# Patient Record
Sex: Female | Born: 1960 | Race: White | Hispanic: No | Marital: Married | Smoking: Current every day smoker
Health system: Southern US, Community
[De-identification: ages and names within clinical notes are randomized; demographics above are authoritative.]

## PROBLEM LIST (undated history)

## (undated) DIAGNOSIS — K219 Gastro-esophageal reflux disease without esophagitis: Secondary | ICD-10-CM

## (undated) DIAGNOSIS — F419 Anxiety disorder, unspecified: Secondary | ICD-10-CM

## (undated) DIAGNOSIS — F329 Major depressive disorder, single episode, unspecified: Secondary | ICD-10-CM

## (undated) DIAGNOSIS — F1010 Alcohol abuse, uncomplicated: Secondary | ICD-10-CM

## (undated) DIAGNOSIS — F32A Depression, unspecified: Secondary | ICD-10-CM

## (undated) DIAGNOSIS — F988 Other specified behavioral and emotional disorders with onset usually occurring in childhood and adolescence: Secondary | ICD-10-CM

## (undated) DIAGNOSIS — Z972 Presence of dental prosthetic device (complete) (partial): Secondary | ICD-10-CM

## (undated) DIAGNOSIS — Z974 Presence of external hearing-aid: Secondary | ICD-10-CM

## (undated) DIAGNOSIS — T17908A Unspecified foreign body in respiratory tract, part unspecified causing other injury, initial encounter: Secondary | ICD-10-CM

## (undated) HISTORY — PX: TUBAL LIGATION: SHX77

## (undated) HISTORY — PX: FOOT SURGERY: SHX648

## (undated) HISTORY — PX: ABDOMINAL HYSTERECTOMY: SHX81

## (undated) HISTORY — PX: TRACHEAL SURGERY: SHX1096

---

## 2013-05-23 ENCOUNTER — Emergency Department: Payer: Self-pay | Admitting: Internal Medicine

## 2013-05-23 ENCOUNTER — Ambulatory Visit: Payer: Self-pay

## 2013-05-23 LAB — COMPREHENSIVE METABOLIC PANEL
ALBUMIN: 3.6 g/dL (ref 3.4–5.0)
ALT: 21 U/L (ref 12–78)
Alkaline Phosphatase: 90 U/L
Anion Gap: 0 — ABNORMAL LOW (ref 7–16)
BILIRUBIN TOTAL: 0.3 mg/dL (ref 0.2–1.0)
BUN: 4 mg/dL — ABNORMAL LOW (ref 7–18)
CALCIUM: 8.6 mg/dL (ref 8.5–10.1)
CO2: 36 mmol/L — AB (ref 21–32)
Chloride: 102 mmol/L (ref 98–107)
Creatinine: 0.74 mg/dL (ref 0.60–1.30)
EGFR (African American): >60
EGFR (Non-African Amer.): >60
GLUCOSE: 79 mg/dL (ref 65–99)
Osmolality: 271 (ref 275–301)
Potassium: 4.3 mmol/L (ref 3.5–5.1)
SGOT(AST): 28 U/L (ref 15–37)
SODIUM: 138 mmol/L (ref 136–145)
TOTAL PROTEIN: 7.2 g/dL (ref 6.4–8.2)

## 2013-05-23 LAB — CK TOTAL AND CKMB (NOT AT ARMC)
CK, Total: 138 U/L
CK-MB: 1.1 ng/mL (ref 0.5–3.6)

## 2013-05-23 LAB — CBC
HCT: 37.5 % (ref 35.0–47.0)
HGB: 12.5 g/dL (ref 12.0–16.0)
MCH: 30.2 pg (ref 26.0–34.0)
MCHC: 33.3 g/dL (ref 32.0–36.0)
MCV: 91 fL (ref 80–100)
Platelet: 186 10*3/uL (ref 150–440)
RBC: 4.14 10*6/uL (ref 3.80–5.20)
RDW: 14.3 % (ref 11.5–14.5)
WBC: 5 10*3/uL (ref 3.6–11.0)

## 2013-07-29 LAB — CBC
HCT: 39 % (ref 35.0–47.0)
HGB: 13.1 g/dL (ref 12.0–16.0)
MCH: 31.9 pg (ref 26.0–34.0)
MCHC: 33.5 g/dL (ref 32.0–36.0)
MCV: 95 fL (ref 80–100)
Platelet: 200 10*3/uL (ref 150–440)
RBC: 4.1 10*6/uL (ref 3.80–5.20)
RDW: 15 % — AB (ref 11.5–14.5)
WBC: 4.5 10*3/uL (ref 3.6–11.0)

## 2013-07-29 LAB — COMPREHENSIVE METABOLIC PANEL
ALBUMIN: 3.8 g/dL (ref 3.4–5.0)
Alkaline Phosphatase: 84 U/L
Anion Gap: 5 — ABNORMAL LOW (ref 7–16)
BILIRUBIN TOTAL: 0.2 mg/dL (ref 0.2–1.0)
BUN: 6 mg/dL — ABNORMAL LOW (ref 7–18)
CALCIUM: 8.3 mg/dL — AB (ref 8.5–10.1)
CO2: 30 mmol/L (ref 21–32)
Chloride: 103 mmol/L (ref 98–107)
Creatinine: 0.67 mg/dL (ref 0.60–1.30)
EGFR (African American): >60
Glucose: 87 mg/dL (ref 65–99)
OSMOLALITY: 273 (ref 275–301)
Potassium: 3.7 mmol/L (ref 3.5–5.1)
SGOT(AST): 35 U/L (ref 15–37)
SGPT (ALT): 18 U/L (ref 12–78)
SODIUM: 138 mmol/L (ref 136–145)
Total Protein: 7.3 g/dL (ref 6.4–8.2)

## 2013-07-29 LAB — DRUG SCREEN, URINE
Amphetamines, Ur Screen: NEGATIVE (ref ?–1000)
BARBITURATES, UR SCREEN: NEGATIVE (ref ?–200)
Benzodiazepine, Ur Scrn: POSITIVE (ref ?–200)
Cannabinoid 50 Ng, Ur ~~LOC~~: NEGATIVE (ref ?–50)
Cocaine Metabolite,Ur ~~LOC~~: NEGATIVE (ref ?–300)
MDMA (Ecstasy)Ur Screen: POSITIVE (ref ?–500)
Methadone, Ur Screen: NEGATIVE (ref ?–300)
Opiate, Ur Screen: NEGATIVE (ref ?–300)
Phencyclidine (PCP) Ur S: NEGATIVE (ref ?–25)
Tricyclic, Ur Screen: NEGATIVE (ref ?–1000)

## 2013-07-29 LAB — URINALYSIS, COMPLETE
Bacteria: NONE SEEN
Bilirubin,UR: NEGATIVE
Blood: NEGATIVE
Glucose,UR: NEGATIVE mg/dL (ref 0–75)
Ketone: NEGATIVE
LEUKOCYTE ESTERASE: NEGATIVE
Nitrite: NEGATIVE
PH: 6 (ref 4.5–8.0)
PROTEIN: NEGATIVE
RBC,UR: <1 /HPF (ref 0–5)
SPECIFIC GRAVITY: 1.001 (ref 1.003–1.030)
Squamous Epithelial: NONE SEEN
WBC UR: NONE SEEN /HPF (ref 0–5)

## 2013-07-29 LAB — SALICYLATE LEVEL: Salicylates, Serum: <1.7 mg/dL

## 2013-07-29 LAB — ETHANOL
ETHANOL LVL: 275 mg/dL
Ethanol %: 0.275 % — ABNORMAL HIGH (ref 0.000–0.080)

## 2013-07-29 LAB — ACETAMINOPHEN LEVEL

## 2013-07-29 LAB — TSH: Thyroid Stimulating Horm: 2.7 u[IU]/mL

## 2013-07-30 ENCOUNTER — Inpatient Hospital Stay: Payer: Self-pay | Admitting: Internal Medicine

## 2013-07-30 LAB — TROPONIN I: Troponin-I: <0.02 ng/mL

## 2013-07-31 LAB — CBC WITH DIFFERENTIAL/PLATELET
BASOS ABS: 0 10*3/uL (ref 0.0–0.1)
Basophil %: 0.2 %
Eosinophil #: 0 10*3/uL (ref 0.0–0.7)
Eosinophil %: 0.1 %
HCT: 37.9 % (ref 35.0–47.0)
HGB: 12.9 g/dL (ref 12.0–16.0)
LYMPHS PCT: 21.1 %
Lymphocyte #: 1.6 10*3/uL (ref 1.0–3.6)
MCH: 32 pg (ref 26.0–34.0)
MCHC: 34.2 g/dL (ref 32.0–36.0)
MCV: 94 fL (ref 80–100)
MONOS PCT: 7.8 %
Monocyte #: 0.6 x10 3/mm (ref 0.2–0.9)
Neutrophil #: 5.4 10*3/uL (ref 1.4–6.5)
Neutrophil %: 70.8 %
PLATELETS: 176 10*3/uL (ref 150–440)
RBC: 4.04 10*6/uL (ref 3.80–5.20)
RDW: 15 % — ABNORMAL HIGH (ref 11.5–14.5)
WBC: 7.6 10*3/uL (ref 3.6–11.0)

## 2013-07-31 LAB — COMPREHENSIVE METABOLIC PANEL
ALK PHOS: 87 U/L
Albumin: 2.9 g/dL — ABNORMAL LOW (ref 3.4–5.0)
Anion Gap: 8 (ref 7–16)
BUN: 7 mg/dL (ref 7–18)
Bilirubin,Total: 0.6 mg/dL (ref 0.2–1.0)
CALCIUM: 8.2 mg/dL — AB (ref 8.5–10.1)
CHLORIDE: 108 mmol/L — AB (ref 98–107)
CREATININE: 0.67 mg/dL (ref 0.60–1.30)
Co2: 24 mmol/L (ref 21–32)
EGFR (African American): >60
EGFR (Non-African Amer.): >60
Glucose: 113 mg/dL — ABNORMAL HIGH (ref 65–99)
Osmolality: 278 (ref 275–301)
POTASSIUM: 3.7 mmol/L (ref 3.5–5.1)
SGOT(AST): 24 U/L (ref 15–37)
SGPT (ALT): 16 U/L (ref 12–78)
Sodium: 140 mmol/L (ref 136–145)
Total Protein: 5.9 g/dL — ABNORMAL LOW (ref 6.4–8.2)

## 2013-07-31 LAB — MAGNESIUM: Magnesium: 1.7 mg/dL — ABNORMAL LOW

## 2013-08-01 LAB — MAGNESIUM: Magnesium: 1.7 mg/dL — ABNORMAL LOW

## 2013-08-01 LAB — PHOSPHORUS: Phosphorus: 2 mg/dL — ABNORMAL LOW (ref 2.5–4.9)

## 2013-08-02 LAB — URINALYSIS, COMPLETE
BLOOD: NEGATIVE
Bacteria: NONE SEEN
Bilirubin,UR: NEGATIVE
Glucose,UR: NEGATIVE mg/dL (ref 0–75)
LEUKOCYTE ESTERASE: NEGATIVE
Nitrite: NEGATIVE
Ph: 6 (ref 4.5–8.0)
Protein: NEGATIVE
Specific Gravity: 1.02 (ref 1.003–1.030)
Squamous Epithelial: NONE SEEN
WBC UR: 1 /HPF (ref 0–5)

## 2013-08-02 LAB — POTASSIUM: POTASSIUM: 2.9 mmol/L — AB (ref 3.5–5.1)

## 2013-08-02 LAB — MAGNESIUM: MAGNESIUM: 1.6 mg/dL — AB

## 2013-08-03 LAB — CBC WITH DIFFERENTIAL/PLATELET
Basophil #: 0 10*3/uL (ref 0.0–0.1)
Basophil %: 0.3 %
EOS ABS: 0 10*3/uL (ref 0.0–0.7)
EOS PCT: 0.6 %
HCT: 33.3 % — AB (ref 35.0–47.0)
HGB: 11.1 g/dL — ABNORMAL LOW (ref 12.0–16.0)
LYMPHS PCT: 12.3 %
Lymphocyte #: 0.8 10*3/uL — ABNORMAL LOW (ref 1.0–3.6)
MCH: 31.1 pg (ref 26.0–34.0)
MCHC: 33.2 g/dL (ref 32.0–36.0)
MCV: 94 fL (ref 80–100)
MONOS PCT: 13 %
Monocyte #: 0.9 x10 3/mm (ref 0.2–0.9)
NEUTROS ABS: 4.9 10*3/uL (ref 1.4–6.5)
NEUTROS PCT: 73.8 %
Platelet: 161 10*3/uL (ref 150–440)
RBC: 3.56 10*6/uL — ABNORMAL LOW (ref 3.80–5.20)
RDW: 14.3 % (ref 11.5–14.5)
WBC: 6.6 10*3/uL (ref 3.6–11.0)

## 2013-08-03 LAB — BASIC METABOLIC PANEL
Anion Gap: 5 — ABNORMAL LOW (ref 7–16)
BUN: 7 mg/dL (ref 7–18)
CALCIUM: 8 mg/dL — AB (ref 8.5–10.1)
CHLORIDE: 106 mmol/L (ref 98–107)
Co2: 25 mmol/L (ref 21–32)
Creatinine: 0.65 mg/dL (ref 0.60–1.30)
EGFR (African American): >60
Glucose: 130 mg/dL — ABNORMAL HIGH (ref 65–99)
Osmolality: 272 (ref 275–301)
Potassium: 3.6 mmol/L (ref 3.5–5.1)
SODIUM: 136 mmol/L (ref 136–145)

## 2013-08-03 LAB — MAGNESIUM: MAGNESIUM: 2 mg/dL

## 2013-08-04 LAB — CBC WITH DIFFERENTIAL/PLATELET
Basophil #: 0 10*3/uL (ref 0.0–0.1)
Basophil %: 0.2 %
EOS ABS: 0.1 10*3/uL (ref 0.0–0.7)
EOS PCT: 1.1 %
HCT: 31.1 % — AB (ref 35.0–47.0)
HGB: 10.4 g/dL — AB (ref 12.0–16.0)
Lymphocyte #: 1 10*3/uL (ref 1.0–3.6)
Lymphocyte %: 15.3 %
MCH: 31.5 pg (ref 26.0–34.0)
MCHC: 33.5 g/dL (ref 32.0–36.0)
MCV: 94 fL (ref 80–100)
MONO ABS: 0.9 x10 3/mm (ref 0.2–0.9)
Monocyte %: 13.2 %
NEUTROS PCT: 70.2 %
Neutrophil #: 4.6 10*3/uL (ref 1.4–6.5)
PLATELETS: 166 10*3/uL (ref 150–440)
RBC: 3.3 10*6/uL — AB (ref 3.80–5.20)
RDW: 14.6 % — AB (ref 11.5–14.5)
WBC: 6.5 10*3/uL (ref 3.6–11.0)

## 2013-08-04 LAB — BASIC METABOLIC PANEL
Anion Gap: 5 — ABNORMAL LOW (ref 7–16)
BUN: 8 mg/dL (ref 7–18)
Calcium, Total: 8.6 mg/dL (ref 8.5–10.1)
Chloride: 104 mmol/L (ref 98–107)
Co2: 26 mmol/L (ref 21–32)
Glucose: 105 mg/dL — ABNORMAL HIGH (ref 65–99)
Osmolality: 269 (ref 275–301)
Potassium: 3.8 mmol/L (ref 3.5–5.1)
SODIUM: 135 mmol/L — AB (ref 136–145)

## 2013-08-04 LAB — CREATININE, SERUM
Creatinine: 0.66 mg/dL (ref 0.60–1.30)
EGFR (Non-African Amer.): >60

## 2013-08-04 LAB — URINE CULTURE

## 2013-08-05 LAB — CBC WITH DIFFERENTIAL/PLATELET
Basophil #: 0 10*3/uL (ref 0.0–0.1)
Basophil %: 0.3 %
Eosinophil #: 0.1 10*3/uL (ref 0.0–0.7)
Eosinophil %: 1.6 %
HCT: 30.7 % — ABNORMAL LOW (ref 35.0–47.0)
HGB: 10.1 g/dL — AB (ref 12.0–16.0)
Lymphocyte #: 1.1 10*3/uL (ref 1.0–3.6)
Lymphocyte %: 18.4 %
MCH: 31.6 pg (ref 26.0–34.0)
MCHC: 33 g/dL (ref 32.0–36.0)
MCV: 96 fL (ref 80–100)
MONO ABS: 0.8 x10 3/mm (ref 0.2–0.9)
MONOS PCT: 14.1 %
Neutrophil #: 3.8 10*3/uL (ref 1.4–6.5)
Neutrophil %: 65.6 %
Platelet: 203 10*3/uL (ref 150–440)
RBC: 3.2 10*6/uL — ABNORMAL LOW (ref 3.80–5.20)
RDW: 14.6 % — ABNORMAL HIGH (ref 11.5–14.5)
WBC: 5.8 10*3/uL (ref 3.6–11.0)

## 2013-08-05 LAB — BASIC METABOLIC PANEL
ANION GAP: 6 — AB (ref 7–16)
BUN: 10 mg/dL (ref 7–18)
Calcium, Total: 9 mg/dL (ref 8.5–10.1)
Chloride: 100 mmol/L (ref 98–107)
Co2: 30 mmol/L (ref 21–32)
Creatinine: 0.57 mg/dL — ABNORMAL LOW (ref 0.60–1.30)
EGFR (Non-African Amer.): >60
GLUCOSE: 107 mg/dL — AB (ref 65–99)
OSMOLALITY: 271 (ref 275–301)
Potassium: 4.2 mmol/L (ref 3.5–5.1)
Sodium: 136 mmol/L (ref 136–145)

## 2013-08-05 LAB — VANCOMYCIN, TROUGH: VANCOMYCIN, TROUGH: 5 ug/mL — AB (ref 10–20)

## 2013-08-06 LAB — EXPECTORATED SPUTUM ASSESSMENT W REFEX TO RESP CULTURE

## 2013-08-07 LAB — CULTURE, BLOOD (SINGLE)

## 2013-08-08 LAB — COMPREHENSIVE METABOLIC PANEL
ALBUMIN: 2.2 g/dL — AB (ref 3.4–5.0)
ANION GAP: 7 (ref 7–16)
Alkaline Phosphatase: 135 U/L — ABNORMAL HIGH
BILIRUBIN TOTAL: 0.3 mg/dL (ref 0.2–1.0)
BUN: 14 mg/dL (ref 7–18)
CHLORIDE: 109 mmol/L — AB (ref 98–107)
CO2: 28 mmol/L (ref 21–32)
Calcium, Total: 8.8 mg/dL (ref 8.5–10.1)
Creatinine: 0.57 mg/dL — ABNORMAL LOW (ref 0.60–1.30)
EGFR (African American): >60
GLUCOSE: 80 mg/dL (ref 65–99)
Osmolality: 286 (ref 275–301)
POTASSIUM: 3.3 mmol/L — AB (ref 3.5–5.1)
SGOT(AST): 33 U/L (ref 15–37)
SGPT (ALT): 31 U/L (ref 12–78)
Sodium: 144 mmol/L (ref 136–145)
TOTAL PROTEIN: 5.7 g/dL — AB (ref 6.4–8.2)

## 2013-08-08 LAB — CBC WITH DIFFERENTIAL/PLATELET
Bands: 3 %
COMMENT - H1-COM3: NORMAL
EOS PCT: 3 %
HCT: 30.6 % — ABNORMAL LOW (ref 35.0–47.0)
HGB: 9.9 g/dL — AB (ref 12.0–16.0)
LYMPHS PCT: 31 %
MCH: 31 pg (ref 26.0–34.0)
MCHC: 32.3 g/dL (ref 32.0–36.0)
MCV: 96 fL (ref 80–100)
METAMYELOCYTE: 4 %
Monocytes: 7 %
Platelet: 273 10*3/uL (ref 150–440)
RBC: 3.19 10*6/uL — AB (ref 3.80–5.20)
RDW: 14.9 % — ABNORMAL HIGH (ref 11.5–14.5)
Segmented Neutrophils: 50 %
VARIANT LYMPHOCYTE - H1-RLYMPH: 2 %
WBC: 6.2 10*3/uL (ref 3.6–11.0)

## 2013-08-09 LAB — CBC WITH DIFFERENTIAL/PLATELET
Bands: 6 %
HCT: 30.7 % — AB (ref 35.0–47.0)
HGB: 10.3 g/dL — AB (ref 12.0–16.0)
LYMPHS PCT: 37 %
MCH: 32 pg (ref 26.0–34.0)
MCHC: 33.4 g/dL (ref 32.0–36.0)
MCV: 96 fL (ref 80–100)
MYELOCYTE: 2 %
Metamyelocyte: 7 %
Monocytes: 4 %
NRBC/100 WBC: 1 /
PLATELETS: 283 10*3/uL (ref 150–440)
RBC: 3.21 10*6/uL — AB (ref 3.80–5.20)
RDW: 14.8 % — AB (ref 11.5–14.5)
Segmented Neutrophils: 44 %
WBC: 6.4 10*3/uL (ref 3.6–11.0)

## 2013-08-09 LAB — BASIC METABOLIC PANEL
ANION GAP: 6 — AB (ref 7–16)
BUN: 13 mg/dL (ref 7–18)
CALCIUM: 8.4 mg/dL — AB (ref 8.5–10.1)
CO2: 25 mmol/L (ref 21–32)
Chloride: 112 mmol/L — ABNORMAL HIGH (ref 98–107)
Creatinine: 0.7 mg/dL (ref 0.60–1.30)
EGFR (African American): >60
EGFR (Non-African Amer.): >60
GLUCOSE: 85 mg/dL (ref 65–99)
Osmolality: 284 (ref 275–301)
POTASSIUM: 3.4 mmol/L — AB (ref 3.5–5.1)
Sodium: 143 mmol/L (ref 136–145)

## 2013-08-10 LAB — BASIC METABOLIC PANEL
Anion Gap: 9 (ref 7–16)
BUN: 18 mg/dL (ref 7–18)
CHLORIDE: 107 mmol/L (ref 98–107)
CREATININE: 0.64 mg/dL (ref 0.60–1.30)
Calcium, Total: 8.7 mg/dL (ref 8.5–10.1)
Co2: 25 mmol/L (ref 21–32)
GLUCOSE: 95 mg/dL (ref 65–99)
Osmolality: 283 (ref 275–301)
Potassium: 3.5 mmol/L (ref 3.5–5.1)
Sodium: 141 mmol/L (ref 136–145)

## 2013-08-10 LAB — PHOSPHORUS: PHOSPHORUS: 3.5 mg/dL (ref 2.5–4.9)

## 2013-08-10 LAB — TRIGLYCERIDES: Triglycerides: 224 mg/dL — ABNORMAL HIGH (ref 0–200)

## 2013-08-10 LAB — MAGNESIUM: MAGNESIUM: 2.1 mg/dL

## 2013-08-11 LAB — PHOSPHORUS: Phosphorus: 3.3 mg/dL (ref 2.5–4.9)

## 2013-08-11 LAB — BASIC METABOLIC PANEL
Anion Gap: 8 (ref 7–16)
BUN: 16 mg/dL (ref 7–18)
Calcium, Total: 8.9 mg/dL (ref 8.5–10.1)
Chloride: 105 mmol/L (ref 98–107)
Co2: 24 mmol/L (ref 21–32)
Creatinine: 0.81 mg/dL (ref 0.60–1.30)
EGFR (African American): >60
EGFR (Non-African Amer.): >60
Glucose: 96 mg/dL (ref 65–99)
Osmolality: 275 (ref 275–301)
Potassium: 3.4 mmol/L — ABNORMAL LOW (ref 3.5–5.1)
Sodium: 137 mmol/L (ref 136–145)

## 2013-08-11 LAB — CBC WITH DIFFERENTIAL/PLATELET
Basophil #: 0 10*3/uL (ref 0.0–0.1)
Basophil %: 0.2 %
Eosinophil #: 0.1 10*3/uL (ref 0.0–0.7)
Eosinophil %: 0.8 %
HCT: 35.7 % (ref 35.0–47.0)
HGB: 11.6 g/dL — ABNORMAL LOW (ref 12.0–16.0)
Lymphocyte #: 1.5 10*3/uL (ref 1.0–3.6)
Lymphocyte %: 15.5 %
MCH: 31 pg (ref 26.0–34.0)
MCHC: 32.6 g/dL (ref 32.0–36.0)
MCV: 95 fL (ref 80–100)
MONOS PCT: 4.9 %
Monocyte #: 0.5 x10 3/mm (ref 0.2–0.9)
NEUTROS PCT: 78.6 %
Neutrophil #: 7.7 10*3/uL — ABNORMAL HIGH (ref 1.4–6.5)
PLATELETS: 333 10*3/uL (ref 150–440)
RBC: 3.74 10*6/uL — AB (ref 3.80–5.20)
RDW: 14.9 % — ABNORMAL HIGH (ref 11.5–14.5)
WBC: 9.8 10*3/uL (ref 3.6–11.0)

## 2013-08-11 LAB — URINALYSIS, COMPLETE
BLOOD: NEGATIVE
Bacteria: NONE SEEN
Bilirubin,UR: NEGATIVE
Glucose,UR: NEGATIVE mg/dL (ref 0–75)
KETONE: NEGATIVE
Leukocyte Esterase: NEGATIVE
Nitrite: NEGATIVE
PROTEIN: NEGATIVE
Ph: 7 (ref 4.5–8.0)
RBC,UR: 1 /HPF (ref 0–5)
Specific Gravity: 1.015 (ref 1.003–1.030)
Squamous Epithelial: <1
WBC UR: 1 /HPF (ref 0–5)

## 2013-08-11 LAB — MAGNESIUM: Magnesium: 2.1 mg/dL

## 2013-08-12 LAB — CBC WITH DIFFERENTIAL/PLATELET
BASOS ABS: 0 10*3/uL (ref 0.0–0.1)
BASOS PCT: 0.4 %
EOS PCT: 1.3 %
Eosinophil #: 0.1 10*3/uL (ref 0.0–0.7)
HCT: 32.9 % — AB (ref 35.0–47.0)
HGB: 11 g/dL — AB (ref 12.0–16.0)
Lymphocyte #: 1.7 10*3/uL (ref 1.0–3.6)
Lymphocyte %: 19.3 %
MCH: 31.7 pg (ref 26.0–34.0)
MCHC: 33.4 g/dL (ref 32.0–36.0)
MCV: 95 fL (ref 80–100)
Monocyte #: 0.6 x10 3/mm (ref 0.2–0.9)
Monocyte %: 6.4 %
Neutrophil #: 6.5 10*3/uL (ref 1.4–6.5)
Neutrophil %: 72.6 %
Platelet: 322 10*3/uL (ref 150–440)
RBC: 3.46 10*6/uL — AB (ref 3.80–5.20)
RDW: 14.9 % — AB (ref 11.5–14.5)
WBC: 8.9 10*3/uL (ref 3.6–11.0)

## 2013-08-12 LAB — BASIC METABOLIC PANEL
Anion Gap: 5 — ABNORMAL LOW (ref 7–16)
BUN: 15 mg/dL (ref 7–18)
CALCIUM: 8.9 mg/dL (ref 8.5–10.1)
CHLORIDE: 107 mmol/L (ref 98–107)
CO2: 24 mmol/L (ref 21–32)
CREATININE: 0.68 mg/dL (ref 0.60–1.30)
GLUCOSE: 85 mg/dL (ref 65–99)
Osmolality: 272 (ref 275–301)
Potassium: 3.6 mmol/L (ref 3.5–5.1)
SODIUM: 136 mmol/L (ref 136–145)

## 2013-08-12 LAB — PHOSPHORUS: PHOSPHORUS: 3.3 mg/dL (ref 2.5–4.9)

## 2013-08-12 LAB — MAGNESIUM: MAGNESIUM: 2 mg/dL

## 2013-08-13 LAB — CBC WITH DIFFERENTIAL/PLATELET
BASOS ABS: 0 10*3/uL (ref 0.0–0.1)
BASOS PCT: 0.4 %
EOS ABS: 0.2 10*3/uL (ref 0.0–0.7)
EOS PCT: 2.5 %
HCT: 31.7 % — AB (ref 35.0–47.0)
HGB: 10.6 g/dL — ABNORMAL LOW (ref 12.0–16.0)
LYMPHS ABS: 1.5 10*3/uL (ref 1.0–3.6)
LYMPHS PCT: 21.1 %
MCH: 31.6 pg (ref 26.0–34.0)
MCHC: 33.6 g/dL (ref 32.0–36.0)
MCV: 94 fL (ref 80–100)
MONO ABS: 0.5 x10 3/mm (ref 0.2–0.9)
MONOS PCT: 6.8 %
NEUTROS PCT: 69.2 %
Neutrophil #: 4.9 10*3/uL (ref 1.4–6.5)
PLATELETS: 317 10*3/uL (ref 150–440)
RBC: 3.37 10*6/uL — AB (ref 3.80–5.20)
RDW: 15 % — ABNORMAL HIGH (ref 11.5–14.5)
WBC: 7.1 10*3/uL (ref 3.6–11.0)

## 2013-08-13 LAB — BASIC METABOLIC PANEL
Anion Gap: 6 — ABNORMAL LOW (ref 7–16)
BUN: 15 mg/dL (ref 7–18)
CALCIUM: 8.5 mg/dL (ref 8.5–10.1)
CHLORIDE: 106 mmol/L (ref 98–107)
Co2: 24 mmol/L (ref 21–32)
Creatinine: 0.69 mg/dL (ref 0.60–1.30)
EGFR (Non-African Amer.): >60
Glucose: 84 mg/dL (ref 65–99)
Osmolality: 272 (ref 275–301)
Potassium: 3.4 mmol/L — ABNORMAL LOW (ref 3.5–5.1)
SODIUM: 136 mmol/L (ref 136–145)

## 2013-08-13 LAB — MAGNESIUM: MAGNESIUM: 1.8 mg/dL

## 2013-08-13 LAB — PHOSPHORUS: PHOSPHORUS: 4.4 mg/dL (ref 2.5–4.9)

## 2013-08-14 LAB — CBC WITH DIFFERENTIAL/PLATELET
BASOS PCT: 0.3 %
Basophil #: 0 10*3/uL (ref 0.0–0.1)
Eosinophil #: 0.1 10*3/uL (ref 0.0–0.7)
Eosinophil %: 2 %
HCT: 30.4 % — ABNORMAL LOW (ref 35.0–47.0)
HGB: 10.5 g/dL — ABNORMAL LOW (ref 12.0–16.0)
LYMPHS PCT: 25.8 %
Lymphocyte #: 1.6 10*3/uL (ref 1.0–3.6)
MCH: 32.1 pg (ref 26.0–34.0)
MCHC: 34.5 g/dL (ref 32.0–36.0)
MCV: 93 fL (ref 80–100)
MONO ABS: 0.5 x10 3/mm (ref 0.2–0.9)
Monocyte %: 7.9 %
Neutrophil #: 3.8 10*3/uL (ref 1.4–6.5)
Neutrophil %: 64 %
Platelet: 330 10*3/uL (ref 150–440)
RBC: 3.26 10*6/uL — ABNORMAL LOW (ref 3.80–5.20)
RDW: 14.7 % — ABNORMAL HIGH (ref 11.5–14.5)
WBC: 6 10*3/uL (ref 3.6–11.0)

## 2013-08-14 LAB — BASIC METABOLIC PANEL
Anion Gap: 10 (ref 7–16)
BUN: 12 mg/dL (ref 7–18)
Calcium, Total: 8.5 mg/dL (ref 8.5–10.1)
Chloride: 103 mmol/L (ref 98–107)
Co2: 24 mmol/L (ref 21–32)
Creatinine: 0.41 mg/dL — ABNORMAL LOW (ref 0.60–1.30)
EGFR (African American): >60
EGFR (Non-African Amer.): >60
Glucose: 109 mg/dL — ABNORMAL HIGH (ref 65–99)
Osmolality: 274 (ref 275–301)
POTASSIUM: 3.3 mmol/L — AB (ref 3.5–5.1)
Sodium: 137 mmol/L (ref 136–145)

## 2013-08-14 LAB — PHOSPHORUS: Phosphorus: 3.4 mg/dL (ref 2.5–4.9)

## 2013-08-14 LAB — MAGNESIUM: Magnesium: 1.9 mg/dL

## 2013-08-15 LAB — BASIC METABOLIC PANEL
Anion Gap: 9 (ref 7–16)
BUN: 10 mg/dL (ref 7–18)
CHLORIDE: 105 mmol/L (ref 98–107)
Calcium, Total: 8.8 mg/dL (ref 8.5–10.1)
Co2: 23 mmol/L (ref 21–32)
Creatinine: 0.51 mg/dL — ABNORMAL LOW (ref 0.60–1.30)
Glucose: 109 mg/dL — ABNORMAL HIGH (ref 65–99)
Osmolality: 273 (ref 275–301)
Potassium: 3.3 mmol/L — ABNORMAL LOW (ref 3.5–5.1)
SODIUM: 137 mmol/L (ref 136–145)

## 2013-08-15 LAB — POTASSIUM: Potassium: 3.8 mmol/L (ref 3.5–5.1)

## 2013-08-15 LAB — MAGNESIUM: Magnesium: 1.9 mg/dL

## 2013-08-16 ENCOUNTER — Ambulatory Visit (HOSPITAL_COMMUNITY)
Admission: AD | Admit: 2013-08-16 | Discharge: 2013-08-16 | Disposition: A | Discharge status: Home / Self Care | Payer: Self-pay | Source: Other Acute Inpatient Hospital | Attending: Internal Medicine | Admitting: Internal Medicine

## 2013-08-16 ENCOUNTER — Inpatient Hospital Stay
Admission: AD | Admit: 2013-08-16 | Discharge: 2013-09-03 | Disposition: A | Discharge status: Home / Self Care | Payer: Self-pay | Source: Ambulatory Visit | Attending: Internal Medicine | Admitting: Internal Medicine

## 2013-08-16 DIAGNOSIS — F1020 Alcohol dependence, uncomplicated: Secondary | ICD-10-CM | POA: Insufficient documentation

## 2013-08-16 DIAGNOSIS — J9600 Acute respiratory failure, unspecified whether with hypoxia or hypercapnia: Secondary | ICD-10-CM | POA: Insufficient documentation

## 2013-08-16 LAB — PHOSPHORUS: Phosphorus: 3.7 mg/dL (ref 2.5–4.9)

## 2013-08-16 LAB — BASIC METABOLIC PANEL
Anion Gap: 8 (ref 7–16)
BUN: 12 mg/dL (ref 7–18)
CALCIUM: 9.2 mg/dL (ref 8.5–10.1)
CHLORIDE: 104 mmol/L (ref 98–107)
CREATININE: 0.6 mg/dL (ref 0.60–1.30)
Co2: 23 mmol/L (ref 21–32)
Glucose: 87 mg/dL (ref 65–99)
Osmolality: 269 (ref 275–301)
POTASSIUM: 3.5 mmol/L (ref 3.5–5.1)
SODIUM: 135 mmol/L — AB (ref 136–145)

## 2013-08-16 LAB — CBC WITH DIFFERENTIAL/PLATELET
BASOS PCT: 1.5 %
Basophil #: 0.2 10*3/uL — ABNORMAL HIGH (ref 0.0–0.1)
Eosinophil #: 0.1 10*3/uL (ref 0.0–0.7)
Eosinophil %: 0.6 %
HCT: 35.9 % (ref 35.0–47.0)
HGB: 12.1 g/dL (ref 12.0–16.0)
LYMPHS PCT: 14 %
Lymphocyte #: 1.5 10*3/uL (ref 1.0–3.6)
MCH: 31.5 pg (ref 26.0–34.0)
MCHC: 33.6 g/dL (ref 32.0–36.0)
MCV: 94 fL (ref 80–100)
Monocyte #: 0.7 x10 3/mm (ref 0.2–0.9)
Monocyte %: 7 %
NEUTROS PCT: 76.9 %
Neutrophil #: 8 10*3/uL — ABNORMAL HIGH (ref 1.4–6.5)
Platelet: 448 10*3/uL — ABNORMAL HIGH (ref 150–440)
RBC: 3.84 10*6/uL (ref 3.80–5.20)
RDW: 14.7 % — ABNORMAL HIGH (ref 11.5–14.5)
WBC: 10.4 10*3/uL (ref 3.6–11.0)

## 2013-08-16 LAB — TRIGLYCERIDES: Triglycerides: 197 mg/dL (ref 0–200)

## 2013-08-16 LAB — MAGNESIUM: Magnesium: 1.8 mg/dL

## 2013-08-17 ENCOUNTER — Other Ambulatory Visit (HOSPITAL_COMMUNITY): Payer: Self-pay

## 2013-08-17 LAB — COMPREHENSIVE METABOLIC PANEL
ALBUMIN: 3.2 g/dL — AB (ref 3.5–5.2)
ALK PHOS: 196 U/L — AB (ref 39–117)
ALT: 61 U/L — AB (ref 0–35)
AST: 90 U/L — ABNORMAL HIGH (ref 0–37)
BILIRUBIN TOTAL: 0.3 mg/dL (ref 0.3–1.2)
BUN: 13 mg/dL (ref 6–23)
CHLORIDE: 96 meq/L (ref 96–112)
CO2: 24 mEq/L (ref 19–32)
Calcium: 10.1 mg/dL (ref 8.4–10.5)
Creatinine, Ser: 0.51 mg/dL (ref 0.50–1.10)
GFR calc Af Amer: >90 mL/min (ref >90)
GFR calc non Af Amer: >90 mL/min (ref >90)
Glucose, Bld: 98 mg/dL (ref 70–99)
POTASSIUM: 3.9 meq/L (ref 3.7–5.3)
SODIUM: 137 meq/L (ref 137–147)
Total Protein: 7.9 g/dL (ref 6.0–8.3)

## 2013-08-17 LAB — HEMOGLOBIN A1C
Hgb A1c MFr Bld: 5.2 % (ref <5.7)
Mean Plasma Glucose: 103 mg/dL (ref <117)

## 2013-08-17 LAB — BLOOD GAS, ARTERIAL
Acid-Base Excess: 0.9 mmol/L (ref 0.0–2.0)
Bicarbonate: 24.6 mEq/L — ABNORMAL HIGH (ref 20.0–24.0)
FIO2: 30 %
O2 Saturation: 99.7 %
PEEP: 5 cmH2O
PH ART: 7.455 — AB (ref 7.350–7.450)
Patient temperature: 97.7
RATE: 12 resp/min
TCO2: 25.7 mmol/L (ref 0–100)
VT: 500 mL
pCO2 arterial: 35.2 mmHg (ref 35.0–45.0)
pO2, Arterial: 141 mmHg — ABNORMAL HIGH (ref 80.0–100.0)

## 2013-08-17 LAB — TSH: TSH: 1.67 u[IU]/mL (ref 0.350–4.500)

## 2013-08-17 LAB — CBC WITH DIFFERENTIAL/PLATELET
BASOS ABS: 0 10*3/uL (ref 0.0–0.1)
BASOS PCT: 0 % (ref 0–1)
Eosinophils Absolute: 0.1 10*3/uL (ref 0.0–0.7)
Eosinophils Relative: 1 % (ref 0–5)
HCT: 33.3 % — ABNORMAL LOW (ref 36.0–46.0)
Hemoglobin: 11.1 g/dL — ABNORMAL LOW (ref 12.0–15.0)
Lymphocytes Relative: 22 % (ref 12–46)
Lymphs Abs: 1.9 10*3/uL (ref 0.7–4.0)
MCH: 30.8 pg (ref 26.0–34.0)
MCHC: 33.3 g/dL (ref 30.0–36.0)
MCV: 92.5 fL (ref 78.0–100.0)
Monocytes Absolute: 0.8 10*3/uL (ref 0.1–1.0)
Monocytes Relative: 9 % (ref 3–12)
NEUTROS ABS: 6.2 10*3/uL (ref 1.7–7.7)
NEUTROS PCT: 68 % (ref 43–77)
Platelets: 447 10*3/uL — ABNORMAL HIGH (ref 150–400)
RBC: 3.6 MIL/uL — ABNORMAL LOW (ref 3.87–5.11)
RDW: 13.9 % (ref 11.5–15.5)
WBC: 9 10*3/uL (ref 4.0–10.5)

## 2013-08-17 LAB — PHOSPHORUS: Phosphorus: 4.2 mg/dL (ref 2.3–4.6)

## 2013-08-17 LAB — PROCALCITONIN

## 2013-08-17 LAB — MAGNESIUM: MAGNESIUM: 2.1 mg/dL (ref 1.5–2.5)

## 2013-08-17 LAB — GAMMA GT: GGT: 195 U/L — ABNORMAL HIGH (ref 7–51)

## 2013-08-17 LAB — VITAMIN B12: Vitamin B-12: 697 pg/mL (ref 211–911)

## 2013-08-18 LAB — C-REACTIVE PROTEIN: CRP: 9.8 mg/dL — AB (ref <0.60)

## 2013-08-18 LAB — IRON AND TIBC
Iron: 44 ug/dL (ref 42–135)
Saturation Ratios: 16 % — ABNORMAL LOW (ref 20–55)
TIBC: 271 ug/dL (ref 250–470)
UIBC: 227 ug/dL (ref 125–400)

## 2013-08-19 ENCOUNTER — Other Ambulatory Visit (HOSPITAL_COMMUNITY): Payer: Self-pay

## 2013-08-19 NOTE — Progress Notes (Signed)
[facility]                                                                                              Progress note     Patient Demographics  Patricia [name], is a 53 y.o. female  ZOX:[id number]  WJX:[id number]  DOB - 10/24/1960  Admit date - 08/16/2013  Admitting Physician Marnette BurgessEarl Webster, MD  Outpatient Primary MD for the patient is No PCP Per Patient  LOS - 3   Chief complaint   Respiratory failure   Alcoholism     Subjective:   Skya Crumpacker confused  Objective:   Vital signs  Temperature 99.2 Heart rate 94 Respiratory rate 14 Blood pressure 84/47 Pulse ox 99%    Exam Drowsy  .AT,PERRAL Supple Neck,No JVD, No cervical lymphadenopathy appreciated. Tracheostomy midline  NG tube noted Symmetrical Chest wall movement, decreased breath sounds bilaterally RRR,No Gallops,Rubs or new Murmurs, No Parasternal Heave . +ve B.Sounds, Abd Soft, Non tender, No organomegaly appriciated, No rebound - guarding or rigidity. No Cyanosis, Clubbing or edema, No new Rash or bruise     I&Os 2575/97.5   Data Review   CBC  Recent Labs Lab 08/17/13 0600  WBC 9.0  HGB 11.1*  HCT 33.3*  PLT 447*  MCV 92.5  MCH 30.8  MCHC 33.3  RDW 13.9  LYMPHSABS 1.9  MONOABS 0.8  EOSABS 0.1  BASOSABS 0.0    Chemistries   Recent Labs Lab 08/17/13 0600  NA 137  K 3.9  CL 96  CO2 24  GLUCOSE 98  BUN 13  CREATININE 0.51  CALCIUM 10.1  MG 2.1  AST 90*  ALT 61*  ALKPHOS 196*  BILITOT 0.3   ------------------------------------------------------------------------------------------------------------------ CrCl is unknown because there is no height on file for the current visit. ------------------------------------------------------------------------------------------------------------------  Recent Labs  08/17/13 0600  HGBA1C 5.2    ------------------------------------------------------------------------------------------------------------------ No results found for this basename: CHOL, HDL, LDLCALC, TRIG, CHOLHDL, LDLDIRECT,  in the last 72 hours ------------------------------------------------------------------------------------------------------------------  Recent Labs  08/17/13 0600  TSH 1.670   ------------------------------------------------------------------------------------------------------------------  Recent Labs  08/17/13 0600  VITAMINB12 697  TIBC 271  IRON 44    Coagulation profile No results found for this basename: INR, PROTIME,  in the last 168 hours  No results found for this basename: DDIMER,  in the last 72 hours  Cardiac Enzymes No results found for this basename: CK, CKMB, TROPONINI, MYOGLOBIN,  in the last 168 hours ------------------------------------------------------------------------------------------------------------------ No components found with this basename: POCBNP,   Micro Results No results found for this or any previous visit (from the past 240 hour(s)).     Assessment & Plan    Ventilator dependent Respiratory failure; continue with PSV trials Encephalopathy multifactorial, with agitation, history of polysubstance abuse Tachycardia on Lopressor when necessary and scheduled Low-grade fever; will monitor check chest x-ray in a.m. Dysphagia/protein calorie malnutrition on NG tube feeding Generalized weakness; PT/OT evaluation and treatment Chronic pain syndrome on pain medications Polysubstance abuse especially alcohol Anxiety and depression History of aspiration pneumonia/MSSA status post treatment  Plan  Hold fentanyl drip Fentanyl when necessary Check chest x-ray in a.m.  Continue same treatment   Code Status: Full  Family Communication: Bedside   DVT Prophylaxis  Lovenox    Carron CurieHijazi, Ali M.D on 08/19/2013 at 1:39 PM

## 2013-08-20 ENCOUNTER — Other Ambulatory Visit (HOSPITAL_COMMUNITY): Payer: Self-pay

## 2013-08-20 NOTE — Progress Notes (Addendum)
[facility]                                                                                              Progress note     Patient Demographics  Patricia [name], is a 53 y.o. female  ZOX:[id number]  WJX:[id number]  DOB - 06/20/1960  Admit date - 08/16/2013  Admitting Physician Patricia BurgessEarl Webster, MD  Outpatient Primary MD for the patient is No PCP Per Patient  LOS - 4   Chief complaint   Respiratory failure   Alcoholism     Subjective:   Patricia [name] confused  Objective:   Vital signs  Temperature  98.1  Heart rate  113  Respiratory rate  17  Blood pressure  142/98  Pulse ox 99%    Exam Drowsy  Patricia [name].AT,PERRAL Supple Neck,No JVD, No cervical lymphadenopathy appreciated. Tracheostomy midline  NG tube noted Symmetrical Chest wall movement, decreased breath sounds bilaterally RRR,No Gallops,Rubs or new Murmurs, No Parasternal Heave . +ve B.Sounds, Abd Soft, Non tender, No organomegaly appriciated, No rebound - guarding or rigidity. No Cyanosis, Clubbing or edema, No new Rash or bruise     I&Os  -650    Data Review   CBC  Recent Labs Lab 08/17/13 0600  WBC 9.0  HGB 11.1*  HCT 33.3*  PLT 447*  MCV 92.5  MCH 30.8  MCHC 33.3  RDW 13.9  LYMPHSABS 1.9  MONOABS 0.8  EOSABS 0.1  BASOSABS 0.0    Chemistries   Recent Labs Lab 08/17/13 0600  NA 137  K 3.9  CL 96  CO2 24  GLUCOSE 98  BUN 13  CREATININE 0.51  CALCIUM 10.1  MG 2.1  AST 90*  ALT 61*  ALKPHOS 196*  BILITOT 0.3   ------------------------------------------------------------------------------------------------------------------ CrCl is unknown because there is no height on file for the current visit. ------------------------------------------------------------------------------------------------------------------ No results found for this basename: HGBA1C,  in the last 72  hours ------------------------------------------------------------------------------------------------------------------ No results found for this basename: CHOL, HDL, LDLCALC, TRIG, CHOLHDL, LDLDIRECT,  in the last 72 hours ------------------------------------------------------------------------------------------------------------------ No results found for this basename: TSH, T4TOTAL, FREET3, T3FREE, THYROIDAB,  in the last 72 hours ------------------------------------------------------------------------------------------------------------------ No results found for this basename: VITAMINB12, FOLATE, FERRITIN, TIBC, IRON, RETICCTPCT,  in the last 72 hours  Coagulation profile No results found for this basename: INR, PROTIME,  in the last 168 hours  No results found for this basename: DDIMER,  in the last 72 hours  Cardiac Enzymes No results found for this basename: CK, CKMB, TROPONINI, MYOGLOBIN,  in the last 168 hours ------------------------------------------------------------------------------------------------------------------ No components found with this basename: POCBNP,   Micro Results No results found for this or any previous visit (from the past 240 hour(s)).     Assessment & Plan    Ventilator dependent Respiratory failure; continue with PSV trials Encephalopathy multifactorial, with agitation, history of polysubstance abuse Tachycardia on Lopressor when necessary and scheduled Low-grade fever; will monitor check chest x-ray in a.m. Dysphagia/protein calorie malnutrition on NG tube feeding Generalized weakness; PT/OT evaluation and treatment Chronic pain syndrome on pain medications Polysubstance abuse especially alcohol Anxiety and depression with agitation  on fentanyl, Versed and Haldol when necessary History of aspiration pneumonia/MSSA status post treatment  Plan  Discontinue Abilify Start Seroquel 50 mg each bedtime and 25 mg during the day Check labs ,  chest x-ray in the morning  Code Status: Full  Family Communication: Bedside   DVT Prophylaxis  Lovenox    Carron CurieHijazi, Aneesha Holloran M.D on 08/20/2013 at 1:16 PM

## 2013-08-21 LAB — CULTURE, BLOOD (SINGLE)

## 2013-08-21 NOTE — Progress Notes (Addendum)
[facility]                                                                                              Progress note     Patient Demographics  Patricia [name], is a 53 y.o. female  ZOX:[id number]  WJX:[id number]  DOB - 08/17/1960  Admit date - 08/16/2013  Admitting Physician Marnette BurgessEarl Webster, MD  Outpatient Primary MD for the patient is No PCP Per Patient  LOS - 5   Chief complaint   Respiratory failure   Alcoholism     Subjective:   Patricia [name] confused  Objective:   Vital signs  Temperature  96.3  Heart rate  87  Respiratory rate  15  Blood pressure  120/72 Pulse ox 100%    Exam Drowsy  [place].AT,PERRAL Supple Neck,No JVD, No cervical lymphadenopathy appreciated. Tracheostomy midline  NG tube noted Symmetrical Chest wall movement, decreased breath sounds bilaterally RRR,No Gallops,Rubs or new Murmurs, No Parasternal Heave . +ve B.Sounds, Abd Soft, Non tender, No organomegaly appriciated, No rebound - guarding or rigidity. No Cyanosis, Clubbing or edema, No new Rash or bruise     I&Os  2310/850    Data Review   CBC  Recent Labs Lab 08/17/13 0600  WBC 9.0  HGB 11.1*  HCT 33.3*  PLT 447*  MCV 92.5  MCH 30.8  MCHC 33.3  RDW 13.9  LYMPHSABS 1.9  MONOABS 0.8  EOSABS 0.1  BASOSABS 0.0    Chemistries   Recent Labs Lab 08/17/13 0600  NA 137  K 3.9  CL 96  CO2 24  GLUCOSE 98  BUN 13  CREATININE 0.51  CALCIUM 10.1  MG 2.1  AST 90*  ALT 61*  ALKPHOS 196*  BILITOT 0.3   ------------------------------------------------------------------------------------------------------------------ CrCl is unknown because there is no height on file for the current visit. ------------------------------------------------------------------------------------------------------------------ No results found for this basename: HGBA1C,  in the last 72  hours ------------------------------------------------------------------------------------------------------------------ No results found for this basename: CHOL, HDL, LDLCALC, TRIG, CHOLHDL, LDLDIRECT,  in the last 72 hours ------------------------------------------------------------------------------------------------------------------ No results found for this basename: TSH, T4TOTAL, FREET3, T3FREE, THYROIDAB,  in the last 72 hours ------------------------------------------------------------------------------------------------------------------ No results found for this basename: VITAMINB12, FOLATE, FERRITIN, TIBC, IRON, RETICCTPCT,  in the last 72 hours  Coagulation profile No results found for this basename: INR, PROTIME,  in the last 168 hours  No results found for this basename: DDIMER,  in the last 72 hours  Cardiac Enzymes No results found for this basename: CK, CKMB, TROPONINI, MYOGLOBIN,  in the last 168 hours ------------------------------------------------------------------------------------------------------------------ No components found with this basename: POCBNP,   Micro Results No results found for this or any previous visit (from the past 240 hour(s)).     Assessment & Plan    Ventilator dependent Respiratory failure; continue with PSV trials Encephalopathy multifactorial, with agitation, history of polysubstance abuse Tachycardia on Lopressor when necessary and scheduled Low-grade fever; will monitor check chest x-ray in a.m. Dysphagia/protein calorie malnutrition on NG tube feeding Generalized weakness; PT/OT evaluation and treatment Chronic pain syndrome on pain medications Polysubstance abuse especially alcohol Anxiety and depression with agitation on  fentanyl, Versed and Haldol when necessary History of aspiration pneumonia/MSSA status post treatment  Plan  Check labs chest x-ray in a.m. Continue with weaning trials Ventilator management  today Critical care time; 37 minutes Code Status: Full  Family Communication: Bedside   DVT Prophylaxis  Lovenox    Carron CurieHijazi, Ali M.D on 08/21/2013 at 1:08 PM

## 2013-08-22 ENCOUNTER — Other Ambulatory Visit (HOSPITAL_COMMUNITY): Payer: Self-pay

## 2013-08-22 LAB — CBC
HCT: 31.4 % — ABNORMAL LOW (ref 36.0–46.0)
Hemoglobin: 10.1 g/dL — ABNORMAL LOW (ref 12.0–15.0)
MCH: 30.9 pg (ref 26.0–34.0)
MCHC: 32.2 g/dL (ref 30.0–36.0)
MCV: 96 fL (ref 78.0–100.0)
Platelets: 283 10*3/uL (ref 150–400)
RBC: 3.27 MIL/uL — ABNORMAL LOW (ref 3.87–5.11)
RDW: 13.6 % (ref 11.5–15.5)
WBC: 7.7 10*3/uL (ref 4.0–10.5)

## 2013-08-22 LAB — BASIC METABOLIC PANEL
BUN: 11 mg/dL (ref 6–23)
CO2: 24 mEq/L (ref 19–32)
CREATININE: 0.37 mg/dL — AB (ref 0.50–1.10)
Calcium: 8.1 mg/dL — ABNORMAL LOW (ref 8.4–10.5)
Chloride: 103 mEq/L (ref 96–112)
GFR calc Af Amer: >90 mL/min (ref >90)
Glucose, Bld: 99 mg/dL (ref 70–99)
Potassium: 2.7 mEq/L — CL (ref 3.7–5.3)
SODIUM: 141 meq/L (ref 137–147)

## 2013-08-22 NOTE — Progress Notes (Signed)
[facility]                                                                                              Progress note     Patient Demographics  Patricia [name], is a 53 y.o. female  ZOX:[id number]  WJX:[id number]  DOB - 09/08/1960  Admit date - 08/16/2013  Admitting Physician Marnette BurgessEarl Webster, MD  Outpatient Primary MD for the patient is No PCP Per Patient  LOS - 6   Chief complaint   Respiratory failure   Alcoholism     Subjective:   Sandeep Deihl confused  Objective:   Vital signs  Temperature  97.9 Heart rate 71  Respiratory rate  17  Blood pressure  111/71 Pulse ox 95%    Exam Drowsy  [place].AT,PERRAL Supple Neck,No JVD, No cervical lymphadenopathy appreciated. Tracheostomy midline  NG tube noted Symmetrical Chest wall movement, decreased breath sounds bilaterally RRR,No Gallops,Rubs or new Murmurs, No Parasternal Heave . +ve B.Sounds, Abd Soft, Non tender, No organomegaly appriciated, No rebound - guarding or rigidity. No Cyanosis, Clubbing or edema, No new Rash or bruise     I&Os  1865/800    Data Review   CBC  Recent Labs Lab 08/17/13 0600 08/22/13 0550  WBC 9.0 7.7  HGB 11.1* 10.1*  HCT 33.3* 31.4*  PLT 447* 283  MCV 92.5 96.0  MCH 30.8 30.9  MCHC 33.3 32.2  RDW 13.9 13.6  LYMPHSABS 1.9  --   MONOABS 0.8  --   EOSABS 0.1  --   BASOSABS 0.0  --     Chemistries   Recent Labs Lab 08/17/13 0600 08/22/13 0550  NA 137 141  K 3.9 2.7*  CL 96 103  CO2 24 24  GLUCOSE 98 99  BUN 13 11  CREATININE 0.51 0.37*  CALCIUM 10.1 8.1*  MG 2.1  --   AST 90*  --   ALT 61*  --   ALKPHOS 196*  --   BILITOT 0.3  --    ------------------------------------------------------------------------------------------------------------------ CrCl is unknown because there is no height on file for the current  visit. ------------------------------------------------------------------------------------------------------------------ No results found for this basename: HGBA1C,  in the last 72 hours ------------------------------------------------------------------------------------------------------------------ No results found for this basename: CHOL, HDL, LDLCALC, TRIG, CHOLHDL, LDLDIRECT,  in the last 72 hours ------------------------------------------------------------------------------------------------------------------ No results found for this basename: TSH, T4TOTAL, FREET3, T3FREE, THYROIDAB,  in the last 72 hours ------------------------------------------------------------------------------------------------------------------ No results found for this basename: VITAMINB12, FOLATE, FERRITIN, TIBC, IRON, RETICCTPCT,  in the last 72 hours  Coagulation profile No results found for this basename: INR, PROTIME,  in the last 168 hours  No results found for this basename: DDIMER,  in the last 72 hours  Cardiac Enzymes No results found for this basename: CK, CKMB, TROPONINI, MYOGLOBIN,  in the last 168 hours ------------------------------------------------------------------------------------------------------------------ No components found with this basename: POCBNP,   Micro Results No results found for this or any previous visit (from the past 240 hour(s)).     Assessment & Plan    Ventilator dependent Respiratory failure; continue with weaning trials Encephalopathy multifactorial, with agitation, history of polysubstance abuse  Tachycardia on Lopressor when necessary and scheduled Low-grade fever; will monitor check chest x-ray in a.m. Dysphagia/protein calorie malnutrition on NG tube feeding Generalized weakness; PT/OT evaluation and treatment Chronic pain syndrome on pain medications per Dr Welton FlakesKhan Polysubstance abuse especially alcohol Anxiety and depression with agitation on fentanyl,  Versed and Haldol when necessary History of aspiration pneumonia/MSSA status post treatment Hypokalemia Repleted  Plan  ATC trials today Continue same Rx Code Status: Full  Family Communication: Bedside   DVT Prophylaxis  Lovenox    Carron CurieHijazi, Ali M.D on 08/22/2013 at 11:16 AM

## 2013-08-23 LAB — BASIC METABOLIC PANEL
BUN: 10 mg/dL (ref 6–23)
CO2: 27 mEq/L (ref 19–32)
Calcium: 10.3 mg/dL (ref 8.4–10.5)
Chloride: 98 mEq/L (ref 96–112)
Creatinine, Ser: 0.47 mg/dL — ABNORMAL LOW (ref 0.50–1.10)
Glucose, Bld: 108 mg/dL — ABNORMAL HIGH (ref 70–99)
POTASSIUM: 4.6 meq/L (ref 3.7–5.3)
SODIUM: 140 meq/L (ref 137–147)

## 2013-08-23 NOTE — Progress Notes (Addendum)
[facility]                                                                                              Progress note     Patient Demographics  Patricia [name], is a 53 y.o. female  UJW:[id number]  [id number]  DOB - 01/11/1961  Admit date - 08/16/2013  Admitting Physician Patricia BurgessEarl Webster, MD  Outpatient Primary MD for the patient is No PCP Per Patient  LOS - 7   Chief complaint   Respiratory failure   Alcoholism     Subjective:   Patricia [name] confused, cannot give significant history  Objective:   Vital signs  Temperature  98 Heart rate 85  Respiratory rate  20  Blood pressure  115/76 Pulse ox 99%    Exam Drowsy  [place].AT,PERRAL Supple Neck,No JVD, No cervical lymphadenopathy appreciated. Tracheostomy midline  NG tube noted Symmetrical Chest wall movement, decreased breath sounds bilaterally RRR,No Gallops,Rubs or new Murmurs, No Parasternal Heave . +ve B.Sounds, Abd Soft, Non tender, No organomegaly appriciated, No rebound - guarding or rigidity. No Cyanosis, Clubbing or edema, No new Rash or bruise     I&Os  unknown    Data Review   CBC  Recent Labs Lab 08/17/13 0600 08/22/13 0550  WBC 9.0 7.7  HGB 11.1* 10.1*  HCT 33.3* 31.4*  PLT 447* 283  MCV 92.5 96.0  MCH 30.8 30.9  MCHC 33.3 32.2  RDW 13.9 13.6  LYMPHSABS 1.9  --   MONOABS 0.8  --   EOSABS 0.1  --   BASOSABS 0.0  --     Chemistries   Recent Labs Lab 08/17/13 0600 08/22/13 0550  NA 137 141  K 3.9 2.7*  CL 96 103  CO2 24 24  GLUCOSE 98 99  BUN 13 11  CREATININE 0.51 0.37*  CALCIUM 10.1 8.1*  MG 2.1  --   AST 90*  --   ALT 61*  --   ALKPHOS 196*  --   BILITOT 0.3  --    ------------------------------------------------------------------------------------------------------------------ CrCl is unknown because there is no height on file for the current  visit. ------------------------------------------------------------------------------------------------------------------ No results found for this basename: HGBA1C,  in the last 72 hours ------------------------------------------------------------------------------------------------------------------ No results found for this basename: CHOL, HDL, LDLCALC, TRIG, CHOLHDL, LDLDIRECT,  in the last 72 hours ------------------------------------------------------------------------------------------------------------------ No results found for this basename: TSH, T4TOTAL, FREET3, T3FREE, THYROIDAB,  in the last 72 hours ------------------------------------------------------------------------------------------------------------------ No results found for this basename: VITAMINB12, FOLATE, FERRITIN, TIBC, IRON, RETICCTPCT,  in the last 72 hours  Coagulation profile No results found for this basename: INR, PROTIME,  in the last 168 hours  No results found for this basename: DDIMER,  in the last 72 hours  Cardiac Enzymes No results found for this basename: CK, CKMB, TROPONINI, MYOGLOBIN,  in the last 168 hours ------------------------------------------------------------------------------------------------------------------ No components found with this basename: POCBNP,   Micro Results No results found for this or any previous visit (from the past 240 hour(s)).     Assessment & Plan    Ventilator dependent Respiratory failure; continue with weaning trials Encephalopathy multifactorial, with agitation,  history of polysubstance abuse Tachycardia on Lopressor when necessary and scheduled Low-grade fever; will monitor check chest x-ray in a.m. Dysphagia/protein calorie malnutrition on NG tube feeding Generalized weakness; PT/OT evaluation and treatment Chronic pain syndrome on pain medications per Dr Welton FlakesKhan Polysubstance abuse especially alcohol Anxiety and depression with agitation on fentanyl,  Versed and Haldol when necessary History of aspiration pneumonia/MSSA status post treatment Hypokalemia Repleted  Plan  Continue with ATC trials PT/OT Critical care time 32 minutes Code Status: Full  Family Communication: Bedside   DVT Prophylaxis  Lovenox    Carron CurieHijazi, Patricia [name] Patricia [name] on 08/23/2013 at 11:28 AM

## 2013-08-24 NOTE — Progress Notes (Addendum)
[facility]                                                                                              Progress note     Patient Demographics  Patricia [name], is a 53 y.o. female  [id number]  [id number]  DOB - 03/29/1960  Admit date - 08/16/2013  Admitting Physician Marnette BurgessEarl Webster, MD  Outpatient Primary MD for the patient is No PCP Per Patient  LOS - 8   Chief complaint   Respiratory failure   Alcoholism     Subjective:   Patricia [name] confused, cannot give significant history  Objective:   Vital signs  Temperature  97.6 Heart rate 77  Respiratory rate  20  Blood pressure  127/68 Pulse ox 99%    Exam Drowsy  [place].AT,PERRAL Supple Neck,No JVD, No cervical lymphadenopathy appreciated. Tracheostomy midline  NG tube noted Symmetrical Chest wall movement, decreased breath sounds bilaterally RRR,No Gallops,Rubs or new Murmurs, No Parasternal Heave . +ve B.Sounds, Abd Soft, Non tender, No organomegaly appriciated, No rebound - guarding or rigidity. No Cyanosis, Clubbing or edema, No new Rash or bruise     I&Os  unknown    Data Review   CBC  Recent Labs Lab 08/22/13 0550  WBC 7.7  HGB 10.1*  HCT 31.4*  PLT 283  MCV 96.0  MCH 30.9  MCHC 32.2  RDW 13.6    Chemistries   Recent Labs Lab 08/22/13 0550 08/23/13 1309  NA 141 140  K 2.7* 4.6  CL 103 98  CO2 24 27  GLUCOSE 99 108*  BUN 11 10  CREATININE 0.37* 0.47*  CALCIUM 8.1* 10.3   ------------------------------------------------------------------------------------------------------------------ CrCl is unknown because there is no height on file for the current visit. ------------------------------------------------------------------------------------------------------------------ No results found for this basename: HGBA1C,  in the last 72  hours ------------------------------------------------------------------------------------------------------------------ No results found for this basename: CHOL, HDL, LDLCALC, TRIG, CHOLHDL, LDLDIRECT,  in the last 72 hours ------------------------------------------------------------------------------------------------------------------ No results found for this basename: TSH, T4TOTAL, FREET3, T3FREE, THYROIDAB,  in the last 72 hours ------------------------------------------------------------------------------------------------------------------ No results found for this basename: VITAMINB12, FOLATE, FERRITIN, TIBC, IRON, RETICCTPCT,  in the last 72 hours  Coagulation profile No results found for this basename: INR, PROTIME,  in the last 168 hours  No results found for this basename: DDIMER,  in the last 72 hours  Cardiac Enzymes No results found for this basename: CK, CKMB, TROPONINI, MYOGLOBIN,  in the last 168 hours ------------------------------------------------------------------------------------------------------------------ No components found with this basename: POCBNP,   Micro Results No results found for this or any previous visit (from the past 240 hour(s)).     Assessment & Plan     Respiratory failure; on ATC 28% , Failed PMV today  Encephalopathy multifactorial, with agitation, history of polysubstance abuse Tachycardia on Lopressor when necessary and scheduled Dysphagia/protein calorie malnutrition on NG tube feeding Generalized weakness; PT/OT evaluation and treatment Chronic pain syndrome on pain medications per Dr Welton FlakesKhan Polysubstance abuse especially alcohol on thiamine and folate Anxiety and depression with agitation on fentanyl, Versed and Haldol when necessary History of aspiration pneumonia/MSSA status post treatment Hypokalemia  Repleted  Plan  Continue with ATC 28% PMV trials PT/OT Critical care time 32 minutes Code Status: Full  Family  Communication: Bedside   DVT Prophylaxis  Lovenox    Carron CurieHijazi, Saquoia Sianez M.D on 08/24/2013 at 1:00 PM

## 2013-08-25 NOTE — Progress Notes (Addendum)
[facility]                                                                                              Progress note     Patient Demographics  Patricia CelestineSusy Trudie ReedLassen, is a 53 y.o. female  XBJ:[id number]  [id number]  DOB - 04/17/1960  Admit date - 08/16/2013  Admitting Physician Marnette BurgessEarl Webster, MD  Outpatient Primary MD for the patient is No PCP Per Patient  LOS - 9   Chief complaint   Respiratory failure   Alcoholism     Subjective:   Kensy Blundell no new complaints except of diarrhea  Objective:   Vital signs  Temperature  98.9 Heart rate 89  Respiratory rate 18  Blood pressure  111/76 Pulse ox 97%    Exam Drowsy  [place].AT,PERRAL Supple Neck,No JVD, No cervical lymphadenopathy appreciated. Tracheostomy midline  NG tube noted Symmetrical Chest wall movement, decreased breath sounds bilaterally RRR,No Gallops,Rubs or new Murmurs, No Parasternal Heave . +ve B.Sounds, Abd Soft, Non tender, No organomegaly appriciated, No rebound - guarding or rigidity. No Cyanosis, Clubbing or edema, No new Rash or bruise     I&Os  unknown    Data Review   CBC  Recent Labs Lab 08/22/13 0550  WBC 7.7  HGB 10.1*  HCT 31.4*  PLT 283  MCV 96.0  MCH 30.9  MCHC 32.2  RDW 13.6    Chemistries   Recent Labs Lab 08/22/13 0550 08/23/13 1309  NA 141 140  K 2.7* 4.6  CL 103 98  CO2 24 27  GLUCOSE 99 108*  BUN 11 10  CREATININE 0.37* 0.47*  CALCIUM 8.1* 10.3   ------------------------------------------------------------------------------------------------------------------ CrCl is unknown because there is no height on file for the current visit. ------------------------------------------------------------------------------------------------------------------ No results found for this basename: HGBA1C,  in the last 72  hours ------------------------------------------------------------------------------------------------------------------ No results found for this basename: CHOL, HDL, LDLCALC, TRIG, CHOLHDL, LDLDIRECT,  in the last 72 hours ------------------------------------------------------------------------------------------------------------------ No results found for this basename: TSH, T4TOTAL, FREET3, T3FREE, THYROIDAB,  in the last 72 hours ------------------------------------------------------------------------------------------------------------------ No results found for this basename: VITAMINB12, FOLATE, FERRITIN, TIBC, IRON, RETICCTPCT,  in the last 72 hours  Coagulation profile No results found for this basename: INR, PROTIME,  in the last 168 hours  No results found for this basename: DDIMER,  in the last 72 hours  Cardiac Enzymes No results found for this basename: CK, CKMB, TROPONINI, MYOGLOBIN,  in the last 168 hours ------------------------------------------------------------------------------------------------------------------ No components found with this basename: POCBNP,   Micro Results No results found for this or any previous visit (from the past 240 hour(s)).     Assessment & Plan     Respiratory failure; on ATC 28% , Failed PMV yesterday  Encephalopathy multifactorial, with agitation, history of polysubstance abuse Tachycardia on Lopressor when necessary and scheduled Dysphagia/protein calorie malnutrition on NG tube feeding Generalized weakness; PT/OT evaluation and treatment Chronic pain syndrome on pain medications per Dr Welton FlakesKhan Polysubstance abuse especially alcohol on thiamine and folate Anxiety and depression with agitation on fentanyl, Versed and Haldol when necessary History of aspiration pneumonia/MSSA status post treatment Hypokalemia  Repleted Diarrhea, no fever, non-bloody, probably due to tube feeds  Plan [place] Continue with ATC 28% PMV  trials PT/OT  Code Status: Full  Family Communication: Bedside   DVT Prophylaxis  Lovenox    Carron CurieHijazi, Ali M.D on 08/25/2013 at 12:10 PM

## 2013-08-26 LAB — BASIC METABOLIC PANEL
BUN: 16 mg/dL (ref 6–23)
CHLORIDE: 96 meq/L (ref 96–112)
CO2: 31 mEq/L (ref 19–32)
CREATININE: 0.59 mg/dL (ref 0.50–1.10)
Calcium: 10.2 mg/dL (ref 8.4–10.5)
GFR calc non Af Amer: >90 mL/min (ref >90)
Glucose, Bld: 101 mg/dL — ABNORMAL HIGH (ref 70–99)
POTASSIUM: 4.5 meq/L (ref 3.7–5.3)
Sodium: 139 mEq/L (ref 137–147)

## 2013-08-28 LAB — BASIC METABOLIC PANEL
BUN: 7 mg/dL (ref 6–23)
CO2: 31 mEq/L (ref 19–32)
Calcium: 10 mg/dL (ref 8.4–10.5)
Chloride: 94 mEq/L — ABNORMAL LOW (ref 96–112)
Creatinine, Ser: 0.68 mg/dL (ref 0.50–1.10)
Glucose, Bld: 81 mg/dL (ref 70–99)
POTASSIUM: 3.9 meq/L (ref 3.7–5.3)
SODIUM: 137 meq/L (ref 137–147)

## 2013-08-28 LAB — CBC
HEMATOCRIT: 36.6 % (ref 36.0–46.0)
Hemoglobin: 11.5 g/dL — ABNORMAL LOW (ref 12.0–15.0)
MCH: 30.3 pg (ref 26.0–34.0)
MCHC: 31.4 g/dL (ref 30.0–36.0)
MCV: 96.3 fL (ref 78.0–100.0)
Platelets: 302 10*3/uL (ref 150–400)
RBC: 3.8 MIL/uL — ABNORMAL LOW (ref 3.87–5.11)
RDW: 13.3 % (ref 11.5–15.5)
WBC: 4.4 10*3/uL (ref 4.0–10.5)

## 2013-08-29 LAB — BASIC METABOLIC PANEL
ANION GAP: 13 (ref 5–15)
BUN: 6 mg/dL (ref 6–23)
CHLORIDE: 99 meq/L (ref 96–112)
CO2: 30 meq/L (ref 19–32)
Calcium: 9.7 mg/dL (ref 8.4–10.5)
Creatinine, Ser: 0.73 mg/dL (ref 0.50–1.10)
GFR calc non Af Amer: >90 mL/min (ref >90)
Glucose, Bld: 77 mg/dL (ref 70–99)
POTASSIUM: 4.2 meq/L (ref 3.7–5.3)
SODIUM: 142 meq/L (ref 137–147)

## 2013-08-29 LAB — MAGNESIUM: MAGNESIUM: 2.3 mg/dL (ref 1.5–2.5)

## 2013-09-01 LAB — BASIC METABOLIC PANEL
ANION GAP: 12 (ref 5–15)
BUN: 4 mg/dL — ABNORMAL LOW (ref 6–23)
CHLORIDE: 100 meq/L (ref 96–112)
CO2: 29 mEq/L (ref 19–32)
Calcium: 9.7 mg/dL (ref 8.4–10.5)
Creatinine, Ser: 0.76 mg/dL (ref 0.50–1.10)
Glucose, Bld: 81 mg/dL (ref 70–99)
POTASSIUM: 4.4 meq/L (ref 3.7–5.3)
SODIUM: 141 meq/L (ref 137–147)

## 2013-09-01 LAB — MAGNESIUM: Magnesium: 2.2 mg/dL (ref 1.5–2.5)

## 2013-09-02 LAB — CBC
HCT: 42.2 % (ref 36.0–46.0)
Hemoglobin: 13.5 g/dL (ref 12.0–15.0)
MCH: 30.8 pg (ref 26.0–34.0)
MCHC: 32 g/dL (ref 30.0–36.0)
MCV: 96.1 fL (ref 78.0–100.0)
PLATELETS: 266 10*3/uL (ref 150–400)
RBC: 4.39 MIL/uL (ref 3.87–5.11)
RDW: 13.5 % (ref 11.5–15.5)
WBC: 3.5 10*3/uL — AB (ref 4.0–10.5)

## 2013-09-02 LAB — BASIC METABOLIC PANEL
Anion gap: 14 (ref 5–15)
BUN: 3 mg/dL — ABNORMAL LOW (ref 6–23)
CALCIUM: 9.9 mg/dL (ref 8.4–10.5)
CHLORIDE: 98 meq/L (ref 96–112)
CO2: 27 mEq/L (ref 19–32)
CREATININE: 0.72 mg/dL (ref 0.50–1.10)
GFR calc non Af Amer: >90 mL/min (ref >90)
Glucose, Bld: 111 mg/dL — ABNORMAL HIGH (ref 70–99)
Potassium: 4.4 mEq/L (ref 3.7–5.3)
Sodium: 139 mEq/L (ref 137–147)

## 2013-09-02 NOTE — Progress Notes (Signed)
[facility]                                                                                              Progress note     Patient Demographics  Patricia [name], is a 53 y.o. female  [id number]  [id number]  DOB - 05/14/1960  Admit date - 08/16/2013  Admitting Physician Marnette BurgessEarl Webster, MD  Outpatient Primary MD for the patient is No PCP Per Patient  LOS - 17   Chief complaint   Respiratory failure   Alcoholism     Subjective:   Patricia [name] no new complaints /  Objective:   Vital signs  Temperature  97.7 Heart rate 109  Respiratory rate 18  Blood pressure  102/68 Pulse ox 91%    Exam Drowsy  [place].AT,PERRAL Supple Neck,No JVD, No cervical lymphadenopathy appreciated.   NG tube noted Symmetrical Chest wall movement, decreased breath sounds bilaterally RRR,No Gallops,Rubs or new Murmurs, No Parasternal Heave . +ve B.Sounds, Abd Soft, Non tender, No organomegaly appriciated, No rebound - guarding or rigidity. No Cyanosis, Clubbing or edema, No new Rash or bruise     I&Os  unknown    Data Review   CBC  Recent Labs Lab 08/28/13 0530 09/02/13 1350  WBC 4.4 3.5*  HGB 11.5* 13.5  HCT 36.6 42.2  PLT 302 266  MCV 96.3 96.1  MCH 30.3 30.8  MCHC 31.4 32.0  RDW 13.3 13.5    Chemistries   Recent Labs Lab 08/28/13 0530 08/29/13 0515 09/01/13 0540 09/02/13 1350  NA 137 142 141 139  K 3.9 4.2 4.4 4.4  CL 94* 99 100 98  CO2 31 30 29 27   GLUCOSE 81 77 81 111*  BUN 7 6 4* 3*  CREATININE 0.68 0.73 0.76 0.72  CALCIUM 10.0 9.7 9.7 9.9  MG  --  2.3 2.2  --    ------------------------------------------------------------------------------------------------------------------ CrCl is unknown because there is no height on file for the current visit. ------------------------------------------------------------------------------------------------------------------ No results  found for this basename: HGBA1C,  in the last 72 hours ------------------------------------------------------------------------------------------------------------------ No results found for this basename: CHOL, HDL, LDLCALC, TRIG, CHOLHDL, LDLDIRECT,  in the last 72 hours ------------------------------------------------------------------------------------------------------------------ No results found for this basename: TSH, T4TOTAL, FREET3, T3FREE, THYROIDAB,  in the last 72 hours ------------------------------------------------------------------------------------------------------------------ No results found for this basename: VITAMINB12, FOLATE, FERRITIN, TIBC, IRON, RETICCTPCT,  in the last 72 hours  Coagulation profile No results found for this basename: INR, PROTIME,  in the last 168 hours  No results found for this basename: DDIMER,  in the last 72 hours  Cardiac Enzymes No results found for this basename: CK, CKMB, TROPONINI, MYOGLOBIN,  in the last 168 hours ------------------------------------------------------------------------------------------------------------------ No components found with this basename: POCBNP,   Micro Results No results found for this or any previous visit (from the past 240 hour(s)).     Assessment & Plan     Respiratory failure; resolved status post decannulation on room air Encephalopathy multifactorial, resolved Tachycardia on Lopressor when necessary and scheduled Dysphagia/protein calorie malnutrition tolerating by mouth Generalized weakness; PT/OT evaluation and treatment Chronic pain syndrome on pain medications per  Dr Welton FlakesKhan Polysubstance abuse especially alcohol on thiamine and folate Anxiety and depression with agitation  History of aspiration pneumonia/MSSA status post treatment Hypokalemia Repleted Diarrhea, resolved  Plan Labs today Discharge in a.m. if stable Code Status: Full  Family Communication: Bedside   DVT  Prophylaxis  Lovenox    Carron CurieHijazi, Patricia Govan M.D on 09/02/2013 at 4:49 PM

## 2013-09-19 DIAGNOSIS — F32A Depression, unspecified: Secondary | ICD-10-CM | POA: Insufficient documentation

## 2013-09-19 DIAGNOSIS — Z8659 Personal history of other mental and behavioral disorders: Secondary | ICD-10-CM | POA: Insufficient documentation

## 2013-11-26 ENCOUNTER — Ambulatory Visit: Payer: Self-pay | Admitting: Family Medicine

## 2013-12-02 ENCOUNTER — Ambulatory Visit: Payer: Self-pay | Admitting: Family Medicine

## 2014-01-14 DIAGNOSIS — J9503 Malfunction of tracheostomy stoma: Secondary | ICD-10-CM | POA: Insufficient documentation

## 2014-06-21 NOTE — Discharge Summary (Signed)
Dates of Admission and Diagnosis:  Date of Admission 30-Jul-2013   Date of Discharge 16-Aug-2013   Admitting Diagnosis Drug withdrawal, AMS   Final Diagnosis Ac respi failure- Staph aureus pneumonia- treated as per sensitivity by levaquin. finished course.    failed extubation trials - received tracheostomy 6/15- unable to take off vent. Altered mental status- Drug abuse- difficult tapering sedation due t multi dug and alcohol abuse history. electrolyte imbalance- replacing as needed. Hx of depression and anxiety- meds to be djusted after extubation- may need Psych help for drug abuse.    Chief Complaint/History of Present Illness a 54 year old female with history of depression, who comes today after calling the police, saying that she needed to be involuntarily committed due to alcohol abuse. All they knew was that she had alcohol intoxication, but on arrival the patient was significantly unresponsive when coming out of the ambulance. Other than that, we do not have any further information. The patient is pretty much unresponsive. She had significant low blood pressures, ranging in the low 80s to 90s over 50s, tachycardia in the mid 90s. The patient has significant snoring. She is breathing occasionally up to 24, sometimes around 10. I counted 10 physically, and the patient has respiratory acidosis with hypoxia, for which I intubated the patient and sent her to the Critical Care Unit.   Allergies:  No Known Allergies:   Thyroid:  01-Jun-15 23:18   Thyroid Stimulating Hormone 2.70 (0.45-4.50 (International Unit)  ----------------------- Pregnant patients have  different reference  ranges for TSH:  - - - - - - - - - -  Pregnant, first trimetser:  0.36 - 2.50 uIU/mL)  Hepatic:  01-Jun-15 23:18   Bilirubin, Total 0.2  Alkaline Phosphatase 84 (45-117 NOTE: New Reference Range 01/18/13)  SGPT (ALT) 18  SGOT (AST) 35  Total Protein, Serum 7.3  Albumin, Serum 3.8  Routine Micro:   05-Jun-15 18:00   Micro Text Report URINE CULTURE   COMMENT                   NO GROWTH IN 36 HOURS   ANTIBIOTIC                       Culture Comment NO GROWTH IN 36 HOURS  Result(s) reported on 04 Aug 2013 at 10:39AM.  Specimen Source INDWELLING CATH    19:28   Micro Text Report BLOOD CULTURE   COMMENT                   NO GROWTH AEROBICALLY/ANAEROBICALLY IN 5 DAYS   ANTIBIOTIC                       Micro Text Report BLOOD CULTURE   COMMENT                   NO GROWTH AEROBICALLY/ANAEROBICALLY IN 5 DAYS   ANTIBIOTIC                       Culture Comment NO GROWTH AEROBICALLY/ANAEROBICALLY IN 5 DAYS  Result(s) reported on 07 Aug 2013 at 07:00PM.  Culture Comment NO GROWTH AEROBICALLY/ANAEROBICALLY IN 5 DAYS  Result(s) reported on 07 Aug 2013 at 07:00PM.  06-Jun-15 09:47   Organism Name STAPHYLOCOCCUS AUREUS  Organism Quantity HEAVY GROWTH  Clindamycin Sensitivity S  Oxacillin Sensitivity S  Ciprofloxacin Sensitivity S  Gentamicin Sensitivity S  Erythromycin Sensitivity S  Levofloxacin Sensitivity S  Linezolid Sensitivity S  Tigecycline Sensitivity S  Trimethoprim/Sulfamethoxazole Sensitivty S  Cefoxitin Scrn. NEGATIVE  Micro Text Report SPUTUM CULTURE   ORGANISM 1                HEAVY GROWTH STAPHYLOCOCCUS AUREUS   GRAM STAIN                EXCELLENT SPECIMEN-90-100% WBC   GRAM STAIN                MANY WHITE BLOOD CELLS   GRAM STAIN                MANY GRAM POSITIVE COCCI IN PAIRS AND CLUSTERS   ANTIBIOTIC                    ORG#1     CIPROFLOXACIN                 S         CLINDAMYCIN                   S         ERYTHROMYCIN                  S         GENTAMICIN                    S         LEVOFLOXACIN    S         LINEZOLID                     S         OXACILLIN                     S         TIGECYCLINE                   S         CEFOXITIN SCREEN              NEGATIVE  INDUCIBLE CLINDAMYCIN RESISTANNEGATIVE  TRIMETHOPRIM/SULFAMETHOXAZOLE S   Organism 1  HEAVY GROWTH STAPHYLOCOCCUS AUREUS  Culture Comment SENSITIVITIES TO FOLLOW  Gram Stain 1 EXCELLENT SPECIMEN-90-100% WBC  Gram Stain 2 MANY WHITE BLOOD CELLS  Gram Stain 3 MANY GRAM POSITIVE COCCI IN PAIRS AND CLUSTERS  Result(s) reported on 06 Aug 2013 at 11:27AM.  General Ref:  01-Jun-15 23:18   Acetaminophen, Serum < 2 (10-30 POTENTIALLY TOXIC:  > 200 mcg/mL  > 50 mcg/mL at 12 hr after  ingestion  > 300 mcg/mL at 4 hr after  ingestion)  Salicylates, Serum < 1.7 (0.0-2.8 Therapeutic 2.8-20.0 mg/dL Toxic >30.0 mg/dL)  Routine Chem:  01-Jun-15 23:18   Glucose, Serum 87  BUN  6  Creatinine (comp) 0.67  Sodium, Serum 138  Potassium, Serum 3.7  Chloride, Serum 103  CO2, Serum 30  Calcium (Total), Serum  8.3  Anion Gap  5  Osmolality (calc) 273  eGFR (African American) >60  eGFR (Non-African American) >60 (eGFR values <26m/min/1.73 m2 may be an indication of chronic kidney disease (CKD). Calculated eGFR is useful in patients with stable renal function. The eGFR calculation will not be reliable in acutely ill patients when serum creatinine is changing rapidly. It is not useful in  patients on dialysis. The eGFR calculation may not be applicable to patients at the low  and high extremes of body sizes, pregnant women, and vegetarians.)  Ethanol, S. 275  Ethanol % (comp)  0.275 (Result(s) reported on 29 Jul 2013 at 11:58PM.)  03-Jun-15 03:40   Creatinine (comp) 0.67  Potassium, Serum 3.7  Magnesium, Serum  1.7 (1.8-2.4 THERAPEUTIC RANGE: 4-7 mg/dL TOXIC: > 10 mg/dL  -----------------------)  04-Jun-15 04:15   Magnesium, Serum  1.7 (1.8-2.4 THERAPEUTIC RANGE: 4-7 mg/dL TOXIC: > 10 mg/dL  -----------------------)  05-Jun-15 04:20   Potassium, Serum  2.9 (Result(s) reported on 02 Aug 2013 at 05:22AM.)  Magnesium, Serum  1.6 (1.8-2.4 THERAPEUTIC RANGE: 4-7 mg/dL TOXIC: > 10 mg/dL  -----------------------)  06-Jun-15 08:45   Creatinine (comp) 0.65  Potassium, Serum 3.6   Magnesium, Serum 2.0 (1.8-2.4 THERAPEUTIC RANGE: 4-7 mg/dL TOXIC: > 10 mg/dL  -----------------------)  07-Jun-15 03:39   Creatinine (comp) 0.66  Potassium, Serum 3.8  08-Jun-15 04:09   Creatinine (comp)  0.57  Potassium, Serum 4.2  11-Jun-15 03:50   Creatinine (comp)  0.57  Potassium, Serum  3.3  12-Jun-15 04:05   Creatinine (comp) 0.70  Potassium, Serum  3.4  13-Jun-15 05:50   Creatinine (comp) 0.64  Potassium, Serum 3.5  Magnesium, Serum 2.1 (1.8-2.4 THERAPEUTIC RANGE: 4-7 mg/dL TOXIC: > 10 mg/dL  -----------------------)  14-Jun-15 03:52   Creatinine (comp) 0.81  Potassium, Serum  3.4  Magnesium, Serum 2.1 (1.8-2.4 THERAPEUTIC RANGE: 4-7 mg/dL TOXIC: > 10 mg/dL  -----------------------)  15-Jun-15 03:57   Creatinine (comp) 0.68  Potassium, Serum 3.6  Magnesium, Serum 2.0 (1.8-2.4 THERAPEUTIC RANGE: 4-7 mg/dL TOXIC: > 10 mg/dL  -----------------------)  16-Jun-15 03:49   Creatinine (comp) 0.69  Potassium, Serum  3.4  Magnesium, Serum 1.8 (1.8-2.4 THERAPEUTIC RANGE: 4-7 mg/dL TOXIC: > 10 mg/dL  -----------------------)  17-Jun-15 03:46   Creatinine (comp)  0.41  Potassium, Serum  3.3  Magnesium, Serum 1.9 (1.8-2.4 THERAPEUTIC RANGE: 4-7 mg/dL TOXIC: > 10 mg/dL  -----------------------)  18-Jun-15 03:44   Creatinine (comp)  0.51  Potassium, Serum  3.3  Magnesium, Serum 1.9 (1.8-2.4 THERAPEUTIC RANGE: 4-7 mg/dL TOXIC: > 10 mg/dL  -----------------------)    18:30   Potassium, Serum 3.8 (Result(s) reported on 15 Aug 2013 at 06:57PM.)  19-Jun-15 04:10   Result Comment LABS - This specimen was collected through an   - indwelling catheter or arterial line.  - A minimum of 65ms of blood was wasted prior    - to collecting the sample.  Interpret  - results with caution.  Result(s) reported on 16 Aug 2013 at 05:06AM.  Triglycerides, Serum 197  Glucose, Serum 87  BUN 12  Creatinine (comp) 0.60  Sodium, Serum  135  Potassium, Serum 3.5  Chloride,  Serum 104  CO2, Serum 23  Calcium (Total), Serum 9.2  Anion Gap 8  Osmolality (calc) 269  eGFR (African American) >60  eGFR (Non-African American) >60 (eGFR values <621mmin/1.73 m2 may be an indication of chronic kidney disease (CKD). Calculated eGFR is useful in patients with stable renal function. The eGFR calculation will not be reliable in acutely ill patients when serum creatinine is changing rapidly. It is not useful in  patients on dialysis. The eGFR calculation may not be applicable to patients at the low and high extremes of body sizes, pregnant women, and vegetarians.)  Magnesium, Serum 1.8 (1.8-2.4 THERAPEUTIC RANGE: 4-7 mg/dL TOXIC: > 10 mg/dL  -----------------------)  Phosphorus, Serum 3.7 (Result(s) reported on 16 Aug 2013 at 05:06AM.)  Urine Drugs:  [id number] Tricyclic Antidepressant, Ur Qual (comp) NEGATIVE (  Result(s) reported on 29 Jul 2013 at 11:54PM.)  Amphetamines, Urine Qual. NEGATIVE  MDMA, Urine Qual. POSITIVE  Cocaine Metabolite, Urine Qual. NEGATIVE  Opiate, Urine qual NEGATIVE  Phencyclidine, Urine Qual. NEGATIVE  Cannabinoid, Urine Qual. NEGATIVE  Barbiturates, Urine Qual. NEGATIVE  Benzodiazepine, Urine Qual. POSITIVE (----------------- The URINE DRUG SCREEN provides only a preliminary, unconfirmed analytical test result and should not be used for non-medical  purposes.  Clinical consideration and professional judgment should be  applied to any positive drug screen result due to possible interfering substances.  A more specific alternate chemical method must be used in order to obtain a confirmed analytical result.  Gas chromatography/mass spectrometry (GC/MS) is the preferred confirmatory method.)  Methadone, Urine Qual. NEGATIVE  Cardiac:  01-Jun-15 23:18   Troponin I < 0.02 (0.00-0.05 0.05 ng/mL or less: NEGATIVE  Repeat testing in 3-6 hrs  if clinically indicated. >0.05 ng/mL: POTENTIAL  MYOCARDIAL INJURY. Repeat  testing in 3-6  hrs if  clinically indicated. NOTE: An increase or decrease  of 30% or more on serial  testing suggests a  clinically important change)  Routine UA:  01-Jun-15 23:18   Color (UA) Colorless  Clarity (UA) Clear  Glucose (UA) Negative  Bilirubin (UA) Negative  Ketones (UA) Negative  Specific Gravity (UA) 1.001  Blood (UA) Negative  pH (UA) 6.0  Protein (UA) Negative  Nitrite (UA) Negative  Leukocyte Esterase (UA) Negative (Result(s) reported on 29 Jul 2013 at 11:55PM.)  RBC (UA) <1 /HPF  WBC (UA) NONE SEEN  Bacteria (UA) NONE SEEN  Epithelial Cells (UA) NONE SEEN  Result(s) reported on 29 Jul 2013 at 11:55PM.  Routine Hem:  01-Jun-15 23:18   WBC (CBC) 4.5  RBC (CBC) 4.10  Hemoglobin (CBC) 13.1  Hematocrit (CBC) 39.0  Platelet Count (CBC) 200 (Result(s) reported on 29 Jul 2013 at 11:46PM.)  MCV 95  MCH 31.9  MCHC 33.5  RDW  15.0  03-Jun-15 03:40   WBC (CBC) 7.6  Hemoglobin (CBC) 12.9  Platelet Count (CBC) 176  06-Jun-15 08:45   WBC (CBC) 6.6  Hemoglobin (CBC)  11.1  Platelet Count (CBC) 161  07-Jun-15 03:39   WBC (CBC) 6.5  Hemoglobin (CBC)  10.4  Platelet Count (CBC) 166  08-Jun-15 04:09   WBC (CBC) 5.8  Hemoglobin (CBC)  10.1  Platelet Count (CBC) 203  11-Jun-15 03:50   WBC (CBC) 6.2  Hemoglobin (CBC)  9.9  Platelet Count (CBC) 273  12-Jun-15 04:05   WBC (CBC) 6.4  Hemoglobin (CBC)  10.3  Platelet Count (CBC) 283  14-Jun-15 03:52   WBC (CBC) 9.8  Hemoglobin (CBC)  11.6  Platelet Count (CBC) 333  15-Jun-15 03:57   WBC (CBC) 8.9  Hemoglobin (CBC)  11.0  Platelet Count (CBC) 322  16-Jun-15 03:49   WBC (CBC) 7.1  Hemoglobin (CBC)  10.6  Platelet Count (CBC) 317  17-Jun-15 03:46   WBC (CBC) 6.0  Hemoglobin (CBC)  10.5  Platelet Count (CBC) 330  19-Jun-15 08:53   WBC (CBC) 10.4  RBC (CBC) 3.84  Hemoglobin (CBC) 12.1  Hematocrit (CBC) 35.9  Platelet Count (CBC)  448  MCV 94  MCH 31.5  MCHC 33.6  RDW  14.7  Neutrophil % 76.9  Lymphocyte %  14.0  Monocyte % 7.0  Eosinophil % 0.6  Basophil % 1.5  Neutrophil #  8.0  Lymphocyte # 1.5  Monocyte # 0.7  Eosinophil # 0.1  Basophil #  0.2 (Result(s) reported on 16 Aug 2013 at 09:47AM.)  PERTINENT RADIOLOGY STUDIES: XRay:    02-Jun-15 00:00, Chest Portable Single View  Chest Portable Single View   REASON FOR EXAM:    altered mental status  COMMENTS:       PROCEDURE: DXR - DXR PORTABLE CHEST SINGLE VIEW  - Jul 30 2013 12:00AM     CLINICAL DATA:  Unresponsive patient.    EXAM:  PORTABLE CHEST - 1 VIEW    COMPARISON:  05/23/2013    FINDINGS:  Heart, mediastinum and hila are unremarkable. Stable right upper  lobe granuloma. Lungs are otherwise clear. No pleural effusion. No  pneumothorax. Bony thorax is intact.     IMPRESSION:  No active disease.      Electronically Signed    By: Sibyl Parr.D.    On: 07/30/2013 00:30         Verified By: Lasandra Beech, M.D.,    02-Jun-15 04:56, Chest Portable Single View  Chest Portable Single View   REASON FOR EXAM:    Post intubation  COMMENTS:       PROCEDURE: DXR - DXR PORTABLE CHEST SINGLE VIEW  - Jul 30 2013  4:56AM     CLINICAL DATA:  Intubation.    EXAM:  PORTABLE CHEST - 1 VIEW    COMPARISON:  07/29/2013    FINDINGS:  Endotracheal tube ends at the carina. The orogastric tube enters the  stomach. These results were called by telephone at the time of  interpretation on 07/30/2013 at 5:34 AM to [place], who verbally  acknowledged these results.    The lungs remain relatively well aerated. No cardiomegaly when  accounting for technique. No effusion or pneumothorax.     IMPRESSION:  1. Endotracheal tube at the carina.  Suggest retraction by 2 cm.  2. Orogastric tube in good position.  3. Lungs remain essentially clear.      Electronically Signed    By: Jorje Guild M.D.    On: 07/30/2013 05:35     Verified By: Gilford Silvius, M.D.,    05-Jun-15 18:29, Chest Portable Single View  Chest  Portable Single View   REASON FOR EXAM:    ?Pnuemonia  COMMENTS:       PROCEDURE: DXR - DXR PORTABLE CHEST SINGLE VIEW  - Aug 02 2013  6:29PM     CLINICAL DATA:  Pneumonia.    EXAM:  PORTABLE CHEST - 1 VIEW    COMPARISON:  Chest x-ray 08/01/2013.    FINDINGS:  An endotracheal tube is in place with tip 1.8 cm above the carina. A  nasogastric tube is seen extending into the stomach, however, the  tip of the nasogastric tube extends below the lower margin of the  image. Lung volumes are low. Developing ill-defined opacityin the  medial aspect of the right lung base may reflect sequela of recent  aspiration or developing airspace consolidation from pneumonia.  Lungs are otherwise clear. No pleural effusions. No evidence of  pulmonary edema. Heart size is normal. The patient is rotated to the  left on today's exam, resulting in distortion of the mediastinal  contours and reduced diagnostic sensitivity and specificity for  mediastinal pathology.     IMPRESSION:  1. Support apparatus, as above. Endotracheal tube is slightly low in  position in could be withdrawn approximately 2 cm for more optimal  placement.  2. Developing opacity in the right lower lobe concerning for sequela  of recent aspiration or developing pneumonia.      Electronically  Signed    By: Vinnie Langton M.D.    On: 08/02/2013 18:42         Verified By: Etheleen Mayhew, M.D.,    06-Jun-15 08:55, Chest Portable Single View  Chest Portable Single View   REASON FOR EXAM:    vent, eval for pna  COMMENTS:       PROCEDURE: DXR - DXR PORTABLE CHEST SINGLE VIEW  - Aug 03 2013  8:55AM     CLINICAL DATA:  Evaluate for pneumonia.    EXAM:  PORTABLE CHEST - 1 VIEW    COMPARISON:  08/02/2013    FINDINGS:  Endotracheal tube with tip 2 cm above the carina. Nasogastric tube  courses through the region of the stomach and off the inferior  portion of the film. Lungs are hypoinflated with continued  hazy  opacification over the right base which may represent combination of  pleural fluid with atelectasis although cannot exclude infection.  Cardiomediastinal silhouette and remainder of the exam is unchanged.     IMPRESSION:  Stable hazy opacification over the right base which may be due to  layering pleural fluid with atelectasis, although cannot exclude  infection.    Tubes and lines unchanged.      Electronically Signed    By: Marin Olp M.D.    On: 08/03/2013 09:24         Verified By: Pearletha Alfred, M.D.,    08-Jun-15 13:47, Chest Portable Single View  Chest Portable Single View   REASON FOR EXAM:    intubated  COMMENTS:       PROCEDURE: DXR - DXR PORTABLE CHEST SINGLE VIEW  - Aug 05 2013  1:47PM     CLINICAL DATA:  Check endotracheal tube    EXAM:  PORTABLE CHEST - 1 VIEW    COMPARISON:  08/05/2013    FINDINGS:  The cardiac shadow is stable. A left-sided PICC line is again noted  now at the superior aspect of the right atrium withdrawn from the  prior exam. A nasogastric catheter is noted coiled within the  stomach. An endotracheal tube is seen with the tip approximately 2  cm above the carina. The previously seen changes suggesting  pneumomediastinum are not well appreciated on this exam. Increased  density in the left lung base is seen likely related to atelectasis.  There is also likely a small left-sided pleural effusion present as  well.     IMPRESSION:  New left basilar atelectasis with associated effusion.    Tubes and lines as described.      Electronically Signed    By: Inez Catalina M.D.    On: 08/05/2013 14:00         Verified By: Everlene Farrier, M.D.,    14-Jun-15 08:47, Chest Portable Single View  Chest Portable Single View   REASON FOR EXAM:    pneumonia- compare  COMMENTS:       PROCEDURE: DXR - DXR PORTABLE CHEST SINGLE VIEW  - Aug 11 2013  8:47AM     CLINICAL DATA:  Pneumonia.    EXAM:  PORTABLE CHEST - 1  VIEW    COMPARISON:  Chest x-ray 08/08/2013.    FINDINGS:  Endotracheal tube, NG tube, and left PICC line stable position.  Right mid lung field subsegmental atelectasis. Mild right base  infiltrate, stable. No pleural effusion or pneumothorax. Heart size  and pulmonary vascularity normal. Calcified pulmonary nodules  consistent with granulomas.  IMPRESSION:  1. Line and tube positions stable .    2. Right lower lobe subsegmental atelectasis and mild pulmonary  infiltrate, unchanged.      Electronically Signed    By: Marcello Moores  Register    On: 08/11/2013 09:24     Verified By: Osa Craver, M.D., MD  LabUnknown:    02-Jun-15 00:00, Chest Portable Single View  PACS Image     02-Jun-15 02:21, CT Head Without Contrast  PACS Image     02-Jun-15 04:56, Chest Portable Single View  PACS Image     05-Jun-15 18:29, Chest Portable Single View  PACS Image     06-Jun-15 08:55, Chest Portable Single View  PACS Image     08-Jun-15 13:47, Chest Portable Single View  PACS Image     14-Jun-15 08:47, Chest Portable Single View  PACS Image   CT:    02-Jun-15 02:21, CT Head Without Contrast  CT Head Without Contrast   REASON FOR EXAM:    obtunded, ethanol intoxication, hypotension  COMMENTS:       PROCEDURE: CT  - CT HEAD WITHOUT CONTRAST  - Jul 30 2013  2:21AM     CLINICAL DATA:  Obtunded.  Ethanol intoxication.  Hypertension.    EXAM:  CT HEAD WITHOUT CONTRAST    TECHNIQUE:  Contiguous axial images were obtained from the base of the skull  through the vertex without intravenous contrast.    COMPARISON:  05/23/2013  FINDINGS:  Skull and Sinuses:Right parotid and masticator space subcutaneous  gas is most likely intravenous. No fracture or destructive process.    Orbits: No acute abnormality.    Brain: No evidence of acute abnormality, such as acute infarction,  hemorrhage, hydrocephalus, or mass lesion/mass effect. Punctate  calcification at the medial left putamen  is stable from previous.     IMPRESSION:  1. No acute intracranial disease.  2. Right parotid and masticator space gas is likely from venous  access, but please confirm no right facial trauma.  Electronically Signed    By: Jorje Guild M.D.    On: 07/30/2013 02:32         Verified By: Gilford Silvius, M.D.,   Pertinent Past History:  Pertinent Past History 1.  Depression.  2.  Anxiety.   This is obtained from previous records.   Hospital Course:  Hospital Course * Acute encephalopathy  - likley going through DT's and/or withdrawal from other substances, failed extubation twice. given tracheostomy. currently on sedation tapering for SBT. addeed precedex and xanax. still had significant agitattion daily on tapering sedations. may require to be on vent via trach- for long- transfer today to LTAC.  * Acute respiratory failure- - self extubated evening 6/4, reintubated - extubated 6/09 but failed (tachypneic, labored breathing, tachycardic) and had to reintubate same night - had difficult reintubation- husband signed consent- tracheostomy done 08/12/13- trial of SBT daily, failing. transfer to LTAC.  *fever: on 6/5. possible its due to aspiration while pt was unresponsive - was on zosyn/levaquin/vanco - tapered on 08/06/13- to levaquin( asper sputum cx- staph aureus)- bl cx negative.   again had fever 6/13- 100.4- negative UA. WBCs stable. no more fever.  on 6/19 again temp 100.8 likley due to agitations. WBC count stable, no secretions from Trach- collected blood cx.  *Staph Aureus Pneumonia: was on vanco + Levaquin + zosyn for aspiration Pneumonia. tapered to levaquin as per sensitivity.   started levaquin- 08/04/13- given total 7 days. remains afebrile and WBC  stabl- off Abx.  * Alcohol abuse and intoxication- monitor sedated on vent. now tapering propofol. on precedex.  Have significan agitation on trial of tapering sedation.  * Depression and anxiety- will need to restart  psych meds once extubated -psych consult once extubated  adding xanax for agitaion as tapering sedation.  *hypok/hypomag:  prn replacement  * GI and DVT prophylaxis husband  [phone])- .  remains critically ill and high risk for cardio-pulmo arrest , unable to come off the ventilator due to agitation.  trach done 08/12/13. now can try spontaneous breath.  d/c to LTAC today.   Condition on Discharge Stable   Code Status:  Code Status Full Code   DISCHARGE INSTRUCTIONS HOME MEDS:  Medication Reconciliation: Patient's Home Medications at Discharge:     Medication Instructions  aripiprazole 2 mg oral tablet  1 tab(s) orally once a day   bupropion 100 mg oral tablet  1 tab(s) orally 3 times a day   venlafaxine 50 mg oral tablet  1 tab(s) orally 3 times a day   alprazolam 1 mg oral tablet  1 tab(s) orally every 8 hours   fentanyl  50 microgram(s) injectable every hour, As needed, pain/agitation   acetaminophen  975 milligram(s)  every 8 hours   enoxaparin  40 milligram(s) subcutaneous    lorazepam  4 milligram(s) injectable every 2 hours, As needed, anxiety/agitation   propofol   intravenous dripp to keep sedated.   pantoprazole  40 milligram(s)  once a day    PRESCRIPTIONS: ELECTRONICALLY SUBMITTED  STOP TAKING THE FOLLOWING MEDICATION(S):    trazodone 50 mg oral tablet: 2 tab(s) orally once a day (at bedtime) ketorolac 10 mg oral tablet: 1 tab(s) orally 4 times a day  Physician's Instructions:  Diet Regular   Activity Limitations As tolerated   Return to Work Not Applicable   Time frame for Follow Up Appointment 1-2 days   Other Comments Further follow ups as advised by [facility] doctor.   Electronic Signatures: Vaughan Basta (MD)  (Signed 19-Jun-15 12:14)  Authored: ADMISSION DATE AND DIAGNOSIS, CHIEF COMPLAINT/HPI, Allergies, PERTINENT LABS, PERTINENT RADIOLOGY STUDIES, PERTINENT PAST HISTORY, HOSPITAL COURSE, DISCHARGE INSTRUCTIONS HOME MEDS, PATIENT  INSTRUCTIONS   Last Updated: 19-Jun-15 12:14 by Vaughan Basta (MD)

## 2014-06-21 NOTE — Op Note (Signed)
PATIENT NAME:  Patricia [name], Patricia [name] MR#:  [id number] DATE OF BIRTH:  1960-03-13  DATE OF PROCEDURE:  08/12/2013  PREOPERATIVE DIAGNOSES:  1.  Vent dependent.  2.  Prolonged intubation.   POSTOPERATIVE DIAGNOSES: 1.  Vent dependent.  2.  Prolonged intubation.   OPERATIVE PROCEDURE: Tracheostomy.   SURGEON: Vernie MurdersPaul Kripa Foskey, M.D.   ANESTHESIA: General.   COMPLICATIONS: None.   TOTAL ESTIMATED BLOOD LOSS: Minimal.   PROCEDURE: The patient was given general anesthesia through the previous oral endotracheal tube. The neck was prepped and draped in sterile fashion. The skin was marked and 2 mL of 1% Xylocaine with epinephrine 1:100,000 were infiltrated into the anterior skin. [name] small horizontal incision 1 inch wide was made at the anterior neck about one and [name] half fingerbreadths above the sternal notch. An incision was created through the skin, through the subcu, down to the strap muscles. These were divided in the midline. There is [name] broad, flat thyroid isthmus and I went just below that  to divide the tissues midline. To get down the pretracheal fascia, an inferior based U-shaped flap was created and this was sutured to the subcu tissues inferiorly with [name]  4-0 Vicryl. The trachea could be easily seen and just below the cuff. As the trach tube was pulled back, there were thickened mucous membranes where the cuff was sitting. [name] #6 Shiley DCT tube was placed. I previously had the cuff checked and moistened with water. Once it was placed, the cuff was inflated and there was good air exchange. The trach tube was secured around the neck first with 2-0 nylon suture to the skin and then trach dressing at the site. Sponge trach ties were placed around the neck as well. The patient tolerated the procedure well. She was awakened taken and taken to the recovery room in satisfactory condition. There were no operative complications.   ____________________________ Cammy CopaPaul H. Jazline Cumbee, MD phj:aw D: 08/12/2013 10:08:35  ET T: 08/12/2013 10:23:09 ET JOB#: [id number]  cc: Cammy CopaPaul H. Daivon Rayos, MD, <Dictator> Cammy CopaPAUL H Earland Reish MD ELECTRONICALLY SIGNED 08/12/2013 20:56

## 2014-06-21 NOTE — H&P (Signed)
PATIENT NAME:  Patricia [name], Patricia [name] MR#:  [id number] DATE OF BIRTH:  09/19/1960  DATE OF ADMISSION:  07/30/2013  REASON FOR ADMISSION: Unresponsiveness.   PRIMARY CARE PHYSICIAN: Nonlocal.   REFERRING PHYSICIAN: Dr. Sharman CheekPhillip [name]  HISTORY OF PRESENT ILLNESS: This is [name] 54 year old female with history of depression, who comes today after calling the police, saying that she needed to be involuntarily committed due to alcohol abuse. All they knew was that she had alcohol intoxication, but on arrival the patient was significantly unresponsive when coming out of the ambulance. Other than that, we do not have any further information. The patient is pretty much unresponsive. She had significant low blood pressures, ranging in the low 80s to 90s over 50s, tachycardia in the mid 90s. The patient has significant snoring. She is breathing occasionally up to 24, sometimes around 10. I counted 10 physically, and the patient has respiratory acidosis with hypoxia, for which I intubated the patient and sent her to the Critical Care Unit.   REVIEW OF SYSTEMS: Unable to obtain due to the patient's unresponsiveness.   PAST MEDICAL HISTORY: 1.  Depression.  2.  Anxiety.   This is obtained from previous records.   MEDICATIONS AT DISCHARGE: At this moment, we do not have any actual medication list, but from previous admission, the patient has been taking Xanax 1 mg 3 times [name] day, Wellbutrin 300 mg every 24 hours, venlafaxine extended release 150 mg once [name] day, trazodone 50 mg twice daily, ketorolac 10 mg 4 times [name] day, Abilify 2 mg once [name] day.   PAST SURGICAL HISTORY: Apparently, the patient had hysterectomy, unknown any other surgeries.   ALLERGIES: No known drug allergies.   SOCIAL HISTORY: From what I know from previous records, is that the patient used to smoke or smokes, unknown. Drinks alcohol, unknown the amount. Apparently lives by herself.   FAMILY HISTORY: Unable to obtain due to the patient's  unresponsiveness.   PHYSICAL EXAMINATION: VITAL SIGNS: Blood pressure 90s over 40s, right now is starting to improve, 122/60. Respiratory rate around 24, prior to intubation was around 10. Temperature 97.5, oxygen saturation 100% on 2 liters.  GENERAL: The patient is unresponsive. Not confident that she will protect her airway, as she is drunk and on benzodiazepines, with low blood pressures, potential to get worse and aspirate.  HEENT: Her pupils are round, sluggish reactive, 3 to 4 mm. Extraocular unable to assess. Mucosa are moist. Anicteric sclerae. Pink conjunctivae. No oral lesions.  NECK: Supple. No JVD. No thyromegaly. No adenopathy. No carotid bruits. No rigidity.  CARDIOVASCULAR: Regular rate and rhythm. No murmurs, rubs or gallops are appreciated. The patient is not tender to palpation of the chest, not responding to sternal rub, not responding or withdrawing to painful stimulus.  LUNGS: Clear without any wheezing or crepitus. No use of accessory muscles.  ABDOMEN: Soft, nontender, nondistended. No hepatosplenomegaly. No masses. Bowel sounds are positive.  EXTREMITIES: No edema, cyanosis or clubbing.  SKIN: No rashes, petechiae. No needle tracks.  MUSCULOSKELETAL: No joint effusions or joint swelling.  PSYCHIATRIC: Unable to assess.  NEUROLOGIC: The patient is obtunded, unresponsive.   RESULTS: Glucose 87, BUN 6, creatinine 0.67. Ethanol level 0.2. Calcium level 8.3. LFTs within normal limits. TSH 2.7. Benzos positive, MDMA positive. White blood count 4.5, hemoglobin 13, platelet count 200. Urinalysis negative for urinary tract infection or blood. Tylenol level and salicylate level negative. pH is 7.2, pCO2 68, pO2 64, HCO3 27. CT of the head negative for external lesions.  Chest x-ray negative for active cardiopulmonary disease. EKG: Mild prolonged QT, nonspecific T wave abnormalities in lower leads. By the way, patient is negative for tricyclics.   ASSESSMENT AND PLAN: [name] 54 year old  female with depression and anxiety, admitted for unresponsiveness.   1.  Unresponsiveness. This is likely secondary to [name] combination of alcohol and possible overdose with benzodiazepines. The patient did not give [name] the story to the paramedics about having [name] overdose, although the patient is very unresponsive, with significant low blood pressure that correlates with benzodiazepines overdose. The patient is given 3 liters of IV fluids. Her blood pressure remained on the low side. The patient has respiratory acidosis with hypercarbic respiratory failure, for which she is intubated and sent to the Critical Care Unit for airway protection and also due to the acidosis.  2.  Hypercapnic respiratory failure. The patient unable to maintain airway, with hypoxia and hypercarbia. Intubated, assist control, 16, tidal volume 450, PEEP of 5, and FiO2 of 50. Recheck ABGs later on.  3.  Alcohol abuse. The patient is on CIWA protocol. Since the patient is on benzodiazepines, sedation is going to be given with Precedex and fentanyl. Use benzodiazepines only in the case of seizures or delirium tremens.  4.  Hypotension. The patient has relative hypotension. After 2 liters of normal saline, we are going to continue normal saline with [name] banana bag for [name] total 125 mL an hour of fluid. The patient is starting to have improvement on blood pressures.  5.  Deep vein thrombosis prophylaxis with Lovenox; gastrointestinal prophylaxis with Protonix.   I spent about 45 minutes with this patient.   CODE STATUS: Full code.    ____________________________ Felipa Furnace, MD rsg:cg D: 07/30/2013 04:34:34 ET T: 07/30/2013 05:03:10 ET JOB#: [id number]  cc: Felipa Furnace, MD, <Dictator> Baron Parmelee Juanda Chance MD ELECTRONICALLY SIGNED 08/04/2013 19:32

## 2014-06-21 NOTE — Consult Note (Signed)
PATIENT NAME:  Patricia Patricia [name], Patricia Patricia [name] MR#:  [id number] DATE OF BIRTH:  1960/05/08  DATE OF CONSULTATION:  08/05/2013  REFERRING PHYSICIAN:  Freda MunroSaadat Khan, MD CONSULTING PHYSICIAN:  Cammy CopaPaul H. Atthew Coutant, MD   REASON FOR CONSULTATION:  Evaluate for possible tracheostomy.   HISTORY OF PRESENT ILLNESS: The patient is Patricia [name] 54 year old white female with history of depression on multiple medications. She also has had alcohol abuse. She called the EMS saying that she needed to be involuntary committed due to alcohol abuse. EMS came, found her unresponsive, and she was admitted to the hospital. Her respiratory rate decreased dramatically. She had respiratory acidosis and hypoxia and required emergent intubation. She was admitted to the ICU. This was on 07/30/2013.  On 08/01/2013, she had voluntary extubation and went back into respiratory distress. She had to be reintubated at that time. She has been still somewhat sedated and felt that she may be going through some alcohol withdrawal and possibly other drug withdrawals. She has had some atelectasis and increased markings of the lung bases suggestive of either possible aspiration or atelectasis. She was tried to be extubated today but she failed that. She then was Patricia [name] very difficult intubation with increasing edema around the larynx. She has Patricia [name] #6  oral endotracheal tube that was placed, and she is on some steroids now to help settle down the swelling. Consultation is placed for Patricia [name] tracheostomy tube to secure her airway and facilitate rehabilitation.   PAST MEDICAL HISTORY:  Significant for the depression and anxiety, on multiple medications.   CURRENT MEDICATIONS:  As noted in the chart.   REVIEW OF SYSTEMS: Unable to obtain because the patient is unresponsive.   DRUG ALLERGIES:  No known drug allergies.   SOCIAL HISTORY:  She has known alcohol abuse, but we do not know how much. The patient apparently lives by herself.   PHYSICAL EXAMINATION:  GENERAL:  The patient is  unresponsive in bed. She has an orotracheal tube in.  She has Patricia [name] fairly fat neck with loose skin. No evidence of bruising or swelling here. No crepitus. The larynx can be easily palpated and you can feel the neck landmarks well.   IMPRESSION:  The patient is unresponsive and has failed extubation 2 times. She has alcohol withdrawal, as well as now gram-positive infection in her lungs that will require longer ventilation. She needs Patricia [name] tracheostomy tube for securing her airway and facilitating rehabilitation. This will be planned for some time later in the week when time operative time is available. The earliest date would be Wednesday, but it could possibly be Thursday or Friday of this week as well. We will try to obtain consent from the family.    ____________________________ Cammy CopaPaul H. Marizol Borror, MD phj:dmm D: 08/05/2013 21:06:00 ET T: 08/05/2013 21:43:46 ET JOB#: [id number]  cc: Cammy CopaPaul H. Rameen Quinney, MD, <Dictator> Cammy CopaPAUL H Arzella Rehmann MD ELECTRONICALLY SIGNED 08/08/2013 9:14

## 2014-08-29 ENCOUNTER — Emergency Department: Payer: No Typology Code available for payment source

## 2014-08-29 ENCOUNTER — Other Ambulatory Visit: Payer: Self-pay

## 2014-08-29 ENCOUNTER — Emergency Department
Admission: EM | Admit: 2014-08-29 | Discharge: 2014-08-29 | Disposition: A | Discharge status: Home / Self Care | Payer: No Typology Code available for payment source | Attending: Emergency Medicine | Admitting: Emergency Medicine

## 2014-08-29 ENCOUNTER — Encounter: Payer: Self-pay | Admitting: Emergency Medicine

## 2014-08-29 DIAGNOSIS — R1013 Epigastric pain: Secondary | ICD-10-CM | POA: Diagnosis present

## 2014-08-29 DIAGNOSIS — Z79899 Other long term (current) drug therapy: Secondary | ICD-10-CM | POA: Insufficient documentation

## 2014-08-29 DIAGNOSIS — R112 Nausea with vomiting, unspecified: Secondary | ICD-10-CM | POA: Diagnosis not present

## 2014-08-29 DIAGNOSIS — Z880 Allergy status to penicillin: Secondary | ICD-10-CM | POA: Insufficient documentation

## 2014-08-29 HISTORY — DX: Unspecified foreign body in respiratory tract, part unspecified causing other injury, initial encounter: T17.908A

## 2014-08-29 HISTORY — DX: Depression, unspecified: F32.A

## 2014-08-29 HISTORY — DX: Major depressive disorder, single episode, unspecified: F32.9

## 2014-08-29 HISTORY — DX: Anxiety disorder, unspecified: F41.9

## 2014-08-29 HISTORY — DX: Alcohol abuse, uncomplicated: F10.10

## 2014-08-29 LAB — COMPREHENSIVE METABOLIC PANEL
ALK PHOS: 73 U/L (ref 38–126)
ALT: 14 U/L (ref 14–54)
AST: 25 U/L (ref 15–41)
Albumin: 5 g/dL (ref 3.5–5.0)
Anion gap: 10 (ref 5–15)
BILIRUBIN TOTAL: 0.8 mg/dL (ref 0.3–1.2)
BUN: 8 mg/dL (ref 6–20)
CO2: 24 mmol/L (ref 22–32)
CREATININE: 0.75 mg/dL (ref 0.44–1.00)
Calcium: 10.1 mg/dL (ref 8.9–10.3)
Chloride: 103 mmol/L (ref 101–111)
GFR calc Af Amer: >60 mL/min (ref >60)
GFR calc non Af Amer: >60 mL/min (ref >60)
GLUCOSE: 98 mg/dL (ref 65–99)
POTASSIUM: 3.9 mmol/L (ref 3.5–5.1)
SODIUM: 137 mmol/L (ref 135–145)
Total Protein: 8.2 g/dL — ABNORMAL HIGH (ref 6.5–8.1)

## 2014-08-29 LAB — CBC WITH DIFFERENTIAL/PLATELET
BASOS PCT: 1 %
Basophils Absolute: 0 10*3/uL (ref 0–0.1)
EOS PCT: 0 %
Eosinophils Absolute: 0 10*3/uL (ref 0–0.7)
HCT: 46.6 % (ref 35.0–47.0)
Hemoglobin: 15.5 g/dL (ref 12.0–16.0)
Lymphocytes Relative: 22 %
Lymphs Abs: 1.3 10*3/uL (ref 1.0–3.6)
MCH: 31.9 pg (ref 26.0–34.0)
MCHC: 33.2 g/dL (ref 32.0–36.0)
MCV: 96.1 fL (ref 80.0–100.0)
MONO ABS: 0.4 10*3/uL (ref 0.2–0.9)
Monocytes Relative: 8 %
Neutro Abs: 4 10*3/uL (ref 1.4–6.5)
Neutrophils Relative %: 69 %
Platelets: 222 10*3/uL (ref 150–440)
RBC: 4.85 MIL/uL (ref 3.80–5.20)
RDW: 15.8 % — ABNORMAL HIGH (ref 11.5–14.5)
WBC: 5.7 10*3/uL (ref 3.6–11.0)

## 2014-08-29 LAB — URINALYSIS COMPLETE WITH MICROSCOPIC (ARMC ONLY)
Bacteria, UA: NONE SEEN
Bilirubin Urine: NEGATIVE
Glucose, UA: NEGATIVE mg/dL
Hgb urine dipstick: NEGATIVE
LEUKOCYTES UA: NEGATIVE
Nitrite: NEGATIVE
PROTEIN: NEGATIVE mg/dL
SPECIFIC GRAVITY, URINE: 1.013 (ref 1.005–1.030)
pH: 9 — ABNORMAL HIGH (ref 5.0–8.0)

## 2014-08-29 LAB — TROPONIN I: Troponin I: <0.03 ng/mL (ref <0.031)

## 2014-08-29 LAB — LIPASE, BLOOD: Lipase: 50 U/L (ref 22–51)

## 2014-08-29 MED ORDER — ONDANSETRON HCL 4 MG/2ML IJ SOLN
4.0000 mg | Freq: Once | INTRAMUSCULAR | Status: AC
  Administered 2014-08-29: 4 mg via INTRAVENOUS

## 2014-08-29 MED ORDER — METOCLOPRAMIDE HCL 10 MG PO TABS: 10.0000 mg | ORAL_TABLET | Freq: Four times a day (QID) | ORAL | Status: DC | PRN

## 2014-08-29 MED ORDER — FAMOTIDINE 40 MG PO TABS: 40.0000 mg | ORAL_TABLET | Freq: Every evening | ORAL | Status: DC

## 2014-08-29 MED ORDER — MORPHINE SULFATE 4 MG/ML IJ SOLN
4.0000 mg | Freq: Once | INTRAMUSCULAR | Status: AC
  Administered 2014-08-29: 4 mg via INTRAVENOUS

## 2014-08-29 MED ORDER — IOHEXOL 350 MG/ML SOLN
80.0000 mL | Freq: Once | INTRAVENOUS | Status: AC | PRN
  Administered 2014-08-29: 100 mL via INTRAVENOUS

## 2014-08-29 MED ORDER — ONDANSETRON HCL 4 MG/2ML IJ SOLN
INTRAMUSCULAR | Status: AC
  Administered 2014-08-29: 4 mg via INTRAVENOUS
  Filled 2014-08-29: qty 2

## 2014-08-29 MED ORDER — SODIUM CHLORIDE 0.9 % IV BOLUS (SEPSIS)
1000.0000 mL | Freq: Once | INTRAVENOUS | Status: AC
  Administered 2014-08-29: 1000 mL via INTRAVENOUS

## 2014-08-29 MED ORDER — ONDANSETRON HCL 4 MG/2ML IJ SOLN
4.0000 mg | Freq: Once | INTRAMUSCULAR | Status: AC
Start: 2014-08-29 — End: 2014-08-29
  Administered 2014-08-29: 4 mg via INTRAVENOUS

## 2014-08-29 MED ORDER — DICYCLOMINE HCL 20 MG PO TABS: 20.0000 mg | ORAL_TABLET | Freq: Three times a day (TID) | ORAL | Status: DC | PRN

## 2014-08-29 MED ORDER — MORPHINE SULFATE 4 MG/ML IJ SOLN
INTRAMUSCULAR | Status: AC
  Administered 2014-08-29: 4 mg via INTRAVENOUS
  Filled 2014-08-29: qty 1

## 2014-08-29 MED ORDER — IOHEXOL 240 MG/ML SOLN
25.0000 mL | Freq: Once | INTRAMUSCULAR | Status: AC | PRN
  Administered 2014-08-29: 25 mL via ORAL

## 2014-08-29 NOTE — ED Notes (Signed)
Pt here with c/o generalized abd pain that began yesterday, with nausea and vomiting. Dry heaving in triage. Denies constipation or diarrhea.

## 2014-08-29 NOTE — ED Provider Notes (Addendum)
[facility] Provider Note  ____________________________________________  Time seen: Approximately 11 AM  I have reviewed the triage vital signs and the nursing notes.   HISTORY  Chief Complaint Abdominal Pain and Nausea    HPI Patricia [name] is a 54 y.o. female with a history of aspiration requiring tracheostomy presents to the emergency department for epigastric pain that started yesterday. She says that she has been having dry heaves but no overt vomiting. Denies diarrhea. No dysuria. Says that the pain is cramping. Pain is moderate to severe. History of a hysterectomy in the past. Done prophylactically because of history of breast cancer and ovarian cancer in the family.   Past Medical History  Diagnosis Date  . Aspiration into airway   . Depression unk  . Anxiety unk  . Alcohol abuse unk    There are no active problems to display for this patient.   Past Surgical History  Procedure Laterality Date  . Tracheal surgery    . Abdominal hysterectomy      Current Outpatient Rx  Name  Route  Sig  Dispense  Refill  . albuterol (VENTOLIN HFA) 108 (90 BASE) MCG/ACT inhaler   Inhalation   Inhale 2 puffs into the lungs every 6 (six) hours as needed for wheezing or shortness of breath.          Marland Kitchen. buPROPion (WELLBUTRIN SR) 150 MG 12 hr tablet   Oral   Take 1 tablet by mouth 2 (two) times daily.         . clonazePAM (KLONOPIN) 1 MG tablet   Oral   Take 1 tablet by mouth 3 (three) times daily.         Marland Kitchen. FLUoxetine (PROZAC) 40 MG capsule   Oral   Take 1 capsule by mouth daily.         Marland Kitchen. gabapentin (NEURONTIN) 300 MG capsule   Oral   Take 1 capsule by mouth at bedtime.         . lamoTRIgine (LAMICTAL) 100 MG tablet   Oral   Take 1 tablet by mouth every morning.         . trazodone (DESYREL) 300 MG tablet   Oral   Take 1 tablet by mouth at bedtime.           Allergies Penicillins  History reviewed. No  pertinent family history.  Social History History  Substance Use Topics  . Smoking status: Never Smoker   . Smokeless tobacco: Not on file  . Alcohol Use: No     Comment: former alcoholic     Review of Systems Constitutional: No fever/chills Eyes: No visual changes. ENT: No sore throat. Cardiovascular: Denies chest pain. Respiratory: Denies shortness of breath. Gastrointestinal: No constipation. Genitourinary: Negative for dysuria. Musculoskeletal: Negative for back pain. Skin: Negative for rash. Neurological: Negative for headaches, focal weakness or numbness.  10-point ROS otherwise negative.  ____________________________________________   PHYSICAL EXAM:  VITAL SIGNS: ED Triage Vitals  Enc Vitals Group     BP 08/29/14 0955 130/92 mmHg     Pulse Rate 08/29/14 0955 98     Resp 08/29/14 0955 18     Temp 08/29/14 0955 98.5 F (36.9 C)     Temp Source 08/29/14 0955 Oral     SpO2 08/29/14 0955 99 %     Weight 08/29/14 0955 130 lb (58.968 kg)     Height 08/29/14 0955 5\' 6"  (1.676 m)     Head Cir --  Peak Flow --      Pain Score 08/29/14 0955 9     Pain Loc --      Pain Edu? --      Excl. in GC? --     Constitutional: Alert and oriented. Well appearing and in no acute distress. Eyes: Conjunctivae are normal. PERRL. EOMI. Head: Atraumatic. Nose: No congestion/rhinnorhea. Mouth/Throat: Mucous membranes are moist.  Oropharynx non-erythematous. Neck: No stridor.   Cardiovascular: Normal rate, regular rhythm. Grossly normal heart sounds.  Good peripheral circulation. Respiratory: Normal respiratory effort.  No retractions. Lungs CTAB. Gastrointestinal: Soft with tenderness to palpation to the right lower suprapubic left lower and left upper quadrants.  worse in the epigastrium. She says that her pain is relieved with right upper quadrant palpation.. No distention. No abdominal bruits. No CVA tenderness. Musculoskeletal: No lower extremity tenderness nor edema.  No  joint effusions. Neurologic:  Normal speech and language. No gross focal neurologic deficits are appreciated. Speech is normal. No gait instability. Skin:  Skin is warm, dry and intact. No rash noted. Psychiatric: Mood and affect are normal. Speech and behavior are normal.  ____________________________________________   LABS (all labs ordered are listed, but only abnormal results are displayed)  Labs Reviewed  CBC WITH DIFFERENTIAL/PLATELET - Abnormal; Notable for the following:    RDW 15.8 (*)    All other components within normal limits  COMPREHENSIVE METABOLIC PANEL - Abnormal; Notable for the following:    Total Protein 8.2 (*)    All other components within normal limits  URINALYSIS COMPLETEWITH MICROSCOPIC ([facility] ONLY) - Abnormal; Notable for the following:    Color, Urine YELLOW (*)    APPearance CLEAR (*)    Ketones, ur 2+ (*)    pH 9.0 (*)    Squamous Epithelial / LPF 0-5 (*)    All other components within normal limits  LIPASE, BLOOD  TROPONIN I   ____________________________________________  EKG  ED ECG REPORT I, Arelia Longest, the attending physician, personally viewed and interpreted this ECG.   Date: 08/29/2014  EKG Time: 1158  Rate: 86  Rhythm: normal sinus rhythm  Axis: Normal axis  Intervals:Prolonged QT interval.  ST&T Change: No ST elevations or depressions. No abnormal T-wave inversions.  ____________________________________________  RADIOLOGY  CT of the abdomen and pelvis without any acute abnormality. ____________________________________________   PROCEDURES   ____________________________________________   INITIAL IMPRESSION / ASSESSMENT AND PLAN / ED COURSE  Pertinent labs & imaging results that were available during my care of the patient were reviewed by me and considered in my medical decision making (see chart for details).  ----------------------------------------- 3:36 PM on  08/29/2014 -----------------------------------------  Patient resting comfortably at this time. Pain improved and nausea improved after medication. Able to tolerate by mouth contrast. No surgical cause in the abdomen for her pain at this time. We will discharge to home with symptomatic treatment. Possible enteritis. ____________________________________________   FINAL CLINICAL IMPRESSION(S) / ED DIAGNOSES  Acute epigastric pain. Acute nausea and vomiting. Initial visit.    Myrna Blazer, MD 08/29/14 1537  Call back in the room because the patient is concerned because of there not being any findings on her blood and imaging studies today. The patient does appear improved and is no longer having any dry heaves. I gave her a work note for tomorrow and counseled her and her family to return if the symptoms persist or worsen. However, at this time her labs and imaging a reassuring. Her EKG is also reassuring.  There was no shortness of breath or chest pain associated. Unlikely PE. No tachycardia, hypotension or hypoxia.  Myrna Blazer, MD 08/29/14 [phone]

## 2014-08-29 NOTE — Discharge Instructions (Signed)

## 2015-02-06 ENCOUNTER — Emergency Department: Payer: No Typology Code available for payment source

## 2015-02-06 ENCOUNTER — Encounter: Payer: Self-pay | Admitting: Intensive Care

## 2015-02-06 ENCOUNTER — Observation Stay
Admission: EM | Admit: 2015-02-06 | Discharge: 2015-02-08 | Disposition: A | Discharge status: Home / Self Care | Payer: No Typology Code available for payment source | Attending: Internal Medicine | Admitting: Internal Medicine

## 2015-02-06 DIAGNOSIS — M50221 Other cervical disc displacement at C4-C5 level: Secondary | ICD-10-CM | POA: Diagnosis not present

## 2015-02-06 DIAGNOSIS — R531 Weakness: Secondary | ICD-10-CM | POA: Insufficient documentation

## 2015-02-06 DIAGNOSIS — Z79899 Other long term (current) drug therapy: Secondary | ICD-10-CM | POA: Insufficient documentation

## 2015-02-06 DIAGNOSIS — F419 Anxiety disorder, unspecified: Secondary | ICD-10-CM | POA: Insufficient documentation

## 2015-02-06 DIAGNOSIS — R600 Localized edema: Secondary | ICD-10-CM | POA: Diagnosis not present

## 2015-02-06 DIAGNOSIS — M50321 Other cervical disc degeneration at C4-C5 level: Secondary | ICD-10-CM | POA: Insufficient documentation

## 2015-02-06 DIAGNOSIS — F17210 Nicotine dependence, cigarettes, uncomplicated: Secondary | ICD-10-CM | POA: Diagnosis not present

## 2015-02-06 DIAGNOSIS — H532 Diplopia: Secondary | ICD-10-CM | POA: Diagnosis present

## 2015-02-06 DIAGNOSIS — M50322 Other cervical disc degeneration at C5-C6 level: Secondary | ICD-10-CM | POA: Diagnosis not present

## 2015-02-06 DIAGNOSIS — Z880 Allergy status to penicillin: Secondary | ICD-10-CM | POA: Diagnosis not present

## 2015-02-06 DIAGNOSIS — I611 Nontraumatic intracerebral hemorrhage in hemisphere, cortical: Secondary | ICD-10-CM | POA: Insufficient documentation

## 2015-02-06 DIAGNOSIS — E785 Hyperlipidemia, unspecified: Secondary | ICD-10-CM | POA: Insufficient documentation

## 2015-02-06 DIAGNOSIS — F1010 Alcohol abuse, uncomplicated: Secondary | ICD-10-CM | POA: Diagnosis not present

## 2015-02-06 DIAGNOSIS — R251 Tremor, unspecified: Secondary | ICD-10-CM | POA: Insufficient documentation

## 2015-02-06 DIAGNOSIS — R270 Ataxia, unspecified: Secondary | ICD-10-CM | POA: Diagnosis not present

## 2015-02-06 DIAGNOSIS — F329 Major depressive disorder, single episode, unspecified: Secondary | ICD-10-CM | POA: Insufficient documentation

## 2015-02-06 DIAGNOSIS — R260 Ataxic gait: Secondary | ICD-10-CM

## 2015-02-06 LAB — CBC
HEMATOCRIT: 43 % (ref 35.0–47.0)
Hemoglobin: 14.1 g/dL (ref 12.0–16.0)
MCH: 31.3 pg (ref 26.0–34.0)
MCHC: 32.8 g/dL (ref 32.0–36.0)
MCV: 95.3 fL (ref 80.0–100.0)
PLATELETS: 201 10*3/uL (ref 150–440)
RBC: 4.52 MIL/uL (ref 3.80–5.20)
RDW: 13.8 % (ref 11.5–14.5)
WBC: 4.8 10*3/uL (ref 3.6–11.0)

## 2015-02-06 LAB — URINALYSIS COMPLETE WITH MICROSCOPIC (ARMC ONLY)
BACTERIA UA: NONE SEEN
Bilirubin Urine: NEGATIVE
Glucose, UA: NEGATIVE mg/dL
Hgb urine dipstick: NEGATIVE
Ketones, ur: NEGATIVE mg/dL
NITRITE: NEGATIVE
PROTEIN: NEGATIVE mg/dL
SPECIFIC GRAVITY, URINE: 1.013 (ref 1.005–1.030)
pH: 7 (ref 5.0–8.0)

## 2015-02-06 LAB — DIFFERENTIAL
BASOS ABS: 0 10*3/uL (ref 0–0.1)
BASOS PCT: 1 %
Eosinophils Absolute: 0 10*3/uL (ref 0–0.7)
Eosinophils Relative: 1 %
LYMPHS PCT: 30 %
Lymphs Abs: 1.5 10*3/uL (ref 1.0–3.6)
MONO ABS: 0.3 10*3/uL (ref 0.2–0.9)
MONOS PCT: 6 %
NEUTROS ABS: 3 10*3/uL (ref 1.4–6.5)
Neutrophils Relative %: 62 %

## 2015-02-06 LAB — URINE DRUG SCREEN, QUALITATIVE (ARMC ONLY)
Amphetamines, Ur Screen: NOT DETECTED
BARBITURATES, UR SCREEN: NOT DETECTED
Benzodiazepine, Ur Scrn: POSITIVE — AB
CANNABINOID 50 NG, UR ~~LOC~~: NOT DETECTED
COCAINE METABOLITE, UR ~~LOC~~: NOT DETECTED
MDMA (ECSTASY) UR SCREEN: NOT DETECTED
Methadone Scn, Ur: NOT DETECTED
OPIATE, UR SCREEN: NOT DETECTED
PHENCYCLIDINE (PCP) UR S: NOT DETECTED
Tricyclic, Ur Screen: NOT DETECTED

## 2015-02-06 LAB — COMPREHENSIVE METABOLIC PANEL
ALK PHOS: 63 U/L (ref 38–126)
ALT: 30 U/L (ref 14–54)
AST: 34 U/L (ref 15–41)
Albumin: 4.6 g/dL (ref 3.5–5.0)
Anion gap: 6 (ref 5–15)
BUN: 8 mg/dL (ref 6–20)
CALCIUM: 9.6 mg/dL (ref 8.9–10.3)
CHLORIDE: 100 mmol/L — AB (ref 101–111)
CO2: 30 mmol/L (ref 22–32)
CREATININE: 0.85 mg/dL (ref 0.44–1.00)
GFR calc Af Amer: >60 mL/min (ref >60)
GFR calc non Af Amer: >60 mL/min (ref >60)
GLUCOSE: 84 mg/dL (ref 65–99)
Potassium: 4.4 mmol/L (ref 3.5–5.1)
SODIUM: 136 mmol/L (ref 135–145)
Total Bilirubin: 0.8 mg/dL (ref 0.3–1.2)
Total Protein: 7.6 g/dL (ref 6.5–8.1)

## 2015-02-06 LAB — APTT: APTT: 41 s — AB (ref 24–36)

## 2015-02-06 LAB — ETHANOL: ALCOHOL ETHYL (B): 12 mg/dL — AB (ref <5)

## 2015-02-06 LAB — TROPONIN I: Troponin I: <0.03 ng/mL (ref <0.031)

## 2015-02-06 LAB — PROTIME-INR
INR: 0.99
Prothrombin Time: 13.3 seconds (ref 11.4–15.0)

## 2015-02-06 LAB — MAGNESIUM: MAGNESIUM: 2.1 mg/dL (ref 1.7–2.4)

## 2015-02-06 MED ORDER — ADULT MULTIVITAMIN W/MINERALS CH
1.0000 | ORAL_TABLET | Freq: Every day | ORAL | Status: DC
  Administered 2015-02-06 – 2015-02-08 (×3): 1 via ORAL
  Filled 2015-02-06 (×3): qty 1

## 2015-02-06 MED ORDER — LAMOTRIGINE 100 MG PO TABS
200.0000 mg | ORAL_TABLET | Freq: Every day | ORAL | Status: DC
  Administered 2015-02-07 – 2015-02-08 (×2): 200 mg via ORAL
  Filled 2015-02-06 (×2): qty 2

## 2015-02-06 MED ORDER — ASPIRIN 81 MG PO CHEW
81.0000 mg | CHEWABLE_TABLET | Freq: Every day | ORAL | Status: DC
  Administered 2015-02-07 – 2015-02-08 (×2): 81 mg via ORAL
  Filled 2015-02-06 (×2): qty 1

## 2015-02-06 MED ORDER — SODIUM CHLORIDE 0.9 % IV SOLN
INTRAVENOUS | Status: DC
  Administered 2015-02-06 – 2015-02-07 (×2): via INTRAVENOUS

## 2015-02-06 MED ORDER — SODIUM CHLORIDE 0.9 % IJ SOLN: 3.0000 mL | INTRAMUSCULAR | Status: DC | PRN

## 2015-02-06 MED ORDER — CLOPIDOGREL BISULFATE 75 MG PO TABS
75.0000 mg | ORAL_TABLET | Freq: Every day | ORAL | Status: DC
  Administered 2015-02-06 – 2015-02-08 (×3): 75 mg via ORAL
  Filled 2015-02-06 (×3): qty 1

## 2015-02-06 MED ORDER — GABAPENTIN 300 MG PO CAPS: 300.0000 mg | ORAL_CAPSULE | Freq: Every day | ORAL | Status: DC

## 2015-02-06 MED ORDER — HEPARIN SODIUM (PORCINE) 5000 UNIT/ML IJ SOLN
5000.0000 [IU] | Freq: Three times a day (TID) | INTRAMUSCULAR | Status: DC
Start: 2015-02-06 — End: 2015-02-08
  Administered 2015-02-06 – 2015-02-08 (×5): 5000 [IU] via SUBCUTANEOUS
  Filled 2015-02-06 (×5): qty 1

## 2015-02-06 MED ORDER — ONDANSETRON HCL 4 MG/2ML IJ SOLN: 4.0000 mg | Freq: Four times a day (QID) | INTRAMUSCULAR | Status: DC | PRN

## 2015-02-06 MED ORDER — ALBUTEROL SULFATE (2.5 MG/3ML) 0.083% IN NEBU: 2.5000 mg | INHALATION_SOLUTION | RESPIRATORY_TRACT | Status: DC | PRN

## 2015-02-06 MED ORDER — ASPIRIN 81 MG PO CHEW
324.0000 mg | CHEWABLE_TABLET | Freq: Once | ORAL | Status: AC
  Administered 2015-02-06: 324 mg via ORAL

## 2015-02-06 MED ORDER — SODIUM CHLORIDE 0.9 % IJ SOLN
3.0000 mL | Freq: Two times a day (BID) | INTRAMUSCULAR | Status: DC
  Administered 2015-02-06: 3 mL via INTRAVENOUS

## 2015-02-06 MED ORDER — FOLIC ACID 5 MG/ML IJ SOLN
1.0000 mg | Freq: Every day | INTRAMUSCULAR | Status: DC
  Filled 2015-02-06: qty 0.2

## 2015-02-06 MED ORDER — SODIUM CHLORIDE 0.9 % IV SOLN: 250.0000 mL | INTRAVENOUS | Status: DC | PRN

## 2015-02-06 MED ORDER — ACETAMINOPHEN 650 MG RE SUPP: 650.0000 mg | Freq: Four times a day (QID) | RECTAL | Status: DC | PRN

## 2015-02-06 MED ORDER — THIAMINE HCL 100 MG/ML IJ SOLN
100.0000 mg | Freq: Once | INTRAMUSCULAR | Status: AC
  Administered 2015-02-06: 100 mg via INTRAVENOUS
  Filled 2015-02-06: qty 2

## 2015-02-06 MED ORDER — TRAZODONE HCL 100 MG PO TABS
300.0000 mg | ORAL_TABLET | Freq: Every day | ORAL | Status: DC
  Administered 2015-02-06: 300 mg via ORAL
  Filled 2015-02-06 (×2): qty 3

## 2015-02-06 MED ORDER — ALBUTEROL SULFATE HFA 108 (90 BASE) MCG/ACT IN AERS: 2.0000 | INHALATION_SPRAY | Freq: Four times a day (QID) | RESPIRATORY_TRACT | Status: DC | PRN

## 2015-02-06 MED ORDER — THIAMINE HCL 100 MG/ML IJ SOLN: 100.0000 mg | Freq: Every day | INTRAMUSCULAR | Status: DC

## 2015-02-06 MED ORDER — ASPIRIN 81 MG PO CHEW
CHEWABLE_TABLET | ORAL | Status: AC
  Administered 2015-02-06: 324 mg via ORAL
  Filled 2015-02-06: qty 4

## 2015-02-06 MED ORDER — VITAMIN B-1 100 MG PO TABS
100.0000 mg | ORAL_TABLET | Freq: Every day | ORAL | Status: DC
  Administered 2015-02-06 – 2015-02-07 (×2): 100 mg via ORAL
  Filled 2015-02-06 (×2): qty 1

## 2015-02-06 MED ORDER — FOLIC ACID 1 MG PO TABS
1.0000 mg | ORAL_TABLET | Freq: Every day | ORAL | Status: DC
  Administered 2015-02-06 – 2015-02-07 (×2): 1 mg via ORAL
  Filled 2015-02-06 (×2): qty 1

## 2015-02-06 MED ORDER — ONDANSETRON HCL 4 MG PO TABS
4.0000 mg | ORAL_TABLET | Freq: Four times a day (QID) | ORAL | Status: DC | PRN
Start: 2015-02-06 — End: 2015-02-08

## 2015-02-06 MED ORDER — ALPRAZOLAM ER 1 MG PO TB24
2.0000 mg | ORAL_TABLET | Freq: Two times a day (BID) | ORAL | Status: DC
Start: 2015-02-06 — End: 2015-02-08
  Administered 2015-02-06 – 2015-02-08 (×4): 2 mg via ORAL
  Filled 2015-02-06 (×4): qty 2

## 2015-02-06 MED ORDER — LORAZEPAM 1 MG PO TABS: 1.0000 mg | ORAL_TABLET | Freq: Four times a day (QID) | ORAL | Status: DC | PRN

## 2015-02-06 MED ORDER — FLUOXETINE HCL 20 MG PO CAPS
40.0000 mg | ORAL_CAPSULE | Freq: Every day | ORAL | Status: DC
  Administered 2015-02-07 – 2015-02-08 (×2): 40 mg via ORAL
  Filled 2015-02-06 (×3): qty 2

## 2015-02-06 MED ORDER — ACETAMINOPHEN 325 MG PO TABS: 650.0000 mg | ORAL_TABLET | Freq: Four times a day (QID) | ORAL | Status: DC | PRN

## 2015-02-06 MED ORDER — LAMOTRIGINE 100 MG PO TABS: 100.0000 mg | ORAL_TABLET | ORAL | Status: DC

## 2015-02-06 MED ORDER — LORAZEPAM 2 MG/ML IJ SOLN: 1.0000 mg | Freq: Four times a day (QID) | INTRAMUSCULAR | Status: DC | PRN

## 2015-02-06 MED ORDER — BUPROPION HCL ER (SR) 150 MG PO TB12
150.0000 mg | ORAL_TABLET | Freq: Two times a day (BID) | ORAL | Status: DC
  Administered 2015-02-07 – 2015-02-08 (×3): 150 mg via ORAL
  Filled 2015-02-06 (×4): qty 1

## 2015-02-06 NOTE — ED Provider Notes (Signed)
[facility] Emergency Department Provider Note  ____________________________________________  Time seen: Seen upon arrival to the emergency department  I have reviewed the triage vital signs and the nursing notes.   HISTORY  Chief Complaint Dizziness    HPI Patricia [name] is a 54 y.o. female with a history of anxiety and alcohol abuse who is presenting today with double vision. She says that this started at the earliest at about 11 AM this morning. She says that she has also had associated unsteady gait over the past several weeks. She says that the last time she had double vision like this was about a month ago. She denies any trauma or falls. Said that this morning at about 12:30 in addition to double vision she also had an episode of tremulousness that caused alarm at her AA meeting with subsequent transfer to the emergency department for further evaluation. The patient denies any pain at this time. Denies any weakness at this time. Says that she is seeing "too many" during the exam. Says that the double vision is in a horizontal orientation.   Past Medical History  Diagnosis Date  . Aspiration into airway   . Depression unk  . Anxiety unk  . Alcohol abuse unk    There are no active problems to display for this patient.   Past Surgical History  Procedure Laterality Date  . Tracheal surgery    . Abdominal hysterectomy      Current Outpatient Rx  Name  Route  Sig  Dispense  Refill  . albuterol (VENTOLIN HFA) 108 (90 BASE) MCG/ACT inhaler   Inhalation   Inhale 2 puffs into the lungs every 6 (six) hours as needed for wheezing or shortness of breath.          Marland Kitchen. buPROPion (WELLBUTRIN SR) 150 MG 12 hr tablet   Oral   Take 1 tablet by mouth 2 (two) times daily.         . clonazePAM (KLONOPIN) 1 MG tablet   Oral   Take 1 tablet by mouth 3 (three) times daily.         Marland Kitchen. dicyclomine (BENTYL) 20 MG tablet   Oral   Take 1 tablet (20 mg total)  by mouth 3 (three) times daily as needed for spasms.   30 tablet   0   . famotidine (PEPCID) 40 MG tablet   Oral   Take 1 tablet (40 mg total) by mouth every evening.   30 tablet   1   . FLUoxetine (PROZAC) 40 MG capsule   Oral   Take 1 capsule by mouth daily.         Marland Kitchen. gabapentin (NEURONTIN) 300 MG capsule   Oral   Take 1 capsule by mouth at bedtime.         . lamoTRIgine (LAMICTAL) 100 MG tablet   Oral   Take 1 tablet by mouth every morning.         . metoCLOPramide (REGLAN) 10 MG tablet   Oral   Take 1 tablet (10 mg total) by mouth every 6 (six) hours as needed for nausea or vomiting.   12 tablet   1   . trazodone (DESYREL) 300 MG tablet   Oral   Take 1 tablet by mouth at bedtime.           Allergies Penicillins  History reviewed. No pertinent family history.  Social History Social History  Substance Use Topics  . Smoking status: Light Tobacco  Smoker    Types: Cigarettes  . Smokeless tobacco: Never Used  . Alcohol Use: No     Comment: former alcoholic "sober for a month now"    Review of Systems Constitutional: No fever/chills Eyes:As above enT: No sore throat. Cardiovascular: Denies chest pain. Respiratory: Denies shortness of breath. Gastrointestinal: No abdominal pain.  No nausea, no vomiting.  No diarrhea.  No constipation. Genitourinary: Negative for dysuria. Musculoskeletal: Negative for back pain. Skin: Negative for rash. Neurological: Negative for headaches, focal weakness or numbness.  10-point ROS otherwise negative.  ____________________________________________   PHYSICAL EXAM:  VITAL SIGNS: ED Triage Vitals  Enc Vitals Group     BP 02/06/15 1350 140/85 mmHg     Pulse --      Resp --      Temp --      Temp src --      SpO2 --      Weight 02/06/15 1350 121 lb (54.885 kg)     Height 02/06/15 1350  (1.676 m)     Head Cir --      Peak Flow --      Pain Score --      Pain Loc --      Pain Edu? --      Excl. in  GC? --     Constitutional: Alert and oriented. Well appearing and in no acute distress. Eyes: Conjunctivae are normal. PERRL. EOMI. Head: Atraumatic. Nose: No congestion/rhinnorhea. Mouth/Throat: Mucous membranes are moist.  Oropharynx non-erythematous. Neck: No stridor.   Cardiovascular: Normal rate, regular rhythm. Grossly normal heart sounds.  Good peripheral circulation. Respiratory: Normal respiratory effort.  No retractions. Lungs CTAB. Gastrointestinal: Soft and nontender. No distention. No abdominal bruits. No CVA tenderness. Musculoskeletal: No lower extremity tenderness nor edema.  No joint effusions. Neurologic:  Normal speech and language. No gross focal neurologic deficits are appreciated.Mild bilateral upper extremity tremulousness at rest. Skin:  Skin is warm, dry and intact. No rash noted. Psychiatric: Mood and affect are normal. Speech and behavior are normal.  NIH Stroke Scale  Person Administering Scale: Arelia Longest  Administer stroke scale items in the order listed. Record performance in each category after each subscale exam. Do not go back and change scores. Follow directions provided for each exam technique. Scores should reflect what the patient does, not what the clinician thinks the patient can do. The clinician should record answers while administering the exam and work quickly. Except where indicated, the patient should not be coached (i.e., repeated requests to patient to make a special effort).   1a  Level of consciousness: 0=alert; keenly responsive  1b. LOC questions:  0=Performs both tasks correctly  1c. LOC commands: 0=Performs both tasks correctly  2.  Best Gaze: 0=normal  3.  Visual: 0=No visual loss  4. Facial Palsy: 0=Normal symmetric movement  5a.  Motor left arm: 0=No drift, limb holds 90 (or 45) degrees for full 10 seconds  5b.  Motor right arm: 0=No drift, limb holds 90 (or 45) degrees for full 10 seconds  6a. motor left leg: 0=No  drift, limb holds 90 (or 45) degrees for full 10 seconds  6b  Motor right leg:  0=No drift, limb holds 90 (or 45) degrees for full 10 seconds  7. Limb Ataxia: 0=Absent  8.  Sensory: 0=Normal; no sensory loss  9. Best Language:  0=No aphasia, normal  10. Dysarthria: 0=Normal  11. Extinction and Inattention: 0=No abnormality  12. Distal motor function: 0=Normal  Total:   0    ____________________________________________   LABS (all labs ordered are listed, but only abnormal results are displayed)  Labs Reviewed  APTT - Abnormal; Notable for the following:    aPTT 41 (*)    All other components within normal limits  COMPREHENSIVE METABOLIC PANEL - Abnormal; Notable for the following:    Chloride 100 (*)    All other components within normal limits  URINE DRUG SCREEN, QUALITATIVE ([facility] ONLY) - Abnormal; Notable for the following:    Benzodiazepine, Ur Scrn POSITIVE (*)    All other components within normal limits  URINALYSIS COMPLETEWITH MICROSCOPIC ([facility] ONLY) - Abnormal; Notable for the following:    Color, Urine YELLOW (*)    APPearance CLEAR (*)    Leukocytes, UA 2+ (*)    Squamous Epithelial / LPF 0-5 (*)    All other components within normal limits  PROTIME-INR  CBC  DIFFERENTIAL  TROPONIN I  ETHANOL  I-STAT TROPOININ, ED   ____________________________________________  EKG  ED ECG REPORT I, Arelia Longest, the attending physician, personally viewed and interpreted this ECG.   Date: 02/06/2015  EKG Time: 1321  Rate: 79  Rhythm: normal sinus rhythm  Axis: Normal axis  Intervals:none  ST&T Change: No ST segment elevation or depression. No abnormal T-wave inversion.  ____________________________________________  RADIOLOGY  CT of the brain which is read as normal.  ____________________________________________   PROCEDURES    ____________________________________________   INITIAL IMPRESSION / ASSESSMENT AND PLAN / ED COURSE  Pertinent labs &  imaging results that were available during my care of the patient were reviewed by me and considered in my medical decision making (see chart for details).  Patient made stroke alert. Currently being evaluated by the on-call tele-neurologist.   ----------------------------------------- 3:21 PM on 02/06/2015 -----------------------------------------  Discussed the case with Dr. Cleophus Molt who is the neurologist of the specialist on-call service. He is evaluated the patient and recommends further workup as an inpatient including further imaging with MRI. We'll also give thiamine and folic acid. Patient understands the plan for admission and will comply. 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 Signed out to Dr. Imogene Burn. ____________________________________________   FINAL CLINICAL IMPRESSION(S) / ED DIAGNOSES  Diplopia. Ataxic gait.    Myrna Blazer, MD 02/06/15 [phone]

## 2015-02-06 NOTE — ED Notes (Signed)
Patient arrived by EMS from AA meeting at church. Patient states she started having double vision around 11:00. Patient did not tell anyone she was having double vision and weakness. Around 12:30 members from the meeting came and got husband who was waiting for meeting to end in parking lot. Patient was feeling weak and having double vision. Patients husband then called EMS for her to be check out

## 2015-02-06 NOTE — H&P (Signed)
[facility] - [place] at [facility]   PATIENT NAME: Patricia [name]    MR#:  [id number]  DATE OF BIRTH:  1961/01/26  DATE OF ADMISSION:  02/06/2015  PRIMARY CARE PHYSICIAN: Berneice Gandy, MD   REQUESTING/REFERRING PHYSICIAN: Myrna Blazer, MD  CHIEF COMPLAINT:   Chief Complaint  Patient presents with  . Dizziness   ataxia and double vision 4 months.  HISTORY OF PRESENT ILLNESS:  Patricia [name]  is a 54 y.o. female with a known history of anxiety, depression and alcohol abuse. The patient presents to the ED with double vision and ataxia for 1 months. She also complains of full body shaking and tremor but denies any syncope, loss of consciousness or seizure. She denies any incontinence. Patient quit drinking alcohol 14 years ago but resume alcohol 2 years ago. He hasn't drank any alcohol for the past one months. She was skating and AA meeting and had some double vision, ataxia and shaking, which are worse than normal. On-call neurologist evaluated the patient and suggest admission for further workup.  PAST MEDICAL HISTORY:   Past Medical History  Diagnosis Date  . Aspiration into airway   . Depression unk  . Anxiety unk  . Alcohol abuse unk    PAST SURGICAL HISTORY:   Past Surgical History  Procedure Laterality Date  . Tracheal surgery    . Abdominal hysterectomy      SOCIAL HISTORY:   Social History  Substance Use Topics  . Smoking status: Light Tobacco Smoker    Types: Cigarettes  . Smokeless tobacco: Never Used  . Alcohol Use: No     Comment: former alcoholic "sober for a month now"    FAMILY HISTORY:   Family History  Problem Relation Age of Onset  . Breast cancer Mother   . Hypertension Mother     DRUG ALLERGIES:   Allergies  Allergen Reactions  . Penicillins Hives    REVIEW OF SYSTEMS:  CONSTITUTIONAL: No fever, fatigue or weakness. But has a dizziness and ataxia. EYES: No blurred but has double vision.  EARS, NOSE,  AND THROAT: No tinnitus or ear pain.  RESPIRATORY: No cough, shortness of breath, wheezing or hemoptysis.  CARDIOVASCULAR: No chest pain, orthopnea, edema.  GASTROINTESTINAL: No nausea, vomiting, diarrhea or abdominal pain.  GENITOURINARY: No dysuria, hematuria.  ENDOCRINE: No polyuria, nocturia,  HEMATOLOGY: No anemia, easy bruising or bleeding SKIN: No rash or lesion. MUSCULOSKELETAL: No joint pain or arthritis.   NEUROLOGIC: No tingling, numbness, weakness. No syncope, loss of consciousness or seizure or incontinence. But has body shaking and tremor. Positive for ataxia. PSYCHIATRY: No anxiety or depression.   MEDICATIONS AT HOME:   Prior to Admission medications   Medication Sig Start Date End Date Taking? Authorizing Provider  albuterol (VENTOLIN HFA) 108 (90 BASE) MCG/ACT inhaler Inhale 2 puffs into the lungs every 6 (six) hours as needed for wheezing or shortness of breath.    Yes Historical Provider, MD  ALPRAZolam (XANAX XR) 2 MG 24 hr tablet Take 1 tablet by mouth 2 (two) times daily. 01/28/15  Yes Historical Provider, MD  buPROPion (WELLBUTRIN SR) 150 MG 12 hr tablet Take 1 tablet by mouth 2 (two) times daily.   Yes Historical Provider, MD  FLUoxetine (PROZAC) 40 MG capsule Take 1 capsule by mouth daily.   Yes Historical Provider, MD  gabapentin (NEURONTIN) 300 MG capsule Take 1 capsule by mouth at bedtime.   Yes Historical Provider, MD  lamoTRIgine (LAMICTAL) 100 MG tablet Take  1 tablet by mouth every morning.   Yes Historical Provider, MD  lamoTRIgine (LAMICTAL) 200 MG tablet Take 1 tablet by mouth daily. 01/27/15  Yes Historical Provider, MD  trazodone (DESYREL) 300 MG tablet Take 1 tablet by mouth at bedtime. 08/01/14  Yes Historical Provider, MD  dicyclomine (BENTYL) 20 MG tablet Take 1 tablet (20 mg total) by mouth 3 (three) times daily as needed for spasms. 08/29/14 08/29/15  Myrna Blazer, MD  famotidine (PEPCID) 40 MG tablet Take 1 tablet (40 mg total) by mouth every  evening. 08/29/14 08/29/15  Myrna Blazer, MD  metoCLOPramide (REGLAN) 10 MG tablet Take 1 tablet (10 mg total) by mouth every 6 (six) hours as needed for nausea or vomiting. 08/29/14   Myrna Blazer, MD      VITAL SIGNS:  Blood pressure 117/95, pulse 76, temperature 98.2 F (36.8 C), temperature source Oral, resp. rate 11, height  (1.676 m), weight 54.885 kg (121 lb), SpO2 98 %.  PHYSICAL EXAMINATION:  GENERAL:  54 y.o.-year-old patient lying in the bed with no acute distress.  EYES: Pupils equal, round, reactive to light and accommodation. No scleral icterus. Extraocular muscles intact.  HEENT: Head atraumatic, normocephalic. Oropharynx and nasopharynx clear. Moist oral mucosa. NECK:  Supple, no jugular venous distention. No thyroid enlargement, no tenderness.  LUNGS: Normal breath sounds bilaterally, no wheezing, rales,rhonchi or crepitation. No use of accessory muscles of respiration.  CARDIOVASCULAR: S1, S2 normal. No murmurs, rubs, or gallops.  ABDOMEN: Soft, nontender, nondistended. Bowel sounds present. No organomegaly or mass.  EXTREMITIES: No pedal edema, cyanosis, or clubbing.  NEUROLOGIC: Cranial nerves II through XII are intact. Muscle strength 4/5 in all extremities. Sensation intact. Gait not checked. Hyperactive DTR. PSYCHIATRIC: The patient is alert and oriented x 3.  SKIN: No obvious rash, lesion, or ulcer.   LABORATORY PANEL:   CBC  Recent Labs Lab 02/06/15 1353  WBC 4.8  HGB 14.1  HCT 43.0  PLT 201   ------------------------------------------------------------------------------------------------------------------  Chemistries   Recent Labs Lab 02/06/15 1353  NA 136  K 4.4  CL 100*  CO2 30  GLUCOSE 84  BUN 8  CREATININE 0.85  CALCIUM 9.6  AST 34  ALT 30  ALKPHOS 63  BILITOT 0.8   ------------------------------------------------------------------------------------------------------------------  Cardiac Enzymes  Recent  Labs Lab 02/06/15 1353  TROPONINI <0.03   ------------------------------------------------------------------------------------------------------------------  RADIOLOGY:  Ct Head Wo Contrast  02/06/2015  CLINICAL DATA:  Double vision.  Weakness. EXAM: CT HEAD WITHOUT CONTRAST TECHNIQUE: Contiguous axial images were obtained from the base of the skull through the vertex without intravenous contrast. COMPARISON:  07/30/2013. FINDINGS: No acute intracranial hemorrhage. No focal mass lesion. No CT evidence of acute infarction. No midline shift or mass effect. No hydrocephalus. Basilar cisterns are patent. Paranasal sinuses and  mastoid air cells are clear. IMPRESSION: Normal head CT. Electronically Signed   By: Genevive Bi M.D.   On: 02/06/2015 14:02    EKG:   Orders placed or performed during the hospital encounter of 02/06/15  . EKG 12-Lead  . EKG 12-Lead  . ED EKG  . ED EKG    IMPRESSION AND PLAN:   Ataxia and double vision. Per neurology consult, he doesn't think patient has a stroke, could be chronic and alcohol effects. but needs further workup. The patient will get MRI of the brain and cervical spine and echocardiograph. Start aspirin and Plavix, follow-up lipid panel and hemoglobin A1c. Cardiology consult for considering TEE with bubble study. Nothing by  mouth except medication, speech study. Fall and aspiration precaution. Neuro check.  Alcohol abuse. CIWA protocol. Not drinking alcohol for for 1 month. Depression and anxiety. Continue home medication.   All the records are reviewed and case discussed with ED provider. Management plans discussed with the patient, her husband and they are in agreement.  CODE STATUS: Full code  TOTAL TIME TAKING CARE OF THIS PATIENT: 55 minutes.    Shaune Pollackhen, Vannary Greening M.D on 02/06/2015 at 3:57 PM  Between 7am to 6pm - Pager - [phone]  After 6pm go to [email or web address] - password [email or web address] [facility]  [facility]  Office   [phone]  CC: Primary care physician; Berneice GandyFOWLER,VICKI S, MD

## 2015-02-06 NOTE — Progress Notes (Signed)
Code Stroke Page-- Patient taken for CT scan.  Offered support to spouse. Chaplain Deeann CreeDonna S. Tasia Catchingsraig Ext [phone]

## 2015-02-07 ENCOUNTER — Observation Stay: Payer: No Typology Code available for payment source

## 2015-02-07 ENCOUNTER — Observation Stay
Admit: 2015-02-07 | Discharge: 2015-02-07 | Disposition: A | Discharge status: Home / Self Care | Payer: No Typology Code available for payment source | Attending: Internal Medicine | Admitting: Internal Medicine

## 2015-02-07 LAB — LIPID PANEL
CHOLESTEROL: 232 mg/dL — AB (ref 0–200)
HDL: 55 mg/dL (ref >40)
LDL CALC: 161 mg/dL — AB (ref 0–99)
Total CHOL/HDL Ratio: 4.2 RATIO
Triglycerides: 82 mg/dL (ref <150)
VLDL: 16 mg/dL (ref 0–40)

## 2015-02-07 LAB — HEMOGLOBIN A1C: Hgb A1c MFr Bld: 4.9 % (ref 4.0–6.0)

## 2015-02-07 MED ORDER — FOLIC ACID 1 MG PO TABS
1.0000 mg | ORAL_TABLET | Freq: Every day | ORAL | Status: DC
  Administered 2015-02-07 – 2015-02-08 (×2): 1 mg via ORAL
  Filled 2015-02-07: qty 1

## 2015-02-07 MED ORDER — VITAMIN B-1 100 MG PO TABS
500.0000 mg | ORAL_TABLET | Freq: Every day | ORAL | Status: DC
  Administered 2015-02-07: 500 mg via ORAL
  Filled 2015-02-07 (×3): qty 5

## 2015-02-07 MED ORDER — ATORVASTATIN CALCIUM 20 MG PO TABS
40.0000 mg | ORAL_TABLET | Freq: Every day | ORAL | Status: DC
  Administered 2015-02-07: 40 mg via ORAL
  Filled 2015-02-07: qty 2

## 2015-02-07 NOTE — Progress Notes (Signed)
Off unit for MRI

## 2015-02-07 NOTE — Progress Notes (Signed)
[facility] Cardiology  CARDIOLOGY CONSULT NOTE  Patient ID: Patricia [name] MRN: [id number] DOB/AGE: 1961-02-16 54 y.o.  Admit date: 02/06/2015 Referring Physician Elisabeth Pigeon Primary Physician [facility] Primary Cardiologist Reason for Consultation dizziness  HPI: 54 year old female referred for evaluation of dizziness, ataxia and double vision. Patient has known history of alcohol abuse. The patient presents with double vision, ataxia and shaking. The patient has had a brief neurological evaluation just in that the patient likely did not have a stroke symptoms are likely due to chronic alcohol effects. The patient denies chest pain shortness of breath. She is not expressing palpitations or heart racing. Mild peripheral edema. Initial labs were notable for normal troponin.  Review of systems complete and found to be negative unless listed above     Past Medical History  Diagnosis Date  . Aspiration into airway   . Depression unk  . Anxiety unk  . Alcohol abuse unk    Past Surgical History  Procedure Laterality Date  . Tracheal surgery    . Abdominal hysterectomy      Prescriptions prior to admission  Medication Sig Dispense Refill Last Dose  . albuterol (VENTOLIN HFA) 108 (90 BASE) MCG/ACT inhaler Inhale 2 puffs into the lungs every 6 (six) hours as needed for wheezing or shortness of breath.    prn at prn  . ALPRAZolam (XANAX XR) 2 MG 24 hr tablet Take 1 tablet by mouth 2 (two) times daily.   02/06/2015 at Unknown time  . buPROPion (WELLBUTRIN SR) 150 MG 12 hr tablet Take 1 tablet by mouth 2 (two) times daily.   02/06/2015 at Unknown time  . FLUoxetine (PROZAC) 40 MG capsule Take 1 capsule by mouth daily.   02/06/2015 at Unknown time  . trazodone (DESYREL) 300 MG tablet Take 1 tablet by mouth at bedtime.   02/05/2015 at Unknown time   Social History   Social History  . Marital Status: Married    Spouse Name: N/A  . Number of Children: N/A  . Years of Education: N/A   Occupational History   . Not on file.   Social History Main Topics  . Smoking status: Light Tobacco Smoker    Types: Cigarettes  . Smokeless tobacco: Never Used  . Alcohol Use: No     Comment: former alcoholic "sober for a month now"  . Drug Use: No  . Sexual Activity: Not on file   Other Topics Concern  . Not on file   Social History Narrative    Family History  Problem Relation Age of Onset  . Breast cancer Mother   . Hypertension Mother       Review of systems complete and found to be negative unless listed above      PHYSICAL EXAM  General: Well developed, well nourished, in no acute distress HEENT:  Normocephalic and atramatic Neck:  No JVD.  Lungs: Clear bilaterally to auscultation and percussion. Heart: HRRR . Normal S1 and S2 without gallops or murmurs.  Abdomen: Bowel sounds are positive, abdomen soft and non-tender  Msk:  Back normal, normal gait. Normal strength and tone for age. Extremities: No clubbing, cyanosis or edema.   Neuro: Alert and oriented X 3. Psych:  Good affect, responds appropriately  Labs:   Lab Results  Component Value Date   WBC 4.8 02/06/2015   HGB 14.1 02/06/2015   HCT 43.0 02/06/2015   MCV 95.3 02/06/2015   PLT 201 02/06/2015    Recent Labs Lab 02/06/15 1353  NA 136  K 4.4  CL 100*  CO2 30  BUN 8  CREATININE 0.85  CALCIUM 9.6  PROT 7.6  BILITOT 0.8  ALKPHOS 63  ALT 30  AST 34  GLUCOSE 84   Lab Results  Component Value Date   CKMB 1.1 05/23/2013   TROPONINI <0.03 02/06/2015    Lab Results  Component Value Date   CHOL 232* 02/07/2015   Lab Results  Component Value Date   HDL 55 02/07/2015   Lab Results  Component Value Date   LDLCALC 161* 02/07/2015   Lab Results  Component Value Date   TRIG 82 02/07/2015   TRIG 197 08/16/2013   TRIG 224* 08/10/2013   Lab Results  Component Value Date   CHOLHDL 4.2 02/07/2015   No results found for: LDLDIRECT    Radiology: Ct Head Wo Contrast  02/06/2015  CLINICAL DATA:   Double vision.  Weakness. EXAM: CT HEAD WITHOUT CONTRAST TECHNIQUE: Contiguous axial images were obtained from the base of the skull through the vertex without intravenous contrast. COMPARISON:  07/30/2013. FINDINGS: No acute intracranial hemorrhage. No focal mass lesion. No CT evidence of acute infarction. No midline shift or mass effect. No hydrocephalus. Basilar cisterns are patent. Paranasal sinuses and  mastoid air cells are clear. IMPRESSION: Normal head CT. Electronically Signed   By: Genevive BiStewart  Edmunds M.D.   On: 02/06/2015 14:02    EKG: Normal sinus rhythm  ASSESSMENT AND PLAN:   1. Ataxia, double vision, tremors, likely due to all abuse, unlikely due to stroke. Surface 2-D echocardiogram and MRI of the brain pending. No current indication for transesophageal echocardiogram.  Recommendations  1. Agree with current therapy 2. Review 2-D echocardiogram 3. Further recommendations pending echocardiogram results   Signed: Lasean Gorniak MD,PhD, [facility] 02/07/2015, 9:53 AM

## 2015-02-07 NOTE — Progress Notes (Signed)
[facility]   PATIENT NAME: Patricia FillersSusy [name]    MR#:  [id number]  DATE OF BIRTH:  10/07/1960  SUBJECTIVE:  CHIEF COMPLAINT:   Chief Complaint  Patient presents with  . Dizziness   ataxia, imbalance while walking, double vision for last 4-5 months. Patient does not have any complaints husband present in the room and he came in all these details, patient agrees with his reports, but this is going on chronically for last few months.  REVIEW OF SYSTEMS:  CONSTITUTIONAL: No fever, fatigue or weakness. gross imbalance.  EYES: Positive for blurred or double vision.  EARS, NOSE, AND THROAT: No tinnitus or ear pain.  RESPIRATORY: No cough, shortness of breath, wheezing or hemoptysis.  CARDIOVASCULAR: No chest pain, orthopnea, edema.  GASTROINTESTINAL: No nausea, vomiting, diarrhea or abdominal pain.  GENITOURINARY: No dysuria, hematuria.  ENDOCRINE: No polyuria, nocturia,  HEMATOLOGY: No anemia, easy bruising or bleeding SKIN: No rash or lesion. MUSCULOSKELETAL: No joint pain or arthritis.   NEUROLOGIC: No tingling, numbness, weakness. Imbalance and shaking. PSYCHIATRY: No anxiety or depression.   ROS  DRUG ALLERGIES:   Allergies  Allergen Reactions  . Penicillins Hives    VITALS:  Blood pressure 112/65, pulse 60, temperature 98.1 F (36.7 C), temperature source Oral, resp. rate 18, height 5\' 6"  (1.676 m), weight 54.885 kg (121 lb), SpO2 98 %.  PHYSICAL EXAMINATION:  GENERAL:  54 y.o.-year-old patient lying in the bed with no acute distress.  EYES: Pupils equal, round, reactive to light and accommodation. No scleral icterus. Extraocular muscles intact.  HEENT: Head atraumatic, normocephalic. Oropharynx and nasopharynx clear.  NECK:  Supple, no jugular venous distention. No thyroid enlargement, no tenderness.  LUNGS: Normal breath sounds bilaterally, no wheezing, rales,rhonchi or crepitation. No use of accessory muscles of respiration.   CARDIOVASCULAR: S1, S2 normal. No murmurs, rubs, or gallops.  ABDOMEN: Soft, nontender, nondistended. Bowel sounds present. No organomegaly or mass.  EXTREMITIES: No pedal edema, cyanosis, or clubbing.  NEUROLOGIC: Cranial nerves II through XII are intact. Muscle strength 4/5 in all extremities. Sensation intact. Gait not checked. gross shaking movements on raising her both arms, with finger nose test is significantly abnormal. PSYCHIATRIC: The patient is alert and oriented x 3.  SKIN: No obvious rash, lesion, or ulcer.   Physical Exam LABORATORY PANEL:   CBC  Recent Labs Lab 02/06/15 1353  WBC 4.8  HGB 14.1  HCT 43.0  PLT 201   ------------------------------------------------------------------------------------------------------------------  Chemistries   Recent Labs Lab 02/06/15 1353 02/06/15 1820  NA 136  --   K 4.4  --   CL 100*  --   CO2 30  --   GLUCOSE 84  --   BUN 8  --   CREATININE 0.85  --   CALCIUM 9.6  --   MG  --  2.1  AST 34  --   ALT 30  --   ALKPHOS 63  --   BILITOT 0.8  --    ------------------------------------------------------------------------------------------------------------------  Cardiac Enzymes  Recent Labs Lab 02/06/15 1353  TROPONINI <0.03   ------------------------------------------------------------------------------------------------------------------  RADIOLOGY:  Ct Head Wo Contrast  02/06/2015  CLINICAL DATA:  Double vision.  Weakness. EXAM: CT HEAD WITHOUT CONTRAST TECHNIQUE: Contiguous axial images were obtained from the base of the skull through the vertex without intravenous contrast. COMPARISON:  07/30/2013. FINDINGS: No acute intracranial hemorrhage. No focal mass lesion. No CT evidence of acute infarction. No midline shift or mass effect. No hydrocephalus. Basilar cisterns  are patent. Paranasal sinuses and  mastoid air cells are clear. IMPRESSION: Normal head CT. Electronically Signed   By: Genevive Bi M.D.   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  LDL is high. I will start statin.  Need dietary supplement and completely stopped the alcohol.  * Hyperlipidemia  Atorvastatin started.  * Depression  Taking Prozac, continue that.   All the records are reviewed and case discussed with Care Management/Social Workerr. Management plans discussed with the patient, family and they are in agreement.  CODE STATUS: Full code  TOTAL TIME TAKING CARE OF THIS PATIENT: 35 minutes.   Discussed with patient's husband present in the room.  POSSIBLE D/C IN 1-2DAYS, DEPENDING ON CLINICAL CONDITION.   Altamese Dilling M.D on 02/07/2015   Between 7am to 6pm - Pager - [phone]  After 6pm go to [email or web address] - password [email or web address]  [facility]  Office  [phone]  CC: Primary care physician; Berneice Gandy, MD  Note: This dictation was prepared with Dragon dictation along with smaller phrase technology. Any transcriptional errors that result from this process are unintentional.

## 2015-02-07 NOTE — Progress Notes (Signed)
MD notified of adjustment needed for med according to med list brought by spouse.

## 2015-02-07 NOTE — Care Management Note (Signed)
Case Management Note  Patient Details  Name: Allena EaringSusy Ann Kistler MRN: [id number] Date of Birth: 08/25/1960  Subjective/Objective:     [facility] Social Work per relapse on ETOH.                Action/Plan:   Expected Discharge Date:  02/09/15               Expected Discharge Plan:     In-House Referral:     Discharge planning Services     Post Acute Care Choice:    Choice offered to:     DME Arranged:    DME Agency:     HH Arranged:    HH Agency:     Status of Service:     Medicare Important Message Given:    Date Medicare IM Given:    Medicare IM give by:    Date Additional Medicare IM Given:    Additional Medicare Important Message give by:     If discussed at Long Length of Stay Meetings, dates discussed:    Additional Comments:  Yumi Insalaco A, RN 02/07/2015, 2:30 PM

## 2015-02-07 NOTE — Evaluation (Signed)
Clinical/Bedside Swallow Evaluation Patient Details  Name: Patricia [name] MRN: [id number] Date of Birth: Nov 30, 1960  Today's Date: 02/07/2015 Time: SLP Start Time (ACUTE ONLY): 0955 SLP Stop Time (ACUTE ONLY): 1045 SLP Time Calculation (min) (ACUTE ONLY): 50 min  Past Medical History:  Past Medical History  Diagnosis Date  . Aspiration into airway   . Depression unk  . Anxiety unk  . Alcohol abuse unk   Past Surgical History:  Past Surgical History  Procedure Laterality Date  . Tracheal surgery    . Abdominal hysterectomy     HPI:  Pt is a 54 year old female referred for evaluation of dizziness, ataxia and double vision. Patient has known history of alcohol abuse. The patient presents with double vision, ataxia and shaking. The patient has had a brief neurological evaluation just in that the patient likely did not have a stroke symptoms are likely due to chronic alcohol effects. Pt has a h/o hospitalization ~1.5 yrs ago w/ lengthy oral intubation then tracheostomy sec. to pt pulling out the ET tube. Pt also had an NG tube at the time. Pt was decannulated soon after but reported issues w/ her trachea describing possible scar tissue and tx of it. Pt also has problems frequently w/ her anxiety and takes Xanax. She often feels her pills and some foods "don't go down well" and felt that a pill she took in the ED while anxious yesterday did not go down fully - this was her complaint to the MD in the ED. Pt usually eats a regular diet which her husband cuts well for her sec. to her difficulty w/ meats; she stated she eats small bites. She stated she can "usually" take her pills w/ water "fine". Husband agreed. Noted UE ataxia during self feeding; overall weakness in her movements and effort of speech/communication. She was A/O x3.    Assessment / Plan / Recommendation Clinical Impression  Pt appeared to adequately tolerate trials of po's w/ no overt s/s of aspiration noted; no oral phase deficits  noted. Pt did not c/o any pharyngeal or esophageal deficits when taking po trials; trials appeared to clear adequately. Pt recent took a handful of pills and swallowed/cleared w/out complaint of difficulty. Pt appears at reduced risk for aspiration following general aspiration precautions w/ eating/drinking. Discussed general aspiration precautions and rec'd f/u w/ GI if any further feelings of pills getting "stuck" and not passing appropriately. Suspect pt's baseline Anxiety may be impacting pt's swallowing when in the moment of an anxiety episode - tension and increased respiratory status could impact swallowing(this was discussed w/ pt/husband). NSG updated.     Aspiration Risk   (reduced)    Diet Recommendation  mech soft/regular diet w/ thin liquids; meats/foods cut small and moist; general aspiration precautions  Medication Administration: Whole meds with liquid (but educated on using applesauce/yogurt as needed)    Other  Recommendations Recommended Consults: Consider GI evaluation Oral Care Recommendations: Oral care BID;Patient independent with oral care   Follow up Recommendations  None (at this time)    Frequency and Duration            Prognosis Prognosis for Safe Diet Advancement: Good Barriers/Prognosis Comment: barrier includes past tracheal surgery, baseline anxiety requiring meds, neurological deficits possibly d/t past ETOH abuse per chart.      Swallow Study   General Date of Onset: 02/06/15 HPI: Pt is a 54 year old female referred for evaluation of dizziness, ataxia and double vision. Patient has known history of alcohol abuse. The  patient presents with double vision, ataxia and shaking. The patient has had a brief neurological evaluation just in that the patient likely did not have a stroke symptoms are likely due to chronic alcohol effects. Pt has a h/o hospitalization ~1.5 yrs ago w/ lengthy oral intubation then tracheostomy sec. to pt pulling out the ET tube. Pt also  had an NG tube at the time. Pt was decannulated soon after but reported issues w/ her trachea describing possible scar tissue and tx of it. Pt also has problems frequently w/ her anxiety and takes Xanax. She often feels her pills and some foods "don't go down well" and felt that a pill she took in the ED while anxious yesterday did not go down fully - this was her complaint to the MD in the ED. Pt usually eats a regular diet which her husband cuts well for her sec. to her difficulty w/ meats; she stated she eats small bites. She stated she can "usually" take her pills w/ water "fine". Husband agreed. Noted UE ataxia during self feeding; overall weakness in her movements and effort of speech/communication. She was A/O x3.  Type of Study: Bedside Swallow Evaluation Previous Swallow Assessment: when hospitalized ~1.5 yrs ago Diet Prior to this Study: Regular;Thin liquids (soft foods cut small) Temperature Spikes Noted: No (wbc 4.8) Respiratory Status: Room air History of Recent Intubation: No Behavior/Cognition: Alert;Cooperative;Pleasant mood Oral Cavity Assessment: Within Functional Limits Oral Care Completed by SLP: No Oral Cavity - Dentition: Adequate natural dentition Vision: Functional for self-feeding Self-Feeding Abilities: Able to feed self;Needs set up;Needs assist (cutting meats) Patient Positioning: Upright in bed Baseline Vocal Quality: Low vocal intensity Volitional Swallow: Able to elicit    Oral/Motor/Sensory Function Overall Oral Motor/Sensory Function: Within functional limits (w/ po trials)   Ice Chips Ice chips: Not tested   Thin Liquid Thin Liquid: Within functional limits Presentation: Cup;Self Fed (~2 ozs total)    Nectar Thick Nectar Thick Liquid: Not tested   Honey Thick Honey Thick Liquid: Not tested   Puree Puree: Within functional limits Presentation: Self Fed;Spoon (3 trials)   Solid Solid: Not tested Other Comments: pt had just finished eating a regular  diet/breakfast       Jerilynn SomKatherine Watson, MS, CCC-SLP  Watson,Katherine 02/07/2015,10:57 AM

## 2015-02-07 NOTE — Consult Note (Signed)
Reason for Consult: ataxia Referring Physician: Dr. Wille Glaser Patricia [name] is an 54 y.o. female.  HPI:  54 yo RHD F presents to [facility] due to ataxia.  Pt does not know how long this has been occuring but husband states that it has been months.  There is a mild complaint of vertigo too.  No weakness, numbness, vision changes, headache or speech problems noted.  Pt has recently relapsed and started to drink since they moved to [place] from their home in [place].  Past Medical History  Diagnosis Date  . Aspiration into airway   . Depression unk  . Anxiety unk  . Alcohol abuse unk    Past Surgical History  Procedure Laterality Date  . Tracheal surgery    . Abdominal hysterectomy      Family History  Problem Relation Age of Onset  . Breast cancer Mother   . Hypertension Mother     Social History:  reports that she has been smoking Cigarettes.  She has never used smokeless tobacco. She reports that she does not drink alcohol or use illicit drugs.  Pt has started to drink again over the past few months where she drinks a pint of vodka  Allergies:  Allergies  Allergen Reactions  . Penicillins Hives    Medications: personally reviewed by me  Results for orders placed or performed during the hospital encounter of 02/06/15 (from the past 48 hour(s))  Ethanol     Status: Abnormal   Collection Time: 02/06/15  1:53 PM  Result Value Ref Range   Alcohol, Ethyl (B) 12 (H) <5 mg/dL    Comment:        LOWEST DETECTABLE LIMIT FOR SERUM ALCOHOL IS 5 mg/dL FOR MEDICAL PURPOSES ONLY   Protime-INR     Status: None   Collection Time: 02/06/15  1:53 PM  Result Value Ref Range   Prothrombin Time 13.3 11.4 - 15.0 seconds   INR 0.99   APTT     Status: Abnormal   Collection Time: 02/06/15  1:53 PM  Result Value Ref Range   aPTT 41 (H) 24 - 36 seconds    Comment:        IF BASELINE aPTT IS ELEVATED, SUGGEST PATIENT RISK ASSESSMENT BE USED TO DETERMINE APPROPRIATE ANTICOAGULANT THERAPY.   CBC      Status: None   Collection Time: 02/06/15  1:53 PM  Result Value Ref Range   WBC 4.8 3.6 - 11.0 K/uL   RBC 4.52 3.80 - 5.20 MIL/uL   Hemoglobin 14.1 12.0 - 16.0 g/dL   HCT 43.0 35.0 - 47.0 %   MCV 95.3 80.0 - 100.0 fL   MCH 31.3 26.0 - 34.0 pg   MCHC 32.8 32.0 - 36.0 g/dL   RDW 13.8 11.5 - 14.5 %   Platelets 201 150 - 440 K/uL  Differential     Status: None   Collection Time: 02/06/15  1:53 PM  Result Value Ref Range   Neutrophils Relative % 62 %   Neutro Abs 3.0 1.4 - 6.5 K/uL   Lymphocytes Relative 30 %   Lymphs Abs 1.5 1.0 - 3.6 K/uL   Monocytes Relative 6 %   Monocytes Absolute 0.3 0.2 - 0.9 K/uL   Eosinophils Relative 1 %   Eosinophils Absolute 0.0 0 - 0.7 K/uL   Basophils Relative 1 %   Basophils Absolute 0.0 0 - 0.1 K/uL  Comprehensive metabolic panel     Status: Abnormal   Collection Time: 02/06/15  1:53 PM  Result Value Ref Range   Sodium 136 135 - 145 mmol/L   Potassium 4.4 3.5 - 5.1 mmol/L   Chloride 100 (L) 101 - 111 mmol/L   CO2 30 22 - 32 mmol/L   Glucose, Bld 84 65 - 99 mg/dL   BUN 8 6 - 20 mg/dL   Creatinine, Ser 0.85 0.44 - 1.00 mg/dL   Calcium 9.6 8.9 - 10.3 mg/dL   Total Protein 7.6 6.5 - 8.1 g/dL   Albumin 4.6 3.5 - 5.0 g/dL   AST 34 15 - 41 U/L   ALT 30 14 - 54 U/L   Alkaline Phosphatase 63 38 - 126 U/L   Total Bilirubin 0.8 0.3 - 1.2 mg/dL   GFR calc non Af Amer >60 >60 mL/min   GFR calc Af Amer >60 >60 mL/min    Comment: (NOTE) The eGFR has been calculated using the CKD EPI equation. This calculation has not been validated in all clinical situations. eGFR's persistently <60 mL/min signify possible Chronic Kidney Disease.    Anion gap 6 5 - 15  Troponin I     Status: None   Collection Time: 02/06/15  1:53 PM  Result Value Ref Range   Troponin I <0.03 <0.031 ng/mL    Comment:        NO INDICATION OF MYOCARDIAL INJURY.   Urine Drug Screen, Qualitative ([facility] only)     Status: Abnormal   Collection Time: 02/06/15  1:53 PM  Result Value Ref  Range   Tricyclic, Ur Screen NONE DETECTED NONE DETECTED   Amphetamines, Ur Screen NONE DETECTED NONE DETECTED   MDMA (Ecstasy)Ur Screen NONE DETECTED NONE DETECTED   Cocaine Metabolite,Ur [place] NONE DETECTED NONE DETECTED   Opiate, Ur Screen NONE DETECTED NONE DETECTED   Phencyclidine (PCP) Ur S NONE DETECTED NONE DETECTED   Cannabinoid 50 Ng, Ur [place] NONE DETECTED NONE DETECTED   Barbiturates, Ur Screen NONE DETECTED NONE DETECTED   Benzodiazepine, Ur Scrn POSITIVE (A) NONE DETECTED   Methadone Scn, Ur NONE DETECTED NONE DETECTED    Comment: (NOTE) 102  Tricyclics, urine               Cutoff 1000 ng/mL 200  Amphetamines, urine             Cutoff 1000 ng/mL 300  MDMA (Ecstasy), urine           Cutoff 500 ng/mL 400  Cocaine Metabolite, urine       Cutoff 300 ng/mL 500  Opiate, urine                   Cutoff 300 ng/mL 600  Phencyclidine (PCP), urine      Cutoff 25 ng/mL 700  Cannabinoid, urine              Cutoff 50 ng/mL 800  Barbiturates, urine             Cutoff 200 ng/mL 900  Benzodiazepine, urine           Cutoff 200 ng/mL 1000 Methadone, urine                Cutoff 300 ng/mL 1100 1200 The urine drug screen provides only a preliminary, unconfirmed 1300 analytical test result and should not be used for non-medical 1400 purposes. Clinical consideration and professional judgment should 1500 be applied to any positive drug screen result due to possible 1600 interfering substances. A more specific alternate chemical method 1700 must be used  in order to obtain a confirmed analytical result.  1800 Gas chromato graphy / mass spectrometry (GC/MS) is the preferred 1900 confirmatory method.   Urinalysis complete, with microscopic ([facility] only)     Status: Abnormal   Collection Time: 02/06/15  1:53 PM  Result Value Ref Range   Color, Urine YELLOW (A) YELLOW   APPearance CLEAR (A) CLEAR   Glucose, UA NEGATIVE NEGATIVE mg/dL   Bilirubin Urine NEGATIVE NEGATIVE   Ketones, ur NEGATIVE NEGATIVE  mg/dL   Specific Gravity, Urine 1.013 1.005 - 1.030   Hgb urine dipstick NEGATIVE NEGATIVE   pH 7.0 5.0 - 8.0   Protein, ur NEGATIVE NEGATIVE mg/dL   Nitrite NEGATIVE NEGATIVE   Leukocytes, UA 2+ (A) NEGATIVE   RBC / HPF 0-5 0 - 5 RBC/hpf   WBC, UA 0-5 0 - 5 WBC/hpf   Bacteria, UA NONE SEEN NONE SEEN   Squamous Epithelial / LPF 0-5 (A) NONE SEEN   Mucous PRESENT   Magnesium     Status: None   Collection Time: 02/06/15  6:20 PM  Result Value Ref Range   Magnesium 2.1 1.7 - 2.4 mg/dL  Lipid panel     Status: Abnormal   Collection Time: 02/07/15  3:39 AM  Result Value Ref Range   Cholesterol 232 (H) 0 - 200 mg/dL   Triglycerides 82 <150 mg/dL   HDL 55 >40 mg/dL   Total CHOL/HDL Ratio 4.2 RATIO   VLDL 16 0 - 40 mg/dL   LDL Cholesterol 161 (H) 0 - 99 mg/dL    Comment:        Total Cholesterol/HDL:CHD Risk Coronary Heart Disease Risk Table                     Men   Women  1/2 Average Risk   3.4   3.3  Average Risk       5.0   4.4  2 X Average Risk   9.6   7.1  3 X Average Risk  23.4   11.0        Use the calculated Patient Ratio above and the CHD Risk Table to determine the patient's CHD Risk.        ATP III CLASSIFICATION (LDL):  <100     mg/dL   Optimal  100-129  mg/dL   Near or Above                    Optimal  130-159  mg/dL   Borderline  160-189  mg/dL   High  >190     mg/dL   Very High     Ct Head Wo Contrast  02/06/2015  CLINICAL DATA:  Double vision.  Weakness. EXAM: CT HEAD WITHOUT CONTRAST TECHNIQUE: Contiguous axial images were obtained from the base of the skull through the vertex without intravenous contrast. COMPARISON:  07/30/2013. FINDINGS: No acute intracranial hemorrhage. No focal mass lesion. No CT evidence of acute infarction. No midline shift or mass effect. No hydrocephalus. Basilar cisterns are patent. Paranasal sinuses and  mastoid air cells are clear. IMPRESSION: Normal head CT. Electronically Signed   By: Suzy Bouchard M.D.   On: 02/06/2015  14:02    Review of Systems  Constitutional: Negative.   HENT: Positive for hearing loss. Negative for congestion, ear discharge, ear pain, nosebleeds, sore throat and tinnitus.   Eyes: Negative.   Respiratory: Negative.  Negative for stridor.   Cardiovascular: Negative.   Gastrointestinal: Positive for nausea.  Negative for heartburn, vomiting, abdominal pain, diarrhea, constipation, blood in stool and melena.  Genitourinary: Negative.   Musculoskeletal: Negative.   Skin: Negative.   Neurological: Positive for dizziness. Negative for tingling, tremors, sensory change, speech change, focal weakness, seizures, loss of consciousness and headaches.  Psychiatric/Behavioral: Positive for depression.   Blood pressure 100/61, pulse 70, temperature 98.7 F (37.1 C), temperature source Oral, resp. rate 18, height 5' 6"  (1.676 m), weight 54.885 kg (121 lb), SpO2 97 %. Physical Exam  Nursing note and vitals reviewed. Constitutional: She appears well-developed and well-nourished. No distress.  HENT:  Head: Normocephalic and atraumatic.  Right Ear: External ear normal.  Left Ear: External ear normal.  Nose: Nose normal.  Mouth/Throat: Oropharynx is clear and moist.  Eyes: Conjunctivae and EOM are normal. Pupils are equal, round, and reactive to light. No scleral icterus.  Neck: Normal range of motion. Neck supple.  Cardiovascular: Normal rate, regular rhythm, normal heart sounds and intact distal pulses.   No murmur heard. Respiratory: Effort normal and breath sounds normal. No respiratory distress.  GI: Soft. Bowel sounds are normal. She exhibits no distension.  Musculoskeletal: Normal range of motion. She exhibits no edema or tenderness.  Neurological:  A+Ox4, nl speech and language PERRLA, EOMI, nl VF, face symmetric, tongue midline 5-/5 B, + high frequency tremor B, nl tone, give away weakness FTN and HTS WNL 2+/4 B, mute plantars B Intact vibration and pinprick  Skin: Skin is warm. She  is not diaphoretic.  Psychiatric: Her speech is delayed. She is withdrawn. She exhibits a depressed mood.   MRI of brain personally reviewed by me and normal  Assessment/Plan: 1.  Ataxia-  Likely due to EtOH abuse, neurologic exam is benign and pt has EtOH in system when she arrived 2.  Depression-  Likely leading to EtOH abuse -  Increase thiamine to 337m IV x 3 days -  Check B12 and replace if less than 300 -  Place on CIWA protocol as she appears that she could go into withdrawal -  Would benefit from psychiatric consult -  PRN meclizine for dizziness -  Needs therapy consult -  Will sign off, please call with questions  STamala Julian Kariyah Baugh 02/07/2015, 12:56 PM

## 2015-02-07 NOTE — Progress Notes (Signed)
12noon pt transported to MRI for scans unable to do CIWA evaluation

## 2015-02-07 NOTE — Progress Notes (Signed)
Pt had a restful day no seizures , pt ambulated to bathroom , tolerated diet , Dr Katrinka BlazingSmith neuro rounded , Dr Elisabeth PigeonVachhani rounded

## 2015-02-08 LAB — VITAMIN B12: VITAMIN B 12: 222 pg/mL (ref 180–914)

## 2015-02-08 MED ORDER — LAMOTRIGINE 200 MG PO TABS: 200.0000 mg | ORAL_TABLET | Freq: Every day | ORAL | Status: DC

## 2015-02-08 MED ORDER — ADULT MULTIVITAMIN W/MINERALS CH: 1.0000 | ORAL_TABLET | Freq: Every day | ORAL | Status: DC

## 2015-02-08 MED ORDER — ATORVASTATIN CALCIUM 40 MG PO TABS: 40.0000 mg | ORAL_TABLET | Freq: Every day | ORAL | Status: DC

## 2015-02-08 MED ORDER — VITAMIN B-1 100 MG PO TABS: 100.0000 mg | ORAL_TABLET | Freq: Every day | ORAL | Status: DC

## 2015-02-08 MED ORDER — FOLIC ACID 1 MG PO TABS: 1.0000 mg | ORAL_TABLET | Freq: Every day | ORAL | Status: DC

## 2015-02-08 MED ORDER — THIAMINE HCL 100 MG/ML IJ SOLN: 300.0000 mg | INTRAMUSCULAR | Status: DC

## 2015-02-08 NOTE — Discharge Summary (Signed)
[facility] - [place] at [facility]   PATIENT NAME: Patricia FillersSusy [name]    Patricia#:  [id number]  DATE OF BIRTH:  08/17/1960  DATE OF ADMISSION:  02/06/2015 ADMITTING PHYSICIAN: Shaune PollackQing Chen, MD  DATE OF DISCHARGE: 02/08/2015  PRIMARY CARE PHYSICIAN: Berneice GandyFOWLER,VICKI S, MD    ADMISSION DIAGNOSIS:  Diplopia [H53.2] Ataxic gait [R26.0]  DISCHARGE DIAGNOSIS:  Principal Problem:   Ataxia   SECONDARY DIAGNOSIS:   Past Medical History  Diagnosis Date  . Aspiration into airway   . Depression unk  . Anxiety unk  . 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 LDL is high. start statin. Need dietary supplement and completely stopped the alcohol.   councelled about that.   Advised to follow with neurology clinic.    She have proper vision now, and feels much better in her balance in walking now.    * Hyperlipidemia Atorvastatin started.  * Depression Taking Prozac, continue that.   DISCHARGE CONDITIONS:  Stable.  CONSULTS OBTAINED:  Treatment Team:  Marcina MillardAlexander Paraschos, MD Mellody DrownMatthew Smith, MD  DRUG ALLERGIES:   Allergies  Allergen Reactions  . Penicillins Hives    DISCHARGE MEDICATIONS:   Current Discharge Medication List    START taking these medications   Details  atorvastatin (LIPITOR) 40 MG tablet Take 1 tablet (40 mg total) by mouth daily at 6 PM. Qty: 30 tablet, Refills: 0    folic acid (FOLVITE) 1 MG tablet Take 1 tablet (1 mg total) by mouth daily. Qty: 30 tablet, Refills: 0    Multiple Vitamin (MULTIVITAMIN WITH MINERALS) TABS tablet Take 1 tablet by mouth daily. Qty: 30 tablet, Refills: 0    thiamine (VITAMIN B-1) 100 MG tablet Take 1 tablet (100 mg total) by mouth daily. Qty: 30 tablet, Refills: 0      CONTINUE these medications which have CHANGED   Details  lamoTRIgine (LAMICTAL) 200 MG tablet Take 1  tablet (200 mg total) by mouth daily. Qty: 30 tablet, Refills: 0      CONTINUE these medications which have NOT CHANGED   Details  albuterol (VENTOLIN HFA) 108 (90 BASE) MCG/ACT inhaler Inhale 2 puffs into the lungs every 6 (six) hours as needed for wheezing or shortness of breath.     ALPRAZolam (XANAX XR) 2 MG 24 hr tablet Take 1 tablet by mouth 2 (two) times daily.    buPROPion (WELLBUTRIN SR) 150 MG 12 hr tablet Take 1 tablet by mouth 2 (two) times daily.    FLUoxetine (PROZAC) 40 MG capsule Take 1 capsule by mouth daily.    trazodone (DESYREL) 300 MG tablet Take 1 tablet by mouth at bedtime.         DISCHARGE INSTRUCTIONS:    Follow with Dr. Sherryll BurgerShah in 2 weeks.  If you experience worsening of your admission symptoms, develop shortness of breath, life threatening emergency, suicidal or homicidal thoughts you must seek medical attention immediately by calling 911 or calling your MD immediately  if symptoms less severe.  You Must read complete instructions/literature along with all the possible adverse reactions/side effects for all the Medicines you take and that have been prescribed to you. Take any new Medicines after you have completely understood and accept all the possible adverse reactions/side effects.   Please note  You were cared for by a hospitalist during your hospital stay. If you have any questions about your  discharge medications or the care you received while you were in the hospital after you are discharged, you can call the unit and asked to speak with the hospitalist on call if the hospitalist that took care of you is not available. Once you are discharged, your primary care physician will handle any further medical issues. Please note that NO REFILLS for any discharge medications will be authorized once you are discharged, as it is imperative that you return to your primary care physician (or establish a relationship with a primary care physician if you do not have  one) for your aftercare needs so that they can reassess your need for medications and monitor your lab values.    Today   CHIEF COMPLAINT:   Chief Complaint  Patient presents with  . Dizziness    HISTORY OF PRESENT ILLNESS:  Patricia [name]  is a 54 y.o. female with a known history of anxiety, depression and alcohol abuse. The patient presents to the ED with double vision and ataxia for 1 months. She also complains of full body shaking and tremor but denies any syncope, loss of consciousness or seizure. She denies any incontinence. Patient quit drinking alcohol 14 years ago but resume alcohol 2 years ago. He hasn't drank any alcohol for the past one months. She was skating and AA meeting and had some double vision, ataxia and shaking, which are worse than normal. On-call neurologist evaluated the patient and suggest admission for further workup  VITAL SIGNS:  Blood pressure 98/63, pulse 74, temperature 98.3 F (36.8 C), temperature source Oral, resp. rate 18, height  (1.676 m), weight 54.885 kg (121 lb), SpO2 99 %.  I/O:   Intake/Output Summary (Last 24 hours) at 02/08/15 1244 Last data filed at 02/08/15 0800  Gross per 24 hour  Intake    480 ml  Output      0 ml  Net    480 ml    PHYSICAL EXAMINATION:  GENERAL:  54 y.o.-year-old thin patient lying in the bed with no acute distress.  EYES: Pupils equal, round, reactive to light and accommodation. No scleral icterus. Extraocular muscles intact.  HEENT: Head atraumatic, normocephalic. Oropharynx and nasopharynx clear.  NECK:  Supple, no jugular venous distention. No thyroid enlargement, no tenderness.  LUNGS: Normal breath sounds bilaterally, no wheezing, rales,rhonchi or crepitation. No use of accessory muscles of respiration.  CARDIOVASCULAR: S1, S2 normal. No murmurs, rubs, or gallops.  ABDOMEN: Soft, non-tender, non-distended. Bowel sounds present. No organomegaly or mass.  EXTREMITIES: No pedal edema, cyanosis, or clubbing.   NEUROLOGIC: Cranial nerves II through XII are intact. Muscle strength 5/5 in all extremities. Sensation intact. Gait not checked. Some tremors. PSYCHIATRIC: The patient is alert and oriented x 3.  SKIN: No obvious rash, lesion, or ulcer.   DATA REVIEW:   CBC  Recent Labs Lab 02/06/15 1353  WBC 4.8  HGB 14.1  HCT 43.0  PLT 201    Chemistries   Recent Labs Lab 02/06/15 1353 02/06/15 1820  NA 136  --   K 4.4  --   CL 100*  --   CO2 30  --   GLUCOSE 84  --   BUN 8  --   CREATININE 0.85  --   CALCIUM 9.6  --   MG  --  2.1  AST 34  --   ALT 30  --   ALKPHOS 63  --   BILITOT 0.8  --     Cardiac Enzymes  Recent  Labs Lab 02/06/15 1353  TROPONINI <0.03    Microbiology Results  Results for orders placed or performed in visit on 07/30/13  Urine culture     Status: None   Collection Time: 08/02/13  6:00 PM  Result Value Ref Range Status   Micro Text Report   Final       SOURCE: INDWELLING CATH    COMMENT                   NO GROWTH IN 36 HOURS   ANTIBIOTIC                                                      Culture, blood (single)     Status: None   Collection Time: 08/02/13  7:28 PM  Result Value Ref Range Status   Micro Text Report   Final       COMMENT                   NO GROWTH AEROBICALLY/ANAEROBICALLY IN 5 DAYS   ANTIBIOTIC                                                      Culture, blood (single)     Status: None   Collection Time: 08/02/13  7:28 PM  Result Value Ref Range Status   Micro Text Report   Final       COMMENT                   NO GROWTH AEROBICALLY/ANAEROBICALLY IN 5 DAYS   ANTIBIOTIC                                                      Culture, expectorated sputum-assessment     Status: None   Collection Time: 08/03/13  9:47 AM  Result Value Ref Range Status   Micro Text Report   Final       ORGANISM 1                HEAVY GROWTH STAPHYLOCOCCUS AUREUS   GRAM STAIN                EXCELLENT SPECIMEN-90-100% WBC   GRAM  STAIN                MANY WHITE BLOOD CELLS   GRAM STAIN                MANY GRAM POSITIVE COCCI IN PAIRS AND CLUSTERS   ANTIBIOTIC                    ORG#1     CIPROFLOXACIN                 S         CLINDAMYCIN                   S         ERYTHROMYCIN  S         GENTAMICIN                    S         LEVOFLOXACIN                  S         LINEZOLID                     S         OXACILLIN                     S         TIGECYCLINE                   S         CEFOXITIN SCREEN              NEGATIVE  INDUCIBLE CLINDAMYCIN RESISTANNEGATIVE  TRIMETHOPRIM/SULFAMETHOXAZOLE S           Culture, blood (single)     Status: None   Collection Time: 08/16/13  9:15 AM  Result Value Ref Range Status   Micro Text Report   Final       SOURCE: left hand    COMMENT                   NO GROWTH AEROBICALLY/ANAEROBICALLY IN 5 DAYS   ANTIBIOTIC                                                      Culture, blood (single)     Status: None   Collection Time: 08/16/13  9:15 AM  Result Value Ref Range Status   Micro Text Report   Final       SOURCE: left hand    COMMENT                   NO GROWTH AEROBICALLY/ANAEROBICALLY IN 5 DAYS   ANTIBIOTIC                                                        RADIOLOGY:  Ct Head Wo [name]  02/06/2015  CLINICAL DATA:  Double vision.  Weakness. EXAM: CT HEAD WITHOUT [name] TECHNIQUE: Contiguous axial images were obtained from the base of the skull through the vertex without intravenous [name]. COMPARISON:  07/30/2013. FINDINGS: No acute intracranial hemorrhage. No focal mass lesion. No CT evidence of acute infarction. No midline shift or mass effect. No hydrocephalus. Basilar cisterns are patent. Paranasal sinuses and  mastoid air cells are clear. IMPRESSION: Normal head CT. Electronically Signed   By: Genevive Bi M.D.   On: 02/06/2015 14:02   Patricia [name]  02/07/2015  CLINICAL DATA:  54 year old female with double vision  and ataxia for 1 month. Tremor. Initial encounter. EXAM: MRI HEAD WITHOUT [name] TECHNIQUE: Multiplanar, multiecho pulse sequences of the brain and surrounding structures were obtained without intravenous [name]. COMPARISON:  Head CT without [name] 02/06/2015. FINDINGS: Cerebral volume is within normal limits. No restricted diffusion to suggest acute infarction.  No midline shift, mass effect, evidence of mass lesion, ventriculomegaly, extra-axial collection or acute intracranial hemorrhage. Cervicomedullary junction and pituitary are within normal limits. Major intracranial vascular flow voids are within normal limits. Incidental prominent right sigmoid sinus arachnoid granulation. Dominant appearing distal left vertebral artery. There is a small chronic micro hemorrhage in the right frontal lobe seen on series 9, image 16. Otherwise gray and white matter signal is within normal limits. Negative visualized cervical spine. Visible internal auditory structures appear normal. Mastoids are clear. Paranasal sinuses are clear. Orbit and scalp soft tissues are within normal limits. Normal bone marrow signal. IMPRESSION: 1.  No acute intracranial abnormality. 2. Largely unremarkable noncontrast MRI appearance of the brain. There is a nonspecific solitary chronic micro hemorrhage in the right frontal lobe. Electronically Signed   By: Odessa Fleming M.D.   On: 02/07/2015 13:38   Patricia Cervical Spine Wo [name]  02/07/2015  CLINICAL DATA:  Ataxia.  Diplopia.  Shaking and tremor.  Ataxia. EXAM: MRI CERVICAL SPINE WITHOUT [name] TECHNIQUE: Multiplanar, multisequence Patricia imaging of the cervical spine was performed. No intravenous [name] was administered. COMPARISON:  None. FINDINGS: The craniocervical junction appears unremarkable. 1.5 mm degenerative retrolisthesis at C5-6. Disc desiccation noted at all levels between C2 and C7 with loss of disc height at C4-5 and C5-6. No significant vertebral marrow edema is identified. No  significant abnormal spinal cord signal is observed. Additional findings at individual levels are as follows: C2-3:  Unremarkable. C3-4: Mild right foraminal stenosis due to uncinate spurring. Mild disc bulge. C4-5: Moderate central narrowing of the thecal sac and mild to moderate bilateral foraminal stenosis due to uncinate spurring and disc bulge. C5-6: Moderate bilateral foraminal stenosis and borderline central narrowing of the thecal sac due to uncinate spurring and disc bulge. Small central annular tear. C6-7:  No impingement.  Mild disc bulge. C7-T1:  Unremarkable. IMPRESSION: 1. Cervical spondylosis and degenerative disc disease, causing moderate impingement at C4-5 and C5-6, and mild impingement at C3-4, as detailed above. Electronically Signed   By: Gaylyn Rong M.D.   On: 02/07/2015 13:37      Management plans discussed with the patient, family and they are in agreement.  CODE STATUS:     Code Status Orders        Start     Ordered   02/06/15 1801  Full code   Continuous     02/06/15 1800      TOTAL TIME TAKING CARE OF THIS PATIENT: 35 minutes.    Altamese Dilling M.D on 02/08/2015 at 12:44 PM  Between 7am to 6pm - Pager - [phone]  After 6pm go to [email or web address] - password [email or web address]  [facility]  Office  [phone]  CC: Primary care physician; Berneice Gandy, MD   Note: This dictation was prepared with Dragon dictation along with smaller phrase technology. Any transcriptional errors that result from this process are unintentional.

## 2015-02-08 NOTE — Clinical Social Work Note (Signed)
This CSW received consult today due to ETOH relapse. Patient was discharged by nursing staff prior to being seen by CSW. CSW spoke with attending/discharging physician today and he informed CSW that patient and patient's husband reported patient has been attending Alcoholics Anonymous meetings and was going to continue going to these meetings. Patient reported to MD that she believes that she feels much better today and will plan on following up with AA. York SpanielMonica Kanae [name] MSW,LCSW [phone]

## 2015-04-28 ENCOUNTER — Encounter: Payer: Self-pay | Admitting: Medical Oncology

## 2015-04-28 ENCOUNTER — Emergency Department
Admission: EM | Admit: 2015-04-28 | Discharge: 2015-04-28 | Disposition: A | Discharge status: Home / Self Care | Payer: BLUE CROSS/BLUE SHIELD | Attending: Emergency Medicine | Admitting: Emergency Medicine

## 2015-04-28 DIAGNOSIS — R4182 Altered mental status, unspecified: Secondary | ICD-10-CM | POA: Diagnosis present

## 2015-04-28 DIAGNOSIS — T50905A Adverse effect of unspecified drugs, medicaments and biological substances, initial encounter: Secondary | ICD-10-CM

## 2015-04-28 DIAGNOSIS — Z880 Allergy status to penicillin: Secondary | ICD-10-CM | POA: Diagnosis not present

## 2015-04-28 DIAGNOSIS — Y9389 Activity, other specified: Secondary | ICD-10-CM | POA: Diagnosis not present

## 2015-04-28 DIAGNOSIS — Y9289 Other specified places as the place of occurrence of the external cause: Secondary | ICD-10-CM | POA: Diagnosis not present

## 2015-04-28 DIAGNOSIS — Y998 Other external cause status: Secondary | ICD-10-CM | POA: Diagnosis not present

## 2015-04-28 DIAGNOSIS — R296 Repeated falls: Secondary | ICD-10-CM | POA: Diagnosis not present

## 2015-04-28 DIAGNOSIS — T424X1A Poisoning by benzodiazepines, accidental (unintentional), initial encounter: Secondary | ICD-10-CM | POA: Diagnosis not present

## 2015-04-28 DIAGNOSIS — Z87891 Personal history of nicotine dependence: Secondary | ICD-10-CM | POA: Insufficient documentation

## 2015-04-28 LAB — COMPREHENSIVE METABOLIC PANEL
ALBUMIN: 4.1 g/dL (ref 3.5–5.0)
ALT: 26 U/L (ref 14–54)
AST: 32 U/L (ref 15–41)
Alkaline Phosphatase: 73 U/L (ref 38–126)
Anion gap: 6 (ref 5–15)
BUN: 8 mg/dL (ref 6–20)
CHLORIDE: 100 mmol/L — AB (ref 101–111)
CO2: 31 mmol/L (ref 22–32)
CREATININE: 0.95 mg/dL (ref 0.44–1.00)
Calcium: 9.5 mg/dL (ref 8.9–10.3)
GFR calc Af Amer: >60 mL/min (ref >60)
GFR calc non Af Amer: >60 mL/min (ref >60)
GLUCOSE: 73 mg/dL (ref 65–99)
POTASSIUM: 4.3 mmol/L (ref 3.5–5.1)
Sodium: 137 mmol/L (ref 135–145)
Total Bilirubin: 0.5 mg/dL (ref 0.3–1.2)
Total Protein: 6.6 g/dL (ref 6.5–8.1)

## 2015-04-28 LAB — CBC
HEMATOCRIT: 36.9 % (ref 35.0–47.0)
Hemoglobin: 12.4 g/dL (ref 12.0–16.0)
MCH: 31.4 pg (ref 26.0–34.0)
MCHC: 33.7 g/dL (ref 32.0–36.0)
MCV: 93.2 fL (ref 80.0–100.0)
Platelets: 229 10*3/uL (ref 150–440)
RBC: 3.96 MIL/uL (ref 3.80–5.20)
RDW: 13.4 % (ref 11.5–14.5)
WBC: 3.7 10*3/uL (ref 3.6–11.0)

## 2015-04-28 LAB — GLUCOSE, CAPILLARY: GLUCOSE-CAPILLARY: 68 mg/dL (ref 65–99)

## 2015-04-28 LAB — TROPONIN I: TROPONIN I: 0.03 ng/mL (ref <0.031)

## 2015-04-28 LAB — TSH: TSH: 1.404 u[IU]/mL (ref 0.350–4.500)

## 2015-04-28 MED ORDER — FLUMAZENIL 0.5 MG/5ML IV SOLN
0.1000 mg | Freq: Once | INTRAVENOUS | Status: AC
  Administered 2015-04-28: 0.1 mg via INTRAVENOUS
  Filled 2015-04-28: qty 5

## 2015-04-28 NOTE — ED Notes (Signed)
Pts husband reports pt has been acting excessively sleepy, confused and dizzy x 2 months. Pt is arousable and alert in triage. Pt does seem to fall asleep easily during triage, Pt denies pain.

## 2015-04-28 NOTE — ED Provider Notes (Signed)
[facility] Emergency Department Provider Note     Time seen: ----------------------------------------- 6:19 PM on 04/28/2015 -----------------------------------------    I have reviewed the triage vital signs and the nursing notes.   HISTORY  Chief Complaint Weakness; Dizziness; and Altered Mental Status    HPI Patricia [name] is a 55 y.o. female who presents to the ER for drowsiness fatigue and confusion for the last 2 months. Patient has had frequent falls according to her husband, she falls asleep easily even when talking to people. She states her doctors have been trying to decrease her medication, she doesn't drink anymore, she's not had any recent illness. Nothing makes her symptoms better. Husband states she's taking extra medications.   Past Medical History  Diagnosis Date  . Aspiration into airway   . Depression unk  . Anxiety unk  . Alcohol abuse unk    Patient Active Problem List   Diagnosis Date Noted  . Ataxia 02/06/2015    Past Surgical History  Procedure Laterality Date  . Tracheal surgery    . Abdominal hysterectomy      Allergies Penicillins  Social History Social History  Substance Use Topics  . Smoking status: Former Smoker    Types: Cigarettes  . Smokeless tobacco: Never Used  . Alcohol Use: No     Comment: former alcoholic "sober for a month now"    Review of Systems Constitutional: Negative for fever. Eyes: Negative for visual changes. ENT: Negative for sore throat. Cardiovascular: Negative for chest pain. Respiratory: Negative for shortness of breath. Gastrointestinal: Negative for abdominal pain, vomiting and diarrhea. Genitourinary: Negative for dysuria. Musculoskeletal: Negative for back pain. Skin: Negative for rash. Neurological: Negative for headaches, positive for weakness, drowsiness  10-point ROS otherwise negative.  ____________________________________________   PHYSICAL EXAM:  VITAL  SIGNS: ED Triage Vitals  Enc Vitals Group     BP 04/28/15 1523 98/60 mmHg     Pulse Rate 04/28/15 1523 70     Resp 04/28/15 1523 17     Temp 04/28/15 1523 97.9 F (36.6 C)     Temp Source 04/28/15 1523 Oral     SpO2 04/28/15 1523 97 %     Weight 04/28/15 1523 121 lb (54.885 kg)     Height --      Head Cir --      Peak Flow --      Pain Score --      Pain Loc --      Pain Edu? --      Excl. in GC? --     Constitutional: Drowsy but oriented, no acute distress. Patient falls asleep while talking to me Eyes: Conjunctivae are normal. PERRL. Normal extraocular movements. ENT   Head: Normocephalic and atraumatic.   Nose: No congestion/rhinnorhea.   Mouth/Throat: Mucous membranes are moist.   Neck: No stridor. Cardiovascular: Normal rate, regular rhythm. Normal and symmetric distal pulses are present in all extremities. No murmurs, rubs, or gallops. Respiratory: Normal respiratory effort without tachypnea nor retractions. Breath sounds are clear and equal bilaterally. No wheezes/rales/rhonchi. Gastrointestinal: Soft and nontender. No distention. No abdominal bruits.  Musculoskeletal: Nontender with normal range of motion in all extremities. No joint effusions.  No lower extremity tenderness nor edema. Neurologic:  Normal speech and language. No gross focal neurologic deficits are appreciated. Speech is normal. No gait instability. Skin:  Skin is warm, dry and intact. No rash noted. Psychiatric: Mood and affect are normal.  ____________________________________________  EKG: Interpreted by me. Sinus  rhythm with a rate of 71 bpm, normal PR interval, normal QRS, normal QT interval. Normal axis. Nonspecific ST and T-wave changes.  ____________________________________________  ED COURSE:  Pertinent labs & imaging results that were available during my care of the patient were reviewed by me and considered in my medical decision making (see chart for details).  patient is in no  acute distress, is likely just overmedicated.  ____________________________________________    LABS (pertinent positives/negatives)  Labs Reviewed  COMPREHENSIVE METABOLIC PANEL - Abnormal; Notable for the following:    Chloride 100 (*)    All other components within normal limits  CBC  GLUCOSE, CAPILLARY  TROPONIN I  TSH  URINALYSIS COMPLETEWITH MICROSCOPIC ([facility] ONLY)  URINE DRUG SCREEN, QUALITATIVE ([facility] ONLY)  CBG MONITORING, ED   ____________________________________________  FINAL ASSESSMENT AND PLAN   Overmedication  Plan: Patient with labs as dictated above. Patient appears to be overmedicated or is accidentally taking too much for medications. I would advise going down on the Xanax to 1 mg twice a day rather than 2 mg. Otherwise she stable for outpatient follow-up. Lab testing here is been normal.   Emily Filbert, MD   Emily Filbert, MD 04/28/15 [phone]

## 2015-04-28 NOTE — Discharge Instructions (Signed)
Sedative Ingestion  The risk of serious problems from ingesting any sedative depends on the amount of drug taken and whether it is mixed with other drugs or alcohol. The most common group of sedatives are benzodiazepines, including:  Lorazepam.  Flurazepam.  Triazolam.  Chlordiazepoxide.  Oxazepam.  Diazepam.  Alprazolam. Sedatives may be prescribed for insomnia, anxiety, muscle tension, and alcohol or drug withdrawal symptoms. SYMPTOMS A sedative overdose causes symptoms similar to alcohol intoxication. These include:  Loss of coordination.  Slurred speech.  Slowed breathing.  Poor judgment.  Memory loss.  Drowsiness.  Blackouts.  Coma. Taking too many sedatives can cause:  Respiratory depression.  Vomiting.  Dehydration.  Low blood pressure.  Death. HOME CARE INSTRUCTIONS  At this point, hospital care is not needed.  You are at an increased risk for injury when on sedative drugs, especially when you drive or operate machinery. It is very important that someone watches you closely for the next 24-48 hours and calls for emergency help if you have trouble breathing or cannot be awakened from sleep.  You may have a hangover after sedative ingestion. Get plenty of rest and drink increased amounts of non-alcoholic fluids.  A sedative ingestion is often a sign of a severe emotional state or depression. If you have been taking a sedative medicine regularly for a long time and stop suddenly, you may have withdrawal symptoms, including anxiety, agitation, headache, and more serious symptoms. Be sure to see your doctor or counselor for further treatment to address these emotional and physical issues. SEEK IMMEDIATE MEDICAL CARE IF:   You develop recurrent dizziness or weakness or you faint.  You have trouble breathing.  You have a seizure.   This information is not intended to replace advice given to you by your health care provider. Make sure you discuss any  questions you have with your health care provider.   Document Released: 03/24/2004 Document Revised: 05/09/2011 Document Reviewed: 08/06/2014 Elsevier Interactive Patient Education [employer]

## 2015-04-28 NOTE — ED Notes (Signed)
Blood Glu = 68

## 2015-05-01 DIAGNOSIS — F419 Anxiety disorder, unspecified: Secondary | ICD-10-CM | POA: Diagnosis present

## 2015-05-27 ENCOUNTER — Ambulatory Visit (INDEPENDENT_AMBULATORY_CARE_PROVIDER_SITE_OTHER): Payer: BLUE CROSS/BLUE SHIELD | Admitting: Psychiatry

## 2015-05-27 ENCOUNTER — Encounter: Payer: Self-pay | Admitting: Psychiatry

## 2015-05-27 VITALS — BP 118/70 | HR 89 | Temp 98.3°F | Ht 66.0 in | Wt 130.4 lb

## 2015-05-27 DIAGNOSIS — F411 Generalized anxiety disorder: Secondary | ICD-10-CM | POA: Diagnosis not present

## 2015-05-27 DIAGNOSIS — F331 Major depressive disorder, recurrent, moderate: Secondary | ICD-10-CM | POA: Diagnosis not present

## 2015-05-27 MED ORDER — ALPRAZOLAM ER 1 MG PO TB24
1.0000 mg | ORAL_TABLET | Freq: Two times a day (BID) | ORAL | Status: DC
Start: 2015-05-27 — End: 2015-07-01

## 2015-05-27 MED ORDER — TRAZODONE HCL 100 MG PO TABS: 200.0000 mg | ORAL_TABLET | Freq: Every day | ORAL | Status: DC

## 2015-05-27 MED ORDER — FLUOXETINE HCL 20 MG PO CAPS: 60.0000 mg | ORAL_CAPSULE | Freq: Every day | ORAL | Status: DC
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
## 2015-06-29 ENCOUNTER — Ambulatory Visit: Payer: BLUE CROSS/BLUE SHIELD | Admitting: Psychiatry

## 2015-07-01 ENCOUNTER — Ambulatory Visit (INDEPENDENT_AMBULATORY_CARE_PROVIDER_SITE_OTHER): Payer: BLUE CROSS/BLUE SHIELD | Admitting: Psychiatry

## 2015-07-01 ENCOUNTER — Encounter: Payer: Self-pay | Admitting: Psychiatry

## 2015-07-01 VITALS — BP 120/82 | HR 86 | Temp 97.5°F | Ht 66.0 in | Wt 136.8 lb

## 2015-07-01 DIAGNOSIS — F411 Generalized anxiety disorder: Secondary | ICD-10-CM | POA: Diagnosis not present

## 2015-07-01 DIAGNOSIS — F331 Major depressive disorder, recurrent, moderate: Secondary | ICD-10-CM | POA: Diagnosis not present

## 2015-07-01 MED ORDER — FLUOXETINE HCL 40 MG PO CAPS: 80.0000 mg | ORAL_CAPSULE | Freq: Every day | ORAL | Status: DC

## 2015-07-01 MED ORDER — TRAZODONE HCL 100 MG PO TABS: 200.0000 mg | ORAL_TABLET | Freq: Every day | ORAL | Status: DC

## 2015-07-01 MED ORDER — LAMOTRIGINE 200 MG PO TABS: 200.0000 mg | ORAL_TABLET | Freq: Every day | ORAL | Status: DC

## 2015-07-01 MED ORDER — ALPRAZOLAM 0.5 MG PO TABS: ORAL_TABLET | ORAL | Status: DC

## 2015-07-01 NOTE — Progress Notes (Signed)
Patient ID: Patricia [name], female   DOB: 1960-04-05, 55 y.o.   MRN: [id number]  Psychiatric Progress Note  [name] MRN:  [id number] Chief Complaint:   Chief Complaint    Follow-up; Medication Refill; Panic Attack; Insomnia; Weight Gain     Visit Diagnosis:    ICD-9-CM ICD-10-CM   1. Moderate episode of recurrent major depressive disorder (HCC) 296.32 F33.1   2. Anxiety state 300.00 F41.1     History of Present Illness:  Patient is a 55 year old woman who presents for Follow-up of depression and anxiety. Patient states that she still not doing well. Patient talks in a very low voice and is barely audible. She states that she was doing better on the Prozac at 80 mg. States she is sleeping okay and eating well. States she gained 8 pounds. When we discussed her Xanax taper and how she was doing on it she stated that she was taking the 2 mg twice daily. She told the nurse that she was taking 2 mg twice daily as well. This clinician then discussed with the patient that she was given a prescription for 1 mg twice daily as a taper and referred. Patient then stated that she is taking what  is on the bottle. Patient is not being truthful and evasive about the dosage that she is taking. She states at the end that she's probably been taking the 1 mg twice daily since that was what prescribed by this clinician. She is asking for an increase in the trazodone to 300 mg. She is not forthcoming about any of her mood symptoms or elaborating on them. Denies any suicidal thoughts.  Past Medical History:  Past Medical History  Diagnosis Date  . Aspiration into airway   . Depression unk  . Anxiety unk  . Alcohol abuse unk    Past Surgical History  Procedure Laterality Date  . Tracheal surgery    . Abdominal hysterectomy    . Foot surgery      Family Psychiatric History: Mother has anxiety and depression..  Family History:  Family History  Problem Relation Age of Onset   . Breast cancer Mother   . Hypertension Mother   . Depression Mother   . Anxiety disorder Mother   . Alcohol abuse Father   . Alcohol abuse Sister   . Anxiety disorder Sister   . Bipolar disorder Sister     Social History:   Social History   Social History  . Marital Status: Married    Spouse Name: N/A  . Number of Children: N/A  . Years of Education: N/A   Social History Main Topics  . Smoking status: Former Smoker    Types: Cigarettes  . Smokeless tobacco: Never Used  . Alcohol Use: No     Comment: former alcoholic "sober for a month now"  . Drug Use: No  . Sexual Activity: Not Currently   Other Topics Concern  . None   Social History Narrative    Additional Social History: Patient is married with 3 adult sons, her oldest is doing well,  Middle one is not doing anything, youngest son is working.  Allergies:   Allergies  Allergen Reactions  . Penicillins Hives    Metabolic Disorder Labs: Lab Results  Component Value Date   HGBA1C 4.9 02/07/2015   MPG 103 08/17/2013   No results found for: PROLACTIN Lab Results  Component Value Date   CHOL 232* 02/07/2015   TRIG 82 02/07/2015  HDL 55 02/07/2015   CHOLHDL 4.2 02/07/2015   VLDL 16 02/07/2015   LDLCALC 161* 02/07/2015     Current Medications: Current Outpatient Prescriptions  Medication Sig Dispense Refill  . albuterol (VENTOLIN HFA) 108 (90 BASE) MCG/ACT inhaler Inhale 2 puffs into the lungs every 6 (six) hours as needed for wheezing or shortness of breath.     Marland Kitchen atorvastatin (LIPITOR) 40 MG tablet Take 1 tablet (40 mg total) by mouth daily at 6 PM. 30 tablet 0  . FLUoxetine (PROZAC) 20 MG capsule Take 3 capsules (60 mg total) by mouth daily. 90 capsule 1  . folic acid (FOLVITE) 1 MG tablet Take 1 tablet (1 mg total) by mouth daily. 30 tablet 0  . lamoTRIgine (LAMICTAL) 200 MG tablet Take 1 tablet (200 mg total) by mouth daily. 30 tablet 0  . Multiple Vitamin (MULTIVITAMIN WITH MINERALS) TABS  tablet Take 1 tablet by mouth daily. 30 tablet 0  . thiamine (VITAMIN B-1) 100 MG tablet Take 1 tablet (100 mg total) by mouth daily. 30 tablet 0  . ALPRAZolam (XANAX XR) 2 MG 24 hr tablet TK 1 T PO BID  3  . traZODone (DESYREL) 150 MG tablet   6   No current facility-administered medications for this visit.    Neurologic: Headache: No Seizure: No Paresthesias:No  Musculoskeletal: Strength & Muscle Tone: within normal limits Gait & Station: normal Patient leans: N/A  Psychiatric Specialty Exam: ROS  Blood pressure 120/82, pulse 86, temperature 97.5 F (36.4 C), temperature source Tympanic, height  (1.676 m), weight 136 lb 12.8 oz (62.052 kg), SpO2 96 %.Body mass index is 22.09 kg/(m^2).  General Appearance: Casual  Eye Contact:  Minimal  Speech:  Slow  Volume:  Decreased  Mood:  Anxious, Depressed and Dysphoric  Affect:  Constricted and Depressed  Thought Process:  Coherent  Orientation:  Full (Time, Place, and Person)  Thought Content:  Rumination  Suicidal Thoughts:  No  Homicidal Thoughts:  No  Memory:  Immediate;   Fair Recent;   Fair Remote;   Fair  Judgement:  Fair  Insight:  Fair  Psychomotor Activity:  Decreased  Concentration:  Fair  Recall:  [employer] of Knowledge:Fair  Language: Fair  Akathisia:  No  Handed:  Right  AIMS (if indicated):  na  Assets:  Desire for Improvement Financial Resources/Insurance Housing  ADL's:  Intact  Cognition: WNL  Sleep:  fair    Treatment Plan Summary: Medication management   Major Depressive disorder Continue Lamictal at 200 mg once daily Increase Prozac to  po qam. Patient has been seeing a therapist, continue with that  Anxiety Same as above   Patient not very clear on her taper of Xanax. She told the nurse and this M.D. initially that she was taking 2 mg twice daily. When patient was asked about the taper we had discussed last time to 1 mg twice daily, patient then states that she's been taking  the 1 mg twice daily but it says  2 mg on the bottle. Patient is not being forthcoming about the actual dosage she is taking. However nurse checked with the pharmacy and the patient had filled the 1 mg twice daily Xanax prescription. So we will continue the taper further Discussed with her that we will taper further 0.5 mg twice daily for 5 days and then take 0.5 mg daily for 10 days and then stop. Patient agreeable to this.  Eating disorder Patient reports she is doing well  and has gained 8 pounds.  Return to clinic in 3 month's time or call before if needed.  Patrick NorthAVI, Marris Frontera, MD 5/3/20171:17 PM

## 2015-07-30 ENCOUNTER — Other Ambulatory Visit: Payer: Self-pay | Admitting: Psychiatry

## 2015-07-30 ENCOUNTER — Ambulatory Visit: Payer: BLUE CROSS/BLUE SHIELD | Admitting: Psychiatry

## 2015-09-27 ENCOUNTER — Encounter: Payer: Self-pay | Admitting: Emergency Medicine

## 2015-09-27 ENCOUNTER — Emergency Department
Admission: EM | Admit: 2015-09-27 | Discharge: 2015-09-28 | Disposition: A | Discharge status: Other Acute Inpatient Hospital | Payer: BLUE CROSS/BLUE SHIELD | Attending: Emergency Medicine | Admitting: Emergency Medicine

## 2015-09-27 DIAGNOSIS — T50902A Poisoning by unspecified drugs, medicaments and biological substances, intentional self-harm, initial encounter: Secondary | ICD-10-CM

## 2015-09-27 DIAGNOSIS — Z87891 Personal history of nicotine dependence: Secondary | ICD-10-CM | POA: Diagnosis not present

## 2015-09-27 DIAGNOSIS — T426X2A Poisoning by other antiepileptic and sedative-hypnotic drugs, intentional self-harm, initial encounter: Secondary | ICD-10-CM | POA: Diagnosis not present

## 2015-09-27 DIAGNOSIS — F332 Major depressive disorder, recurrent severe without psychotic features: Secondary | ICD-10-CM | POA: Diagnosis not present

## 2015-09-27 DIAGNOSIS — Z79899 Other long term (current) drug therapy: Secondary | ICD-10-CM | POA: Diagnosis not present

## 2015-09-27 DIAGNOSIS — T40601A Poisoning by unspecified narcotics, accidental (unintentional), initial encounter: Secondary | ICD-10-CM

## 2015-09-27 DIAGNOSIS — F1590 Other stimulant use, unspecified, uncomplicated: Secondary | ICD-10-CM

## 2015-09-27 LAB — CBC
HEMATOCRIT: 39.2 % (ref 35.0–47.0)
Hemoglobin: 13.5 g/dL (ref 12.0–16.0)
MCH: 31.1 pg (ref 26.0–34.0)
MCHC: 34.5 g/dL (ref 32.0–36.0)
MCV: 90.3 fL (ref 80.0–100.0)
Platelets: 202 10*3/uL (ref 150–440)
RBC: 4.34 MIL/uL (ref 3.80–5.20)
RDW: 14 % (ref 11.5–14.5)
WBC: 8.6 10*3/uL (ref 3.6–11.0)

## 2015-09-27 LAB — COMPREHENSIVE METABOLIC PANEL
ALBUMIN: 4.2 g/dL (ref 3.5–5.0)
ALK PHOS: 75 U/L (ref 38–126)
ALT: 20 U/L (ref 14–54)
ANION GAP: 13 (ref 5–15)
AST: 33 U/L (ref 15–41)
BILIRUBIN TOTAL: 0.7 mg/dL (ref 0.3–1.2)
BUN: 14 mg/dL (ref 6–20)
CALCIUM: 9.4 mg/dL (ref 8.9–10.3)
CO2: 24 mmol/L (ref 22–32)
Chloride: 100 mmol/L — ABNORMAL LOW (ref 101–111)
Creatinine, Ser: 0.84 mg/dL (ref 0.44–1.00)
GFR calc Af Amer: >60 mL/min (ref >60)
GLUCOSE: 124 mg/dL — AB (ref 65–99)
Potassium: 4.2 mmol/L (ref 3.5–5.1)
Sodium: 137 mmol/L (ref 135–145)
TOTAL PROTEIN: 7 g/dL (ref 6.5–8.1)

## 2015-09-27 LAB — URINE DRUG SCREEN, QUALITATIVE (ARMC ONLY)
Amphetamines, Ur Screen: POSITIVE — AB
BARBITURATES, UR SCREEN: NOT DETECTED
BENZODIAZEPINE, UR SCRN: NOT DETECTED
Cannabinoid 50 Ng, Ur ~~LOC~~: NOT DETECTED
Cocaine Metabolite,Ur ~~LOC~~: NOT DETECTED
MDMA (Ecstasy)Ur Screen: NOT DETECTED
METHADONE SCREEN, URINE: NOT DETECTED
OPIATE, UR SCREEN: POSITIVE — AB
PHENCYCLIDINE (PCP) UR S: NOT DETECTED
Tricyclic, Ur Screen: NOT DETECTED

## 2015-09-27 LAB — ACETAMINOPHEN LEVEL

## 2015-09-27 LAB — SALICYLATE LEVEL: Salicylate Lvl: <4 mg/dL (ref 2.8–30.0)

## 2015-09-27 LAB — ETHANOL

## 2015-09-27 NOTE — ED Triage Notes (Signed)
[facility] reports that pt's husband stated that he "splits his prescription" of 150 5mg  oxycodone with his wife and that she took approximately 50-75 tablets of oxycodone and 13-14 of Ambien. Pt's husband reported to EMS that pt slept all day yesterday and all night but that he became concerned when she would not rouse this AM. Per [facility] pt was given 1mg  Narcan at the scene and pt's VS were WDL. Pt is anxious but answering questions at this time in triage.

## 2015-09-27 NOTE — ED Notes (Addendum)
Patient ambulated with a steady gait in the hallway. Cardiac leads and monitoring equipment removed from patient. IV was removed by tech. Patient was informed of IVC status and what that means. Patient removed two rings and a bracelet which were placed in a specimen cup and given to the husband. Tech are aware that patient needs to be dressed in maroon scrubs. Husband is taking home the patient's clothes.
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
## 2015-09-27 NOTE — ED Notes (Signed)
MD D/C sitter

## 2015-09-27 NOTE — ED Notes (Signed)
sitter at bedside until pt is evaluated by MD, ODS outside room

## 2015-09-27 NOTE — ED Notes (Signed)
Pt's husband at bedside. Husband states that pill bottles were emptied the rest of the way by him and that she only took a few of the Ambien and Oxycodone.

## 2015-09-27 NOTE — ED Notes (Signed)

## 2015-09-27 NOTE — ED Notes (Signed)
Patient transferred from quad area. She is cooperative with initial nursing assessment. Patient currently denying she overdosed on pills as a suicide attempt; she says she is angry that husband is continuing to take narcotics. She does have a previous history of suicide attempts by overdosing.  Patient is a recovering alcoholic. She states she has a long history of depression and has had multiple hospitalizations in the past both for substance abuse and behavioral health. She is currently in treatment with a therapist and is prescribed Prozac, Trazadone, and Lamictal. Patient denies any psychosis except during ETOH withdrawal in the past.  Will monitor clinical status and maintain on 15 minute checks for safety.

## 2015-09-27 NOTE — ED Notes (Signed)
1L O2 replaced on patient. Patient given instructions to keep O2 in place until she is more alert and less drowsy. Patient agreed.

## 2015-09-27 NOTE — ED Notes (Signed)
BEHAVIORAL HEALTH ROUNDING Patient sleeping: Yes.   Patient alert and oriented: yes Behavior appropriate: Yes.  ; If no, describe: NA Nutrition and fluids offered: Yes  Toileting and hygiene offered: Yes  Sitter present: no Law enforcement present: No

## 2015-09-27 NOTE — ED Notes (Signed)
ED BHU PLACEMENT JUSTIFICATION Is the patient under IVC or is there intent for IVC: Yes.   Is the patient medically cleared: Yes.   Is there vacancy in the ED BHU: Yes.   Is the population mix appropriate for patient: Yes.   Is the patient awaiting placement in inpatient or outpatient setting: No. Has the patient had a psychiatric consult: No. Survey of unit performed for contraband, proper placement and condition of furniture, tampering with fixtures in bathroom, shower, and each patient room: Yes.  ; Findings: NA APPEARANCE/BEHAVIOR calm, cooperative and adequate rapport can be established NEURO ASSESSMENT Orientation: time, place and person Hallucinations: No.None noted (Hallucinations) Speech: Normal Gait: normal RESPIRATORY ASSESSMENT Normal expansion.  Clear to auscultation.  No rales, rhonchi, or wheezing. CARDIOVASCULAR ASSESSMENT regular rate and rhythm, S1, S2 normal, no murmur, click, rub or gallop GASTROINTESTINAL ASSESSMENT soft, nontender, BS WNL, no r/g EXTREMITIES normal strength, tone, and muscle mass PLAN OF CARE Provide calm/safe environment. Vital signs assessed twice daily. ED BHU Assessment once each 12-hour shift. Collaborate with intake RN daily or as condition indicates. Assure the ED provider has rounded once each shift. Provide and encourage hygiene. Provide redirection as needed. Assess for escalating behavior; address immediately and inform ED provider.  Assess family dynamic and appropriateness for visitation as needed: Yes.  ; If necessary, describe findings: na Educate the patient/family about BHU procedures/visitation: Yes.  ; If necessary, describe findings: NA

## 2015-09-27 NOTE — ED Notes (Signed)
Patient is sitting up on the stretcher, alert. Patient had taken cardiac leads, blood pressure cuff, and pulse ox off and states that she wants to go home. All were placed back on and explained why they need to be on. Husband remains at bedside.

## 2015-09-27 NOTE — ED Provider Notes (Signed)
[facility] Emergency Department Provider Note  Time seen: 7:24 AM  I have reviewed the triage vital signs and the nursing notes.   HISTORY  Chief Complaint Drug Overdose    HPI Patricia [name] is a 55 y.o. female with a past medical history of alcohol abuse, anxiety, depression, who presents the emergency department afternoon intentional overdose. According to the patient she took pain pills and Ambien. When asked specifically if she was trying to hurt herself she shakes her head yes.Husband believes she took the pills yesterday, states she was sleeping all day yesterday afternoon, this morning around 4:00 in the morning the husband noticed that she was not breathing very well so he called EMS to bring her to the emergency department. EMS gave the patient 1 mg of Narcan and brought her to the emergency department. Here the patient is awake, alert, oriented. Admits to taking the pills to try to kill her self.  Past Medical History:  Diagnosis Date  . Alcohol abuse unk  . Anxiety unk  . Aspiration into airway   . Depression unk    Patient Active Problem List   Diagnosis Date Noted  . Anxiety disorder 05/01/2015  . Recurrent major depressive disorder (HCC) 05/01/2015  . Ataxia 02/06/2015  . Allergic rhinitis 03/04/2014  . H/O infectious disease 01/14/2014  . 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 Abnormal toxicological findings 10/22/2013  . ADD (attention deficit disorder) 09/19/2013  . Anxiety 09/19/2013  . Clinical depression 09/19/2013  . H/O eating disorder 09/19/2013  . H/O alcohol abuse 09/19/2013  . Arterial blood pressure decreased 09/19/2013  . Respiratory failure (HCC) 09/19/2013  . Fast heart beat 09/19/2013    Past Surgical History:  Procedure Laterality Date  . ABDOMINAL HYSTERECTOMY    . FOOT SURGERY    . TRACHEAL SURGERY      Prior to Admission medications   Medication Sig Start Date End Date Taking?  Authorizing Provider  albuterol (VENTOLIN HFA) 108 (90 BASE) MCG/ACT inhaler Inhale 2 puffs into the lungs every 6 (six) hours as needed for wheezing or shortness of breath.     Historical Provider, MD  ALPRAZolam Prudy Feeler) 0.5 MG tablet Take 1 tablet by mouth twice daily for 5 days and then take 1 tablet by mouth daily for 10 days and then stop 07/01/15   Himabindu Daleen Bo, MD  atorvastatin (LIPITOR) 40 MG tablet Take 1 tablet (40 mg total) by mouth daily at 6 PM. 02/08/15   Altamese Dilling, MD  FLUoxetine (PROZAC) 40 MG capsule Take 2 capsules (80 mg total) by mouth daily. 07/01/15 06/30/16  Himabindu Ravi, MD  folic acid (FOLVITE) 1 MG tablet Take 1 tablet (1 mg total) by mouth daily. 02/08/15   Altamese Dilling, MD  lamoTRIgine (LAMICTAL) 200 MG tablet Take 1 tablet (200 mg total) by mouth daily. 07/01/15   Himabindu Ravi, MD  Multiple Vitamin (MULTIVITAMIN WITH MINERALS) TABS tablet Take 1 tablet by mouth daily. 02/08/15   Altamese Dilling, MD  thiamine (VITAMIN B-1) 100 MG tablet Take 1 tablet (100 mg total) by mouth daily. 02/08/15   Altamese Dilling, MD  traZODone (DESYREL) 100 MG tablet Take 2 tablets (200 mg total) by mouth at bedtime. 07/01/15   Patrick North, MD    Allergies  Allergen Reactions  . Penicillins Hives    Family History  Problem Relation Age of Onset  . Breast cancer Mother   . Hypertension Mother   . Depression Mother   . Anxiety  disorder Mother   . Alcohol abuse Father   . Alcohol abuse Sister   . Anxiety disorder Sister   . Bipolar disorder Sister     Social History Social History  Substance Use Topics  . Smoking status: Former Smoker    Types: Cigarettes  . Smokeless tobacco: Never Used  . Alcohol use No     Comment: former alcoholic "sober for a month now"    Review of Systems Constitutional: Negative for fever Cardiovascular: Negative for chest pain. Respiratory: Negative for shortness of breath. Gastrointestinal: Negative for abdominal  pain Musculoskeletal: Negative for back pain. Neurological: Negative for headaches, focal weakness or numbness. 10-point ROS otherwise negative.  ____________________________________________   PHYSICAL EXAM:  VITAL SIGNS: ED Triage Vitals  Enc Vitals Group     BP 09/27/15 0636 (!) 131/96     Pulse Rate 09/27/15 0636 86     Resp 09/27/15 0636 17     Temp 09/27/15 0636 99.3 F (37.4 C)     Temp Source 09/27/15 0636 Oral     SpO2 09/27/15 0636 96 %     Weight 09/27/15 0636 155 lb (70.3 kg)     Height 09/27/15 0636 5\' 5"  (1.651 m)     Head Circumference --      Peak Flow --      Pain Score 09/27/15 0637 8     Pain Loc --      Pain Edu? --      Excl. in GC? --     Constitutional: Alert and oriented. Well appearing and in no distress. Eyes: Normal exam ENT   Head: Normocephalic and atraumatic.   Mouth/Throat: Mucous membranes are moist. Cardiovascular: Normal rate, regular rhythm. No murmur Respiratory: Normal respiratory effort without tachypnea nor retractions. Breath sounds are clear  Gastrointestinal: Soft and nontender. No distention.  Musculoskeletal: Nontender with normal range of motion in all extremities Neurologic:  Normal speech and language. No gross focal neurologic deficits are appreciated. Skin:  Skin is warm, dry and intact.  Psychiatric: Mood and affect are normal. Speech and behavior are normal.   ____________________________________________    EKG  EKG reviewed and interpreted by myself shows normal sinus rhythm at 86 bpm, narrow QRS, normal axis, normal intervals, mild ST changes without ST elevation.  ____________________________________________    INITIAL IMPRESSION / ASSESSMENT AND PLAN / ED COURSE  Pertinent labs & imaging results that were available during my care of the patient were reviewed by me and considered in my medical decision making (see chart for details).  Patient presents the emergency department after an intentional  overdose. Patient admits taking the pills to try to hurt herself. We will check labs, and monitor in the emergency department. Once medically cleared patient will be seen by psychiatry. I placed the patient under an involuntary commitment.  Patient's labs are largely within normal limits. Patient will desat into the 80s while sleeping with a good waveform. We have placed the patient 2 L via nasal cannula. We will continue to monitor medically, once patient is medically cleared she'll need psychiatric evaluation.  Patient care signed out to Dr. Huel Cote, psychiatric evaluation pending.  ____________________________________________   FINAL CLINICAL IMPRESSION(S) / ED DIAGNOSES  Intentional overdose    Minna Antis, MD 09/27/15 [phone]

## 2015-09-27 NOTE — ED Provider Notes (Signed)
-----------------------------------------   6:15 PM on 09/27/2015 -----------------------------------------   Blood pressure 112/60, pulse 72, temperature 99.3 F (37.4 C), temperature source Oral, resp. rate 16, height 5\' 5"  (1.651 m), weight 155 lb (70.3 kg), SpO2 98 %.  Assuming care from Dr. Gerlean Ren.  In short, Patricia [name] is a 55 y.o. female with a chief complaint of Drug Overdose .  Refer to the original H&P for additional details.  The current plan of care is to *observe the patient. Patient's currently able to eat and drink and is been ambulatory without assistance. Patient's involuntarily committed and has scheduled consultation with TTS services along with psychiatric services. Her dose was described as being intentional although she seems to be regretting the overdose at this point. She is medically cleared and I felt could be moved to the BHU area    Jennye Moccasin, MD 09/27/15 [phone]

## 2015-09-27 NOTE — ED Notes (Signed)
Pt. Alert and oriented, warm and dry, in no distress. Pt. Denies SI, HI, and AVH. Pt complained of  Nausea and asked for ginger ale. This writer advised patient to let me know if she continues to have nausea. Pt. Encouraged to let nursing staff know of any concerns or needs.

## 2015-09-27 NOTE — ED Notes (Signed)

## 2015-09-28 ENCOUNTER — Inpatient Hospital Stay
Admission: EM | Admit: 2015-09-28 | Discharge: 2015-09-30 | DRG: 885 | Disposition: A | Discharge status: Home / Self Care | Payer: BLUE CROSS/BLUE SHIELD | Source: Intra-hospital | Attending: Psychiatry | Admitting: Psychiatry

## 2015-09-28 DIAGNOSIS — F332 Major depressive disorder, recurrent severe without psychotic features: Secondary | ICD-10-CM | POA: Diagnosis present

## 2015-09-28 DIAGNOSIS — R45851 Suicidal ideations: Secondary | ICD-10-CM | POA: Diagnosis present

## 2015-09-28 DIAGNOSIS — Z818 Family history of other mental and behavioral disorders: Secondary | ICD-10-CM | POA: Diagnosis not present

## 2015-09-28 DIAGNOSIS — Z803 Family history of malignant neoplasm of breast: Secondary | ICD-10-CM

## 2015-09-28 DIAGNOSIS — E785 Hyperlipidemia, unspecified: Secondary | ICD-10-CM | POA: Diagnosis present

## 2015-09-28 DIAGNOSIS — Z87891 Personal history of nicotine dependence: Secondary | ICD-10-CM

## 2015-09-28 DIAGNOSIS — Z9889 Other specified postprocedural states: Secondary | ICD-10-CM | POA: Diagnosis not present

## 2015-09-28 DIAGNOSIS — T40601A Poisoning by unspecified narcotics, accidental (unintentional), initial encounter: Secondary | ICD-10-CM

## 2015-09-28 DIAGNOSIS — Z915 Personal history of self-harm: Secondary | ICD-10-CM

## 2015-09-28 DIAGNOSIS — F410 Panic disorder [episodic paroxysmal anxiety] without agoraphobia: Secondary | ICD-10-CM | POA: Diagnosis present

## 2015-09-28 DIAGNOSIS — Z90710 Acquired absence of both cervix and uterus: Secondary | ICD-10-CM | POA: Diagnosis not present

## 2015-09-28 DIAGNOSIS — Z79899 Other long term (current) drug therapy: Secondary | ICD-10-CM

## 2015-09-28 DIAGNOSIS — F1590 Other stimulant use, unspecified, uncomplicated: Secondary | ICD-10-CM

## 2015-09-28 DIAGNOSIS — F1120 Opioid dependence, uncomplicated: Secondary | ICD-10-CM | POA: Diagnosis present

## 2015-09-28 DIAGNOSIS — G4700 Insomnia, unspecified: Secondary | ICD-10-CM | POA: Diagnosis present

## 2015-09-28 DIAGNOSIS — Z880 Allergy status to penicillin: Secondary | ICD-10-CM

## 2015-09-28 DIAGNOSIS — Z8249 Family history of ischemic heart disease and other diseases of the circulatory system: Secondary | ICD-10-CM | POA: Diagnosis not present

## 2015-09-28 DIAGNOSIS — F4010 Social phobia, unspecified: Secondary | ICD-10-CM | POA: Diagnosis present

## 2015-09-28 DIAGNOSIS — Z811 Family history of alcohol abuse and dependence: Secondary | ICD-10-CM

## 2015-09-28 DIAGNOSIS — T426X2A Poisoning by other antiepileptic and sedative-hypnotic drugs, intentional self-harm, initial encounter: Secondary | ICD-10-CM | POA: Diagnosis not present

## 2015-09-28 LAB — TSH: TSH: 1.029 u[IU]/mL (ref 0.350–4.500)

## 2015-09-28 MED ORDER — ALBUTEROL SULFATE HFA 108 (90 BASE) MCG/ACT IN AERS
2.0000 | INHALATION_SPRAY | RESPIRATORY_TRACT | Status: DC | PRN
  Filled 2015-09-28: qty 6.7

## 2015-09-28 MED ORDER — FLUOXETINE HCL 20 MG PO CAPS
80.0000 mg | ORAL_CAPSULE | Freq: Every day | ORAL | Status: DC
  Administered 2015-09-29 – 2015-09-30 (×2): 80 mg via ORAL
  Filled 2015-09-28 (×2): qty 4

## 2015-09-28 MED ORDER — ACETAMINOPHEN 325 MG PO TABS: 650.0000 mg | ORAL_TABLET | Freq: Four times a day (QID) | ORAL | Status: DC | PRN

## 2015-09-28 MED ORDER — ATORVASTATIN CALCIUM 20 MG PO TABS
20.0000 mg | ORAL_TABLET | Freq: Every day | ORAL | Status: DC
  Administered 2015-09-28 – 2015-09-29 (×2): 20 mg via ORAL
  Filled 2015-09-28 (×2): qty 1

## 2015-09-28 MED ORDER — FLUOXETINE HCL 20 MG PO CAPS
80.0000 mg | ORAL_CAPSULE | Freq: Every day | ORAL | Status: DC
  Administered 2015-09-28: 80 mg via ORAL

## 2015-09-28 MED ORDER — LAMOTRIGINE 100 MG PO TABS
200.0000 mg | ORAL_TABLET | Freq: Every day | ORAL | Status: DC
  Administered 2015-09-28: 200 mg via ORAL

## 2015-09-28 MED ORDER — LAMOTRIGINE 100 MG PO TABS
200.0000 mg | ORAL_TABLET | Freq: Every day | ORAL | Status: DC
  Administered 2015-09-29 – 2015-09-30 (×2): 200 mg via ORAL
  Filled 2015-09-28 (×2): qty 2

## 2015-09-28 MED ORDER — LAMOTRIGINE 100 MG PO TABS
ORAL_TABLET | ORAL | Status: AC
  Administered 2015-09-28: 200 mg via ORAL
  Filled 2015-09-28: qty 2

## 2015-09-28 MED ORDER — ALUM & MAG HYDROXIDE-SIMETH 200-200-20 MG/5ML PO SUSP: 30.0000 mL | ORAL | Status: DC | PRN

## 2015-09-28 MED ORDER — MAGNESIUM HYDROXIDE 400 MG/5ML PO SUSP: 30.0000 mL | Freq: Every day | ORAL | Status: DC | PRN

## 2015-09-28 MED ORDER — TRAZODONE HCL 100 MG PO TABS
200.0000 mg | ORAL_TABLET | Freq: Every day | ORAL | Status: DC
  Administered 2015-09-28: 200 mg via ORAL
  Filled 2015-09-28: qty 2

## 2015-09-28 MED ORDER — TRAZODONE HCL 100 MG PO TABS: 200.0000 mg | ORAL_TABLET | Freq: Every day | ORAL | Status: DC

## 2015-09-28 MED ORDER — FLUOXETINE HCL 20 MG PO CAPS
ORAL_CAPSULE | ORAL | Status: AC
  Administered 2015-09-28: 80 mg via ORAL
  Filled 2015-09-28: qty 4

## 2015-09-28 MED ORDER — ATORVASTATIN CALCIUM 20 MG PO TABS: 20.0000 mg | ORAL_TABLET | Freq: Every day | ORAL | Status: DC

## 2015-09-28 NOTE — ED Notes (Signed)
Patient transferred to [facility] for inpatient treatment. All personal belongings sent with patient. 

## 2015-09-28 NOTE — ED Notes (Signed)
Report given to Andrea, RN.

## 2015-09-28 NOTE — ED Notes (Signed)
Patient asleep in room. No noted distress or abnormal behavior. Will continue 15 minute checks and observation by security cameras for safety. 

## 2015-09-28 NOTE — Plan of Care (Signed)
Problem: Safety: Goal: Periods of time without injury will increase Outcome: Progressing Pt remains free from harm.  Problem: Medication: Goal: Compliance with prescribed medication regimen will improve Outcome: Progressing Pt takes medications as prescribed and asks questions appropriately.

## 2015-09-28 NOTE — ED Notes (Signed)
Patient resting quietly in room. No noted distress or abnormal behaviors noted. Will continue 15 minute checks and observation by security camera for safety. 

## 2015-09-28 NOTE — Progress Notes (Signed)
Patient ID: Patricia [name], female   DOB: [birth year], 55 y.o.   MRN: [id number]  Pt is admitted to unit from [facility] ED after (per the husband) the patient took 1 or 2 of his pain medications and then she began having trouble breathing, Husband believes it was an accident and not that the patient had not intent on ending her life, pt denies SI/HI/AVH upon assessment, flat/anxious during interaction, pt states that she is HOH in both ears and has trouble seeing without her bifocals on--writer had patient call her husband to bring in her glasses, skin/contraband search done, skin intact--pt has a few superficial scratches to her upper back, no contraband found, oriented to unit and rules, pt was cooperative throughout the admission process and has no questions at this time, ordered and gave patient a dinner tray, patient had no belongings with her at the time of admission but did speak with her husband about bringing her some clothing when he comes to visit.

## 2015-09-28 NOTE — ED Notes (Signed)
Patient visited with husband. Meal tray given to patient.

## 2015-09-28 NOTE — Tx Team (Signed)
Initial Interdisciplinary Treatment Plan   PATIENT STRESSORS: Medication change or noncompliance Substance abuse   PATIENT STRENGTHS: Ability for insight Capable of independent living Communication skills General fund of knowledge Supportive family/friends   PROBLEM LIST: Problem List/Patient Goals Date to be addressed Date deferred Reason deferred Estimated date of resolution  Anxiety      Substance Abuse      Depressed Mood      Suicide Risk                                     DISCHARGE CRITERIA:  Improved stabilization in mood, thinking, and/or behavior Motivation to continue treatment in a less acute level of care Need for constant or close observation no longer present Verbal commitment to aftercare and medication compliance  PRELIMINARY DISCHARGE PLAN: Attend aftercare/continuing care group Outpatient therapy Participate in family therapy Return to previous living arrangement  PATIENT/FAMIILY INVOLVEMENT: This treatment plan has been presented to and reviewed with the patient, Patricia [name].  The patient and family have been given the opportunity to ask questions and make suggestions.  Lauris Poag 09/28/2015, 6:49 PM

## 2015-09-28 NOTE — Progress Notes (Signed)
Patient is to be admitted to [facility] by Dr. Toni Amend.  Attending Physician will be Dr. Jennet Maduro.   Patient has been assigned to room 309, by [facility] Charge Nurse Marylu Lund.   Intake Paper Work has been signed and placed on patient chart.  ER staff is aware of the admission ( [facility].; Dr. Don Perking, ER MD; Amy, Patient's Nurse & [facility] Patient Access). Hedy Garro K. Sherlon Handing, LPC-A, [facility]  Counselor 09/28/2015 4:18 PM

## 2015-09-28 NOTE — ED Notes (Signed)

## 2015-09-28 NOTE — ED Notes (Signed)
Patient took a shower. She is currently in no acute distress. Maintained on 15 minute checks and observation by security camera for safety.

## 2015-09-28 NOTE — ED Provider Notes (Signed)
-----------------------------------------   7:56 AM on 09/28/2015 -----------------------------------------   Blood pressure 120/64, pulse 72, temperature 99.1 F (37.3 C), temperature source Oral, resp. rate 16, height 5\' 5"  (1.651 m), weight 155 lb (70.3 kg), SpO2 100 %.  The patient had no acute events since last update.  Treating for alcohol withdrawal. Psychiatry will see this morning to decide upon disposition. No acute events overnight.    Minna Antis, MD 09/28/15 [phone]

## 2015-09-28 NOTE — Consult Note (Signed)
Patricia [name]   Reason for [name]:  [name] for this 55 year old woman brought into the hospital after taking an overdose of multiple medications Referring Physician:  Marcelene Butte [name] MRN:  [id number] Principal Diagnosis: Recurrent major depressive disorder [facility]) Diagnosis:   Patient Active Problem List   Diagnosis Date Noted  . Opiate overdose [T40.601A] 09/28/2015  . Amphetamine abuse [F15.10] 09/28/2015  . Involuntary commitment [Z04.6] 09/28/2015  . Anxiety disorder [F41.9] 05/01/2015  . Recurrent major depressive disorder ([facility]) [F33.9] 05/01/2015  . Ataxia [R27.0] 02/06/2015  . Allergic rhinitis [J30.9] 03/04/2014  . H/O infectious disease [Z86.19] 01/14/2014  . 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 Abnormal toxicological findings [R89.2] 10/22/2013  . ADD (attention deficit disorder) [F90.9] 09/19/2013  . Anxiety [F41.9] 09/19/2013  . Clinical depression [F32.9] 09/19/2013  . H/O eating disorder [Z86.59] 09/19/2013  . H/O alcohol abuse [F10.10] 09/19/2013  . Arterial blood pressure decreased [I95.9] 09/19/2013  . Respiratory failure ([place]) [J96.90] 09/19/2013  . Fast heart beat [R00.0] 09/19/2013    Total Time spent with patient: 1 hour  Subjective:   Patricia [name] is a 55 y.o. female patient admitted with "I took those medicines to sleep".  HPI:  Patient interviewed. Chart reviewed. Labs and vitals reviewed. 55 year old woman brought into the hospital last night after taking an overdose of medications. Patient states that she took some of her husband's pills. Initially she says she just took a couple of them hoping to sleep but then admits that she swallowed the entire bottle. She doesn't even know what the pills exactly work. Husband then found her sleeping and found the empty bottles and called 911. Patient says that she has been more depressed and anxious and down recently but only for  the last couple days. Hasn't been able to sleep well for the last couple days. Also says that she's been feeling much more angry. Patient denies that she actually wanted to kill her self although it sounds like there was more indication of suicidality when she came in last night. She says that she has been taking her medicines prescribed by her previous psychiatrist but is no longer seeing the psychiatrist. She does see a therapist regularly. She says that her biggest stress is just her life around the house and her son having recently moved home again.  Social history: Patient is not working outside the home. Lives with her husband and adult son and his girlfriend. Patient has not been working in about a year. There is some tension around the house that she doesn't go into details about.  Medical history: Dyslipidemia. Otherwise denies any significant ongoing medical problems.  Substance abuse history: Patient has a history of alcohol abuse but says that she has been sober for 14 years. She denies other drug abuse. Her drug screen is positive for amphetamines and opiates.  Past Psychiatric History: Patient has been seen by psychiatrist intermittently for years and has had suicidal thoughts and behaviors in the past. She's had a psychiatric hospitalization at least one time at [facility] a couple of years ago. She says she has been on her current medicine for a couple of years but recently stopped going to her psychiatrist. Apparently she was seeing Dr. Heath Lark in our outpatient clinic who had her stop taking her Xanax recently.  Risk to Self: Suicidal Ideation:  (Unknown, patient didn't answer) Suicidal Intent:  (Unknown, patient didn't answer) Is patient at risk for suicide?: Yes (Due to poor  impulse control and judgment) Suicidal Plan?:  (Unknown, patient didn't answer) Access to Means: Yes Specify Access to Suicidal Means: Medications What has been your use of drugs/alcohol within the last 12  months?: Alcohol How many times?:  (Unknown, patient didn't answer and husband doesn't know) Other Self Harm Risks: Reports none Triggers for Past Attempts: Unknown Intentional Self Injurious Behavior: None Risk to Others: Homicidal Ideation: No Thoughts of Harm to Others: No Current Homicidal Intent: No Current Homicidal Plan: No Access to Homicidal Means: No Identified Victim: Reports of none History of harm to others?: No Assessment of Violence: None Noted Violent Behavior Description: Reports of none Criminal Charges Pending?: No Does patient have a court date: No Prior Inpatient Therapy: Prior Inpatient Therapy: Yes Prior Therapy Dates: [place] Prior Therapy Facilty/Provider(s): 2012,  Prior Outpatient Therapy: Prior Outpatient Therapy: Yes Prior Therapy Dates: Current Prior Therapy Facilty/Provider(s): [facility]  Reason for Treatment: Depression and alcohol use Does patient have an ACCT team?: No Does patient have Intensive In-House Services?  : No Does patient have [facility] services? : No Does patient have P4CC services?: No  Past Medical History:  Past Medical History:  Diagnosis Date  . Alcohol abuse unk  . Anxiety unk  . Aspiration into airway   . Depression unk    Past Surgical History:  Procedure Laterality Date  . ABDOMINAL HYSTERECTOMY    . FOOT SURGERY    . TRACHEAL SURGERY     Family History:  Family History  Problem Relation Age of Onset  . Breast cancer Mother   . Hypertension Mother   . Depression Mother   . Anxiety disorder Mother   . Alcohol abuse Father   . Alcohol abuse Sister   . Anxiety disorder Sister   . Bipolar disorder Sister    Family Psychiatric  History: Patient says her mother had depression Social History:  History  Alcohol Use No    Comment: former alcoholic "sober for a month now"     History  Drug Use No    Social History   Social History  . Marital status: Married    Spouse name: N/A  .  Number of children: N/A  . Years of education: N/A   Social History Main Topics  . Smoking status: Former Smoker    Types: Cigarettes  . Smokeless tobacco: Never Used  . Alcohol use No     Comment: former alcoholic "sober for a month now"  . Drug use: No  . Sexual activity: Not Currently   Other Topics Concern  . None   Social History Narrative  . None   Additional Social History:    Allergies:   Allergies  Allergen Reactions  . Penicillins Hives    Has patient had a PCN reaction causing immediate rash, facial/tongue/throat swelling, SOB or lightheadedness with hypotension: Yes Has patient had a PCN reaction causing severe rash involving mucus membranes or skin necrosis: No Has patient had a PCN reaction that required hospitalization Yes Has patient had a PCN reaction occurring within the last 10 years: Yes If all of the above answers are "NO", then may proceed with Cephalosporin use.     Labs:  Results for orders placed or performed during the hospital encounter of 09/27/15 (from the past 48 hour(s))  Comprehensive metabolic panel     Status: Abnormal   Collection Time: 09/27/15  6:49 AM  Result Value Ref Range   Sodium 137 135 - 145 mmol/L   Potassium 4.2  3.5 - 5.1 mmol/L   Chloride 100 (L) 101 - 111 mmol/L   CO2 24 22 - 32 mmol/L   Glucose, Bld 124 (H) 65 - 99 mg/dL   BUN 14 6 - 20 mg/dL   Creatinine, Ser 0.84 0.44 - 1.00 mg/dL   Calcium 9.4 8.9 - 10.3 mg/dL   Total Protein 7.0 6.5 - 8.1 g/dL   Albumin 4.2 3.5 - 5.0 g/dL   AST 33 15 - 41 U/L   ALT 20 14 - 54 U/L   Alkaline Phosphatase 75 38 - 126 U/L   Total Bilirubin 0.7 0.3 - 1.2 mg/dL   GFR calc non Af Amer >60 >60 mL/min   GFR calc Af Amer >60 >60 mL/min    Comment: (NOTE) The eGFR has been calculated using the CKD EPI equation. This calculation has not been validated in all clinical situations. eGFR's persistently <60 mL/min signify possible Chronic Kidney Disease.    Anion gap 13 5 - 15  Ethanol      Status: None   Collection Time: 09/27/15  6:49 AM  Result Value Ref Range   Alcohol, Ethyl (B) <5 <5 mg/dL    Comment:        LOWEST DETECTABLE LIMIT FOR SERUM ALCOHOL IS 5 mg/dL FOR MEDICAL PURPOSES ONLY   Salicylate level     Status: None   Collection Time: 09/27/15  6:49 AM  Result Value Ref Range   Salicylate Lvl <4.3 2.8 - 30.0 mg/dL  Acetaminophen level     Status: Abnormal   Collection Time: 09/27/15  6:49 AM  Result Value Ref Range   Acetaminophen (Tylenol), Serum <10 (L) 10 - 30 ug/mL    Comment:        THERAPEUTIC CONCENTRATIONS VARY SIGNIFICANTLY. A RANGE OF 10-30 ug/mL MAY BE AN EFFECTIVE CONCENTRATION FOR MANY PATIENTS. HOWEVER, SOME ARE BEST TREATED AT CONCENTRATIONS OUTSIDE THIS RANGE. ACETAMINOPHEN CONCENTRATIONS >150 ug/mL AT 4 HOURS AFTER INGESTION AND >50 ug/mL AT 12 HOURS AFTER INGESTION ARE OFTEN ASSOCIATED WITH TOXIC REACTIONS.   cbc     Status: None   Collection Time: 09/27/15  6:49 AM  Result Value Ref Range   WBC 8.6 3.6 - 11.0 K/uL   RBC 4.34 3.80 - 5.20 MIL/uL   Hemoglobin 13.5 12.0 - 16.0 g/dL   HCT 39.2 35.0 - 47.0 %   MCV 90.3 80.0 - 100.0 fL   MCH 31.1 26.0 - 34.0 pg   MCHC 34.5 32.0 - 36.0 g/dL   RDW 14.0 11.5 - 14.5 %   Platelets 202 150 - 440 K/uL  Urine Drug Screen, Qualitative     Status: Abnormal   Collection Time: 09/27/15  7:17 AM  Result Value Ref Range   Tricyclic, Ur Screen NONE DETECTED NONE DETECTED   Amphetamines, Ur Screen POSITIVE (A) NONE DETECTED   MDMA (Ecstasy)Ur Screen NONE DETECTED NONE DETECTED   Cocaine Metabolite,Ur [place] NONE DETECTED NONE DETECTED   Opiate, Ur Screen POSITIVE (A) NONE DETECTED   Phencyclidine (PCP) Ur S NONE DETECTED NONE DETECTED   Cannabinoid 50 Ng, Ur [place] NONE DETECTED NONE DETECTED   Barbiturates, Ur Screen NONE DETECTED NONE DETECTED   Benzodiazepine, Ur Scrn NONE DETECTED NONE DETECTED   Methadone Scn, Ur NONE DETECTED NONE DETECTED    Comment: (NOTE) 568  Tricyclics, urine                Cutoff 1000 ng/mL 200  Amphetamines, urine  Cutoff 1000 ng/mL 300  MDMA (Ecstasy), urine           Cutoff 500 ng/mL 400  Cocaine Metabolite, urine       Cutoff 300 ng/mL 500  Opiate, urine                   Cutoff 300 ng/mL 600  Phencyclidine (PCP), urine      Cutoff 25 ng/mL 700  Cannabinoid, urine              Cutoff 50 ng/mL 800  Barbiturates, urine             Cutoff 200 ng/mL 900  Benzodiazepine, urine           Cutoff 200 ng/mL 1000 Methadone, urine                Cutoff 300 ng/mL 1100 1200 The urine drug screen provides only a preliminary, unconfirmed 1300 analytical test result and should not be used for non-medical 1400 purposes. Clinical consideration and professional judgment should 1500 be applied to any positive drug screen result due to possible 1600 interfering substances. A more specific alternate chemical method 1700 must be used in order to obtain a confirmed analytical result.  1800 Gas chromato graphy / mass spectrometry (GC/MS) is the preferred 1900 confirmatory method.     Current Facility-Administered Medications  Medication Dose Route Frequency Provider Last Rate Last Dose  . albuterol (PROVENTIL HFA;VENTOLIN HFA) 108 (90 Base) MCG/ACT inhaler 2 puff  2 puff Inhalation Q4H PRN Gonzella Lex, MD      . atorvastatin (LIPITOR) tablet 20 mg  20 mg Oral q1800 Gonzella Lex, MD      . FLUoxetine (PROZAC) capsule 80 mg  80 mg Oral Daily Gonzella Lex, MD   80 mg at 09/28/15 1316  . lamoTRIgine (LAMICTAL) tablet 200 mg  200 mg Oral Daily Gonzella Lex, MD   200 mg at 09/28/15 1317  . traZODone (DESYREL) tablet 200 mg  200 mg Oral QHS Gonzella Lex, MD       Current Outpatient Prescriptions  Medication Sig Dispense Refill  . albuterol (VENTOLIN HFA) 108 (90 BASE) MCG/ACT inhaler Inhale 2 puffs into the lungs every 6 (six) hours as needed for wheezing or shortness of breath.     . ALPRAZolam (XANAX) 1 MG tablet Take 1 mg by mouth 2 (two) times  daily as needed for anxiety.    Marland Kitchen atorvastatin (LIPITOR) 40 MG tablet Take 1 tablet (40 mg total) by mouth daily at 6 PM. 30 tablet 0  . folic acid (FOLVITE) 1 MG tablet Take 1 tablet (1 mg total) by mouth daily. 30 tablet 0  . lamoTRIgine (LAMICTAL) 200 MG tablet Take 1 tablet (200 mg total) by mouth daily. 90 tablet 1  . Multiple Vitamin (MULTIVITAMIN WITH MINERALS) TABS tablet Take 1 tablet by mouth daily. 30 tablet 0  . thiamine (VITAMIN B-1) 100 MG tablet Take 1 tablet (100 mg total) by mouth daily. 30 tablet 0  . traZODone (DESYREL) 100 MG tablet Take 2 tablets (200 mg total) by mouth at bedtime. 60 tablet 2    Musculoskeletal: Strength & Muscle Tone: within normal limits Gait & Station: normal Patient leans: N/A  Psychiatric Specialty Exam: Physical Exam  Nursing note and vitals reviewed. Constitutional: She appears well-developed and well-nourished.  HENT:  Head: Normocephalic and atraumatic.  Eyes: Conjunctivae are normal. Pupils are equal, round, and reactive to light.  Neck: Normal  range of motion.  Cardiovascular: Regular rhythm and normal heart sounds.   Respiratory: Effort normal. No respiratory distress.  GI: Soft.  Musculoskeletal: Normal range of motion.  Neurological: She is alert.  Skin: Skin is warm and dry.  Psychiatric: Her mood appears anxious. Her speech is delayed. She is slowed. She expresses impulsivity. She expresses no suicidal plans. She exhibits abnormal recent memory.    Review of Systems  Constitutional: Negative.   HENT: Negative.   Eyes: Negative.   Respiratory: Negative.   Cardiovascular: Negative.   Gastrointestinal: Negative.   Musculoskeletal: Negative.   Skin: Negative.   Neurological: Negative.   Psychiatric/Behavioral: Positive for depression, memory loss and substance abuse. Negative for hallucinations and suicidal ideas. The patient is nervous/anxious and has insomnia.     Blood pressure 119/75, pulse 74, temperature 98.5 F (36.9  C), temperature source Oral, resp. rate 18, height _0  (1.651 m), weight 70.3 kg (155 lb), SpO2 100 %.Body mass index is 25.79 kg/m.  General Appearance: Casual  Eye Contact:  Fair  Speech:  Slow  Volume:  Decreased  Mood:  Dysphoric  Affect:  Depressed  Thought Process:  Goal Directed  Orientation:  Full (Time, Place, and Person)  Thought Content:  Rumination and Tangential  Suicidal Thoughts:  No  Homicidal Thoughts:  No  Memory:  Immediate;   Good Recent;   Fair Remote;   Poor  Judgement:  Impaired  Insight:  Lacking  Psychomotor Activity:  Decreased  Concentration:  Concentration: Fair  Recall:  [employer] of Knowledge:  Fair  Language:  Fair  Akathisia:  Negative  Handed:  Right  AIMS (if indicated):     Assets:  Housing Physical Health Resilience  ADL's:  Intact  Cognition:  WNL  Sleep:        Treatment Plan Summary: Daily contact with patient to assess and evaluate symptoms and progress in treatment, Medication management and Plan 55 year old woman who took an overdose of multiple medications impulsively. Recent symptoms of depression and anxiety and anger. Drug screen positive for opiates and amphetamines. Not really compliant with appropriate outpatient treatment. Patient needs inpatient hospitalization because of potential for suicidal behavior. A full commitment. She will be admitted to the psychiatric ward and continued on her lamotrigine Prozac and trazodone.  Disposition: Recommend psychiatric Inpatient admission when medically cleared.  Alethia Berthold, MD 09/28/2015 3:11 PM

## 2015-09-29 DIAGNOSIS — F332 Major depressive disorder, recurrent severe without psychotic features: Principal | ICD-10-CM

## 2015-09-29 MED ORDER — QUETIAPINE FUMARATE 100 MG PO TABS: 100.0000 mg | ORAL_TABLET | Freq: Every day | ORAL | 1 refills | Status: DC

## 2015-09-29 MED ORDER — TRAZODONE HCL 100 MG PO TABS: 200.0000 mg | ORAL_TABLET | Freq: Every evening | ORAL | Status: DC | PRN

## 2015-09-29 MED ORDER — LAMOTRIGINE 200 MG PO TABS: 200.0000 mg | ORAL_TABLET | Freq: Every day | ORAL | 1 refills | Status: DC

## 2015-09-29 MED ORDER — FLUOXETINE HCL 40 MG PO CAPS: 80.0000 mg | ORAL_CAPSULE | Freq: Every day | ORAL | 1 refills | Status: DC

## 2015-09-29 MED ORDER — QUETIAPINE FUMARATE 100 MG PO TABS
100.0000 mg | ORAL_TABLET | Freq: Every day | ORAL | Status: DC
  Administered 2015-09-29: 100 mg via ORAL
  Filled 2015-09-29: qty 1

## 2015-09-29 NOTE — [facility] Suicide Risk Assessment (Signed)
[facility] Admission Suicide Risk Assessment   Nursing information obtained from:    Demographic factors:    Current Mental Status:    Loss Factors:    Historical Factors:    Risk Reduction Factors:     Total Time spent with patient: 1 hour Principal Problem: Major depressive disorder, recurrent severe without psychotic features (HCC) Diagnosis:   Patient Active Problem List   Diagnosis Date Noted  . Major depressive disorder, recurrent severe without psychotic features (HCC) [F33.2] 09/28/2015    Priority: High  . Opiate overdose [T40.601A] 09/28/2015  . 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 Involuntary commitment [Z04.6] 09/28/2015  . Opioid use disorder, moderate, dependence (HCC) [F11.20] 09/28/2015  . Anxiety disorder [F41.9] 05/01/2015  . Allergic rhinitis [J30.9] 03/04/2014  . 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 Abnormal toxicological findings [R89.2] 10/22/2013  . ADD (attention deficit disorder) [F90.9] 09/19/2013  . H/O eating disorder [Z86.59] 09/19/2013  . H/O alcohol abuse [F10.10] 09/19/2013   Subjective Data: Depression, anxiety, mood instability, substance abuse, suicide attempt.  Continued Clinical Symptoms:  Alcohol Use Disorder Identification Test Final Score (AUDIT): 2 The "Alcohol Use Disorders Identification Test", Guidelines for Use in Primary Care, Second Edition.  World Science writer [facility]). Score between 0-7:  no or low risk or alcohol related problems. Score between 8-15:  moderate risk of alcohol related problems. Score between 16-19:  high risk of alcohol related problems. Score 20 or above:  warrants further diagnostic evaluation for alcohol dependence and treatment.   CLINICAL FACTORS:   Severe Anxiety and/or Agitation Depression:   Comorbid alcohol abuse/dependence Impulsivity Insomnia Alcohol/Substance Abuse/Dependencies   Musculoskeletal: Strength & Muscle Tone: within normal limits Gait  & Station: normal Patient leans: N/A  Psychiatric Specialty Exam: Physical Exam  Nursing note and vitals reviewed.   Review of Systems  Psychiatric/Behavioral: Positive for depression and substance abuse. The patient is nervous/anxious and has insomnia.   All other systems reviewed and are negative.   Blood pressure 95/78, pulse 86, temperature 98 F (36.7 C), temperature source Oral, resp. rate 20, height  (1.651 m), weight 64 kg (141 lb), SpO2 98 %.Body mass index is 23.46 kg/m.  General Appearance: Casual  Eye Contact:  Good  Speech:  Clear and Coherent  Volume:  Normal  Mood:  Anxious  Affect:  Appropriate  Thought Process:  Goal Directed  Orientation:  Full (Time, Place, and Person)  Thought Content:  WDL  Suicidal Thoughts:  No  Homicidal Thoughts:  No  Memory:  Immediate;   Fair Recent;   Fair Remote;   Fair  Judgement:  Poor  Insight:  Shallow  Psychomotor Activity:  Normal  Concentration:  Concentration: Fair and Attention Span: Fair  Recall:  [employer] of Knowledge:  Fair  Language:  Fair  Akathisia:  No  Handed:  Right  AIMS (if indicated):     Assets:  Communication Skills Desire for Improvement Financial Resources/Insurance Housing Intimacy Physical Health Resilience Social Support Transportation  ADL's:  Intact  Cognition:  WNL  Sleep:  Number of Hours: 9      COGNITIVE FEATURES THAT CONTRIBUTE TO RISK:  None    SUICIDE RISK:   Minimal: No identifiable suicidal ideation.  Patients presenting with no risk factors but with morbid ruminations; may be classified as minimal risk based on the severity of the depressive symptoms   PLAN OF CARE: Hospital admission, medication management, substance abuse counseling, discharge planning.  Patricia [name] is a  55 year old female with a history of depression, anxiety, mood instability, substance use, and suicide attempts admitted for worsening of depression and another suicide attempt with overdose on  narcotic painkillers.  1. Suicidal ideation. The patient is able to contract for safety in the hospital.  2. Mood and anxiety. We will continue Prozac 80 mg daily and Lamictal 200 mg nightly for depression and mood stabilization as in the community.  3. Insomnia. We will start Seroquel tonight for further mood stabilization.  4. Substance abuse. The patient was positive for narcotic pain killers and stimulants. She admits that she uses her husband's medication. She minimizes her problems and declines substance abuse treatment. She has a therapist in [place]. She assures me that she has been discusses substance abuse problems with the counselor.   5. Dyslipidemia. She is on Lipitor.  6. Metabolic syndrome monitoring. Lipid profile, TSH, hemoglobin A1c and prolactin are pending.  7. Disposition. She will be discharged to home with her husband. She will follow up with [facility] for medication management in [facility] for therapy.  I certify that inpatient services furnished can reasonably be expected to improve the patient's condition.  Kristine Linea, MD 09/29/2015, 10:12 AM

## 2015-09-29 NOTE — Progress Notes (Signed)
Recreation Therapy Notes  Date: 08.01.17 Time: 9:30 am Location: Craft Room  Group Topic: Self-expression  Goal Area(s) Addresses:  Patient will identify one color per emotion listed on wheel. Patient will verbalize benefit of using art as a means of self-expression. Patient will verbalize one emotion experienced during session. Patient will be educated on other forms of self-expression.  Behavioral Response: Did not attend  Intervention: Emotion Wheel  Activity: Patients were given an Emotion Wheel worksheet and instructed to pick a color for each emotion that was listed on the wheel.  Education: LRT educated patients on other forms of self-expression.  Education Outcome: Patient did not attend group.  Clinical Observations/Feedback: Patient did not attend group.  Jacquelynn Cree, LRT/CTRS 09/29/2015 10:15 AM

## 2015-09-29 NOTE — Progress Notes (Signed)
Patient noted with a flat, depressed affect. She remained isolated to her room but attended groups. She stated that she is depressed, she also stated that when she overdosed, she was not actually intending to kill herself. "I knew I didn't want to die but..." Patient guarded in thought but pleasant. Patient denied thoughts of self harm and contracted for safety. She also denied AVH/HI. Patient was encouraged to verbalize feelings/thoughts/concerns to nursing staff. She verbalized understanding.

## 2015-09-29 NOTE — Progress Notes (Signed)
Ms Patricia [name] was in the Day Room watching TV with peers, receptive when she was called aside to talk with me to maintain confidentiality. "I was depressed and I took some of my husband's medications; I did not take it to commit suicide, I just want to sleep for ever ..." Patient is inconsistent with content of thoughts. Mood and affect pleasant and appropriate; will monmitor.

## 2015-09-29 NOTE — Progress Notes (Signed)
D: Pt remains isolative in her room this evening. She presents with a flat and depressed affect. Pt is guarded with interaction and forwards little information. Pt currently rates depression 9/10 and anxiety 6/10. Denies SI/HI/AVH at this time. Pt c/o h/a which she rates 8/10. She refuses PRN medication for pain. A: Emotional support and encouragement provided. Medications administered with education. q15 minute safety checks maintained. R: Pt remains free from harm. Will continue to monitor.

## 2015-09-29 NOTE — Tx Team (Signed)
Interdisciplinary Treatment Plan Update (Adult)  Date:  09/29/2015 Time Reviewed:  2:43 PM  Progress in Treatment: Attending groups: No. Participating in groups:  No. Taking medication as prescribed:  Yes. Tolerating medication:  Yes. Family/Significant othe contact made:  No, will contact:    Patient understands diagnosis:  Yes. Discussing patient identified problems/goals with staff:  Yes. Medical problems stabilized or resolved:  Yes. Denies suicidal/homicidal ideation: Yes. Issues/concerns per patient self-inventory:  No. Other:  New problem(s) identified: No, Describe:     Discharge Plan or Barriers: Discharge Home with recommended Medication Management at [facility] and therapy at [facility]  Reason for Continuation of Hospitalization: Depression Medication stabilization  Comments:The patient has a long history of depression beginning at the age of 12. While younger she attempted suicides several times by cutting and overdose. She has recently been more stable on a combination of Prozac, Lamictal, and trazodone. Over the past 3 days however she became insomniac and more depressed. She reports poor sleep, decreased appetite, anhedonia, feeling of guilt and hopelessness worthlessness, poor energy and concentration, crying spells, and social isolation. She denies suicidal thinking but she ended up overdosing on her husband's pain medication. This was not the first time it happened and initiated the husband tried to allow her to sleep it off, as before. When he could not wake her up and her breathing became apneic he called the ambulance. She was given injection of narcan with improvement. In the emergency room the patient and her husband presented conflicting report of events. Apparently the patient has been regularly using her husband's prescribed pain medication. She was also positive for stimulants on admission. She was recently [facility] from [place] [facility] for misusing Xanax  Estimated length of stay:1 day  New goal(s):  Review of initial/current patient goals per problem list:   1.  Goal(s): Patient will participate in aftercare plan * Met: No * Target date: at discharge * As evidenced by: Patient will participate within aftercare plan AEB aftercare provider and housing plan at discharge being identified.   2.  Goal (s): Patient will exhibit decreased depressive symptoms and suicidal ideations. * Met:No  *  Target date: at discharge * As evidenced by: Patient will utilize self-rating of depression at 3 or below and demonstrate decreased signs of depression or be deemed stable for discharge by MD.  Attendees: Patient:  Patricia [name] 8/1/20172:43 PM  Family:   8/1/20172:43 PM  Physician:  Orson Slick 8/1/20172:43 PM  Nursing:   Carolynn Sayers, RN 8/1/20172:43 PM  Case Manager:   8/1/20172:43 PM  Counselor:  Dossie Arbour, LCSW 8/1/20172:43 PM  Other:  Everitt Amber, LRT 8/1/20172:43 PM  Other:   8/1/20172:43 PM  Other:   8/1/20172:43 PM  Other:  8/1/20172:43 PM  Other:  8/1/20172:43 PM  Other:  8/1/20172:43 PM  Other:  8/1/20172:43 PM  Other:  8/1/20172:43 PM  Other:  8/1/20172:43 PM  Other:   8/1/20172:43 PM   Scribe for Treatment Team:   August Saucer, 09/29/2015, 2:43 PM, MSW, LCSW

## 2015-09-29 NOTE — H&P (Signed)
Psychiatric Admission Assessment Adult  [name] MRN:  [id number] Date of Evaluation:  09/29/2015 Chief Complaint:  Recurrent Major Depression Disorder Principal Diagnosis: Major depressive disorder, recurrent severe without psychotic features (HCC) Diagnosis:   Patient Active Problem List   Diagnosis Date Noted  . Major depressive disorder, recurrent severe without psychotic features (HCC) [F33.2] 09/28/2015    Priority: High  . Opiate overdose [T40.601A] 09/28/2015  . 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 Involuntary commitment [Z04.6] 09/28/2015  . Opioid use disorder, moderate, dependence (HCC) [F11.20] 09/28/2015  . Anxiety disorder [F41.9] 05/01/2015  . Allergic rhinitis [J30.9] 03/04/2014  . 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 Abnormal toxicological findings [R89.2] 10/22/2013  . ADD (attention deficit disorder) [F90.9] 09/19/2013  . H/O eating disorder [Z86.59] 09/19/2013  . H/O alcohol abuse [F10.10] 09/19/2013   History of Present Illness:   Identifying data. Patricia [name] is a 55 year old female with history of depression, anxiety, mood instability, substance abuse, suicide attempts.  Chief complaint. "I just wanted to sleep."  History of present illness. Information was obtained from the patient and the chart. The patient has a long history of depression beginning at the age of 36. While younger she attempted suicides several times by cutting and overdose. She has recently been more stable on a combination of Prozac, Lamictal, and trazodone. Over the past 3 days however she became insomniac and more depressed. She reports poor sleep, decreased appetite, anhedonia, feeling of guilt and hopelessness worthlessness, poor energy and concentration, crying spells, and social isolation. She denies suicidal thinking but she ended up overdosing on her husband's pain medication. This was not the first time it  happened and initiated the husband tried to allow her to sleep it off, as before. When he could not wake her up and her breathing became apneic he called the ambulance. She was given injection of narcan with improvement. In the emergency room the patient and her husband presented conflicting report of events. Apparently the patient has been regularly using her husband's prescribed pain medication. She was also positive for stimulants on admission. She was recently dismissed from [place] [facility] for misusing Xanax. She currently does not have a provider. In the emergency room the patient several times change her story sometimes reporting that she was suicidal, sometimes denying it. Today she tells me that because of insomnia she was trying to use narcotics and denies any suicidal ideas, intentions, or plans. She denies any symptoms of psychosis or symptoms suggestive of bipolar mania. She reports panic attacks and social anxiety symptoms with daily panic attacks. She is unable to go to the mall without a chaperone. The patient is rather vague about her substance use. She admits that she shares narcotic painkillers that are prescribed for her husband and that she took stimulant that have not been prescribed for her. She reports that she no longer takes benzodiazepines she has not been drinking alcohol for years.  Past psychiatric history. She is has been struggling with depression since her teenage years. She had several psychiatric hospital overdose and cutting when younger. One of the overdose was severe enough to require respirator. She has narrowing of her trachea from it. She has been tried on multiple antidepressants. She believes that the combination of Lamictal and high-dose Prozac works well for her. She has never been on antipsychotic. There is a history of alcoholism but she has been able to maintain sobriety for several years now.  Family  psychiatric history. She  denies any.  Social history. She lives with her husband, her son and his girlfriend. 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  Total Time spent with patient: 1 hour  Is the patient at risk to self? Yes.    Has the patient been a risk to self in the past 6 months? No.  Has the patient been a risk to self within the distant past? Yes.    Is the patient a risk to others? No.  Has the patient been a risk to others in the past 6 months? No.  Has the patient been a risk to others within the distant past? No.   Prior Inpatient Therapy:   Prior Outpatient Therapy:    Alcohol Screening: 1. How often do you have a drink containing alcohol?: Never 9. Have you or someone else been injured as a result of your drinking?: No 10. Has a relative or friend or a doctor or another health worker been concerned about your drinking or suggested you cut down?: Yes, but not in the last year Alcohol Use Disorder Identification Test Final Score (AUDIT): 2 Brief Intervention: AUDIT score less than 7 or less-screening does not suggest unhealthy drinking-brief intervention not indicated Substance Abuse History in the last 12 months:  Yes.   Consequences of Substance Abuse: Negative Previous Psychotropic Medications: Yes  Psychological Evaluations: No  Past Medical History:  Past Medical History:  Diagnosis Date  . Alcohol abuse unk  . Anxiety unk  . Aspiration into airway   . Depression unk    Past Surgical History:  Procedure Laterality Date  . ABDOMINAL HYSTERECTOMY    . FOOT SURGERY    . TRACHEAL SURGERY     Family History:  Family History  Problem Relation Age of Onset  . Breast cancer Mother   . Hypertension Mother   . Depression Mother   . Anxiety disorder Mother   . Alcohol abuse Father   . Alcohol abuse Sister   . Anxiety disorder Sister   . Bipolar disorder Sister    Tobacco Screening: ([phone])::1)@ Social History:  History  Alcohol Use No    Comment: former alcoholic  "sober for a month now"     History  Drug Use No    Additional Social History:                           Allergies:   Allergies  Allergen Reactions  . Penicillins Hives    Has patient had a PCN reaction causing immediate rash, facial/tongue/throat swelling, SOB or lightheadedness with hypotension: Yes Has patient had a PCN reaction causing severe rash involving mucus membranes or skin necrosis: No Has patient had a PCN reaction that required hospitalization Yes Has patient had a PCN reaction occurring within the last 10 years: Yes If all of the above answers are "NO", then may proceed with Cephalosporin use.    Lab Results:  Results for orders placed or performed during the hospital encounter of 09/28/15 (from the past 48 hour(s))  TSH     Status: None   Collection Time: 09/28/15  7:00 PM  Result Value Ref Range   TSH 1.029 0.350 - 4.500 uIU/mL    Blood Alcohol level:  Lab Results  Component Value Date   ETH <5 09/27/2015   ETH 12 (H) 02/06/2015    Metabolic Disorder Labs:  Lab Results  Component Value Date   HGBA1C 4.9 02/07/2015  MPG 103 08/17/2013   No results found for: PROLACTIN Lab Results  Component Value Date   CHOL 232 (H) 02/07/2015   TRIG 82 02/07/2015   HDL 55 02/07/2015   CHOLHDL 4.2 02/07/2015   VLDL 16 02/07/2015   LDLCALC 161 (H) 02/07/2015    Current Medications: Current Facility-Administered Medications  Medication Dose Route Frequency Provider Last Rate Last Dose  . acetaminophen (TYLENOL) tablet 650 mg  650 mg Oral Q6H PRN Audery Amel, MD      . albuterol (PROVENTIL HFA;VENTOLIN HFA) 108 (90 Base) MCG/ACT inhaler 2 puff  2 puff Inhalation Q4H PRN Audery Amel, MD      . alum & mag hydroxide-simeth (MAALOX/MYLANTA) 200-200-20 MG/5ML suspension 30 mL  30 mL Oral Q4H PRN Audery Amel, MD      . atorvastatin (LIPITOR) tablet 20 mg  20 mg Oral Q2000 Jolanta B Pucilowska, MD   20 mg at 09/28/15 2154  . FLUoxetine (PROZAC)  capsule 80 mg  80 mg Oral Daily Audery Amel, MD   80 mg at 09/29/15 0933  . lamoTRIgine (LAMICTAL) tablet 200 mg  200 mg Oral Daily Audery Amel, MD   200 mg at 09/29/15 0933  . magnesium hydroxide (MILK OF MAGNESIA) suspension 30 mL  30 mL Oral Daily PRN Audery Amel, MD      . traZODone (DESYREL) tablet 200 mg  200 mg Oral QHS Audery Amel, MD   200 mg at 09/28/15 2154   PTA Medications: Prescriptions Prior to Admission  Medication Sig Dispense Refill Last Dose  . albuterol (VENTOLIN HFA) 108 (90 BASE) MCG/ACT inhaler Inhale 2 puffs into the lungs every 6 (six) hours as needed for wheezing or shortness of breath.    prn at prn  . ALPRAZolam (XANAX) 1 MG tablet Take 1 mg by mouth 2 (two) times daily as needed for anxiety.   prn at prn  . atorvastatin (LIPITOR) 40 MG tablet Take 1 tablet (40 mg total) by mouth daily at 6 PM. 30 tablet 0 09/26/2015 at Unknown time  . folic acid (FOLVITE) 1 MG tablet Take 1 tablet (1 mg total) by mouth daily. 30 tablet 0 09/26/2015 at Unknown time  . lamoTRIgine (LAMICTAL) 200 MG tablet Take 1 tablet (200 mg total) by mouth daily. 90 tablet 1 09/26/2015 at Unknown time  . Multiple Vitamin (MULTIVITAMIN WITH MINERALS) TABS tablet Take 1 tablet by mouth daily. 30 tablet 0 09/26/2015 at Unknown time  . thiamine (VITAMIN B-1) 100 MG tablet Take 1 tablet (100 mg total) by mouth daily. 30 tablet 0 09/26/2015 at Unknown time  . traZODone (DESYREL) 100 MG tablet Take 2 tablets (200 mg total) by mouth at bedtime. 60 tablet 2 09/26/2015 at Unknown time    Musculoskeletal: Strength & Muscle Tone: within normal limits Gait & Station: normal Patient leans: N/A  Psychiatric Specialty Exam: Physical Exam  Nursing note and vitals reviewed. Constitutional: She is oriented to person, place, and time. She appears well-developed and well-nourished.  HENT:  Head: Normocephalic and atraumatic.  Eyes: Conjunctivae and EOM are normal. Pupils are equal, round, and reactive to  light.  Neck: Normal range of motion. Neck supple.  Cardiovascular: Normal rate, regular rhythm and normal heart sounds.   Respiratory: Effort normal and breath sounds normal.  GI: Soft. Bowel sounds are normal.  Musculoskeletal: Normal range of motion.  Neurological: She is alert and oriented to person, place, and time.  Skin: Skin is warm and dry.  Review of Systems  Psychiatric/Behavioral: Positive for depression and substance abuse. The patient is nervous/anxious and has insomnia.   All other systems reviewed and are negative.   Blood pressure 95/78, pulse 86, temperature 98 F (36.7 C), temperature source Oral, resp. rate 20, height 5\' 5"  (1.651 m), weight 64 kg (141 lb), SpO2 98 %.Body mass index is 23.46 kg/m.  See SRA.                                                  Sleep:  Number of Hours: 9       Treatment Plan Summary: Daily contact with patient to assess and evaluate symptoms and progress in treatment and Medication management   Ms. Morici is a 55 year old female with a history of depression, anxiety, mood instability, substance use, and suicide attempts admitted for worsening of depression and another suicide attempt with overdose on narcotic painkillers.  1. Suicidal ideation. The patient is able to contract for safety in the hospital.  2. Mood and anxiety. We will continue Prozac 80 mg daily and Lamictal 200 mg nightly for depression and mood stabilization as in the community.  3. Insomnia. We will start Seroquel tonight for further mood stabilization.  4. Substance abuse. The patient was positive for narcotic pain killers and stimulants. She admits that she uses her husband's medication. She minimizes her problems and declines substance abuse treatment. She has a therapist in [place]. She assures me that she has been discusses substance abuse problems with the counselor.   5. Dyslipidemia. She is on Lipitor.  6. Metabolic syndrome  monitoring. Lipid profile, TSH, hemoglobin A1c and prolactin are pending.  7. Disposition. She will be discharged to home with her husband. She will follow up with [facility] for medication management in [facility] for [facility].   Observation Level/Precautions:  15 minute checks  Laboratory:  CBC Chemistry Profile UDS UA  Psychotherapy:    Medications:    Consultations:    Discharge Concerns:    Estimated LOS:  Other:     I certify that inpatient services furnished can reasonably be expected to improve the patient's condition.    Kristine Linea, MD 8/1/201710:19 AM

## 2015-09-29 NOTE — [facility] Group Notes (Signed)
Goals Group  Date/Time: 09/29/2015 9am  Type of Therapy and Topic: Group Therapy: Goals Group: SMART Goals   Pt was called, but did not attend   Mohmmad Saleeby F. Brazil Voytko, LCSWA, LCAS  

## 2015-09-29 NOTE — [facility] Group Notes (Signed)
[facility] Group Notes:  (Nursing/MHT/Case Management/Adjunct)  Date:  09/29/2015  Time:  3:34 PM  Type of Therapy:  Psychoeducational Skills  Participation Level:  Did Not Attend  Patricia [name] 09/29/2015, 3:34 PM

## 2015-09-29 NOTE — [facility] Group Notes (Signed)
[facility] LCSW Group Therapy   09/29/2015  2pm  Type of Therapy: Group Therapy   Participation Level: Did Not Attend. Patient invited to participate but declined.    Allayah Raineri F. Patrice Moates, MSW, LCSWA, LCAS        

## 2015-09-29 NOTE — Progress Notes (Signed)
Recreation Therapy Notes  At approximately 1:55 pm, LRT attempted assessment. Patient was sleeping and did not wake up when name was called.  Jacquelynn Cree, LRT/CTRS 09/29/2015 2:42 PM

## 2015-09-30 LAB — LIPID PANEL
CHOL/HDL RATIO: 2.2 ratio
CHOLESTEROL: 165 mg/dL (ref 0–200)
HDL: 76 mg/dL (ref >40)
LDL Cholesterol: 73 mg/dL (ref 0–99)
Triglycerides: 78 mg/dL (ref <150)
VLDL: 16 mg/dL (ref 0–40)

## 2015-09-30 LAB — TSH: TSH: 1.707 u[IU]/mL (ref 0.350–4.500)

## 2015-09-30 LAB — HEMOGLOBIN A1C: HEMOGLOBIN A1C: 5.1 % (ref 4.0–6.0)

## 2015-09-30 IMAGING — CT CT ABD-PELV W/ CM
2 of 5 series · 16 of 46 positions shown, 18 images · IV contrast (isovue)
Comparison: None.

CLINICAL DATA: Headache dizziness, multiple recent falls

EXAM:
CT ABDOMEN AND PELVIS WITH CONTRAST
TECHNIQUE: Multidetector CT imaging of the abdomen and pelvis was performed
using the standard protocol following bolus administration of
intravenous contrast.
CONTRAST:  85 mm Isovue 370

[Series 2: routine abd pel with · axial · 0.71mm/px · z∈[-589,-214]mm · 13 of 85 slices shown, 15 images]
[im 5/85  soft-tissue]
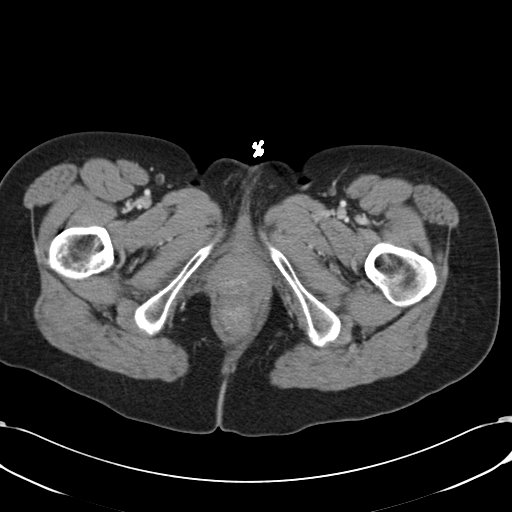
[im 5/85  bone]
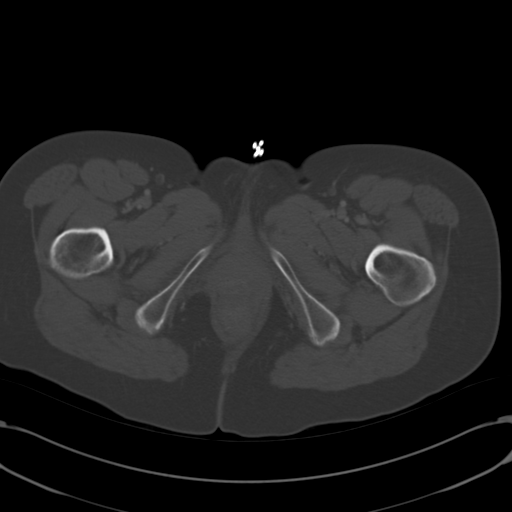
[im 10/85  soft-tissue]
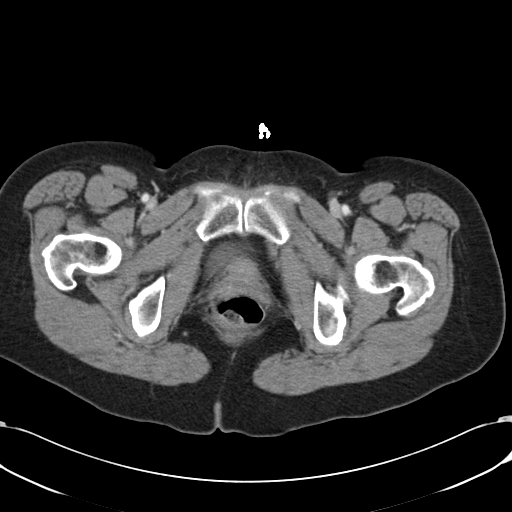
[im 19/85  soft-tissue]
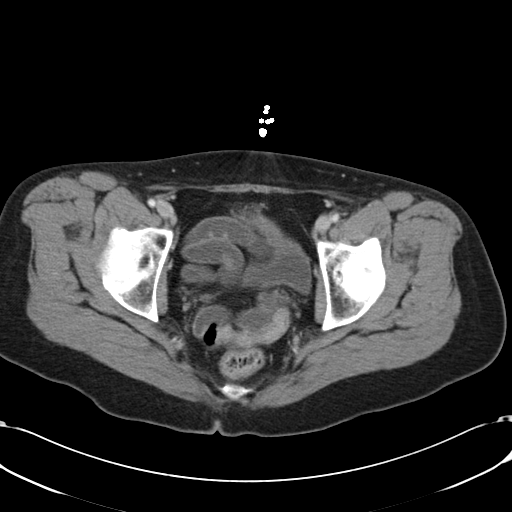
[im 24/85  soft-tissue]
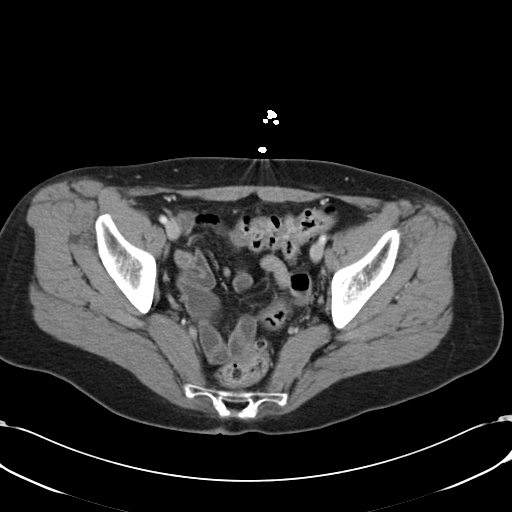
[im 29/85  soft-tissue]
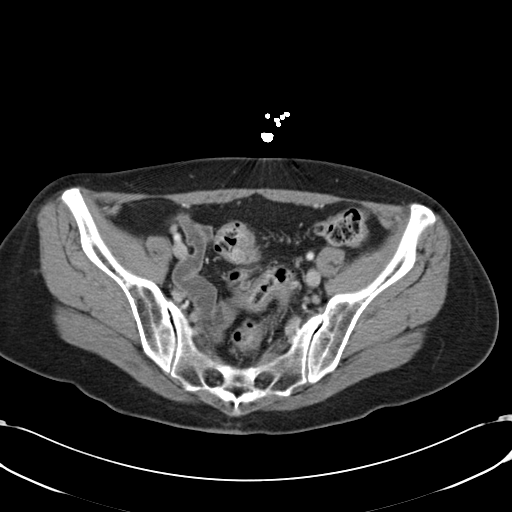
[im 38/85  soft-tissue]
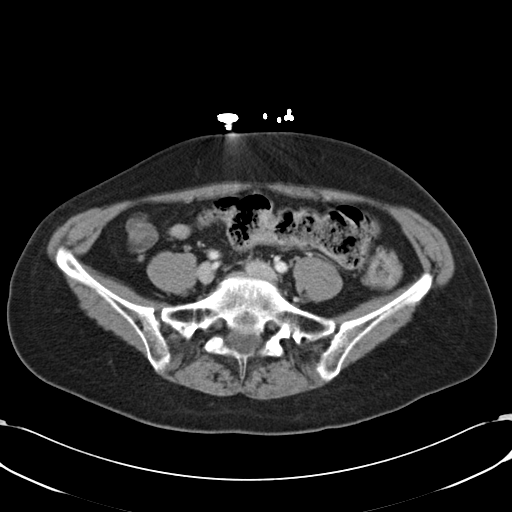
[im 43/85  soft-tissue]
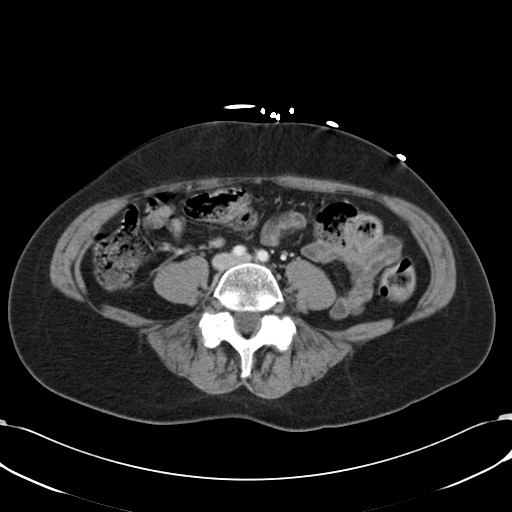
[im 47/85  soft-tissue]
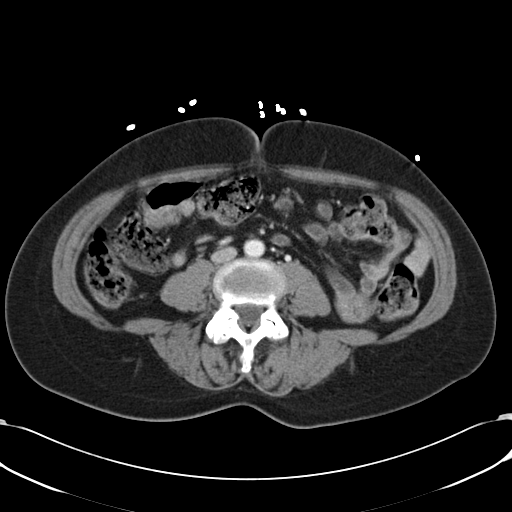
[im 57/85  soft-tissue]
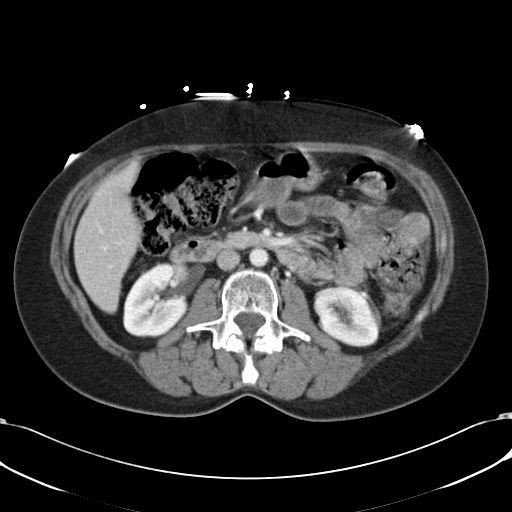
[im 57/85  bone]
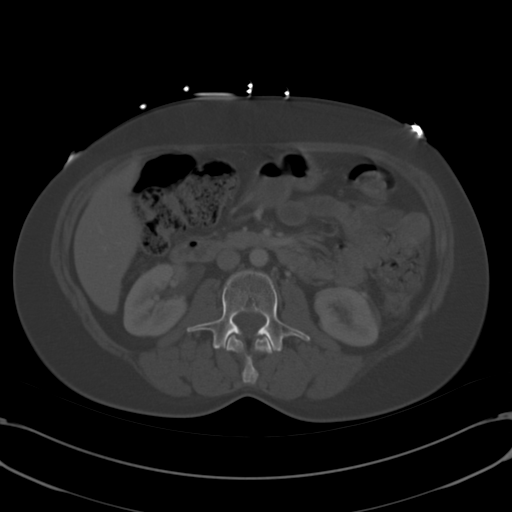
[im 61/85  soft-tissue]
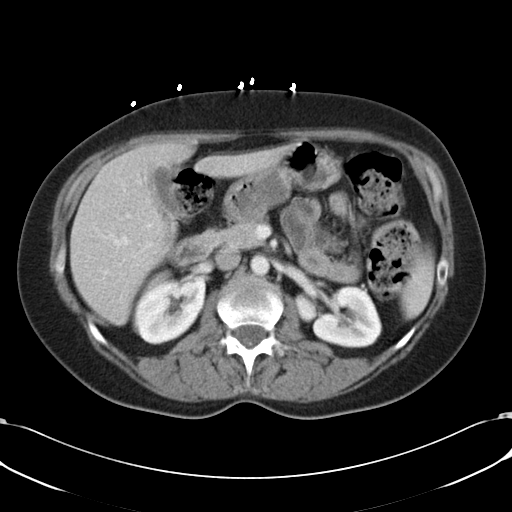
[im 66/85  soft-tissue]
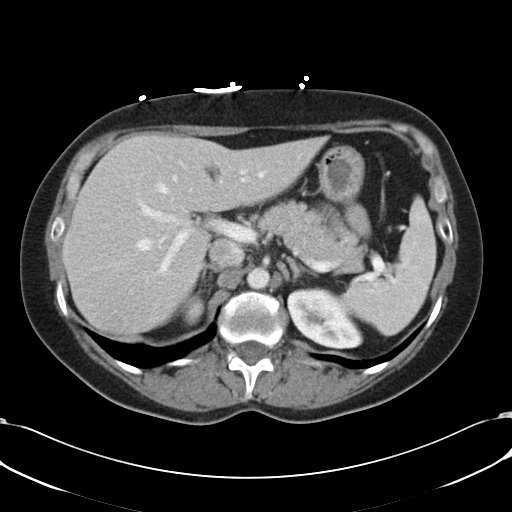
[im 75/85  soft-tissue]
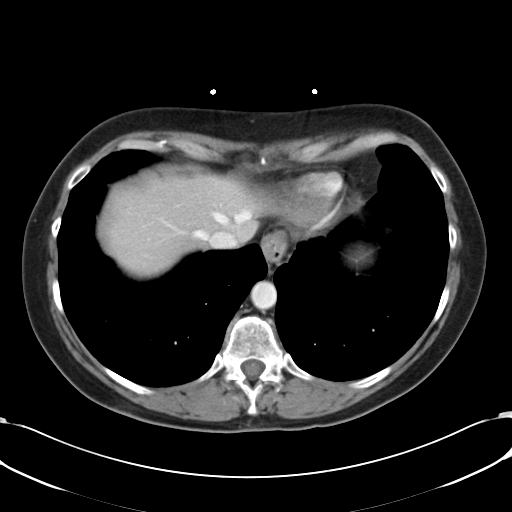
[im 80/85  soft-tissue]
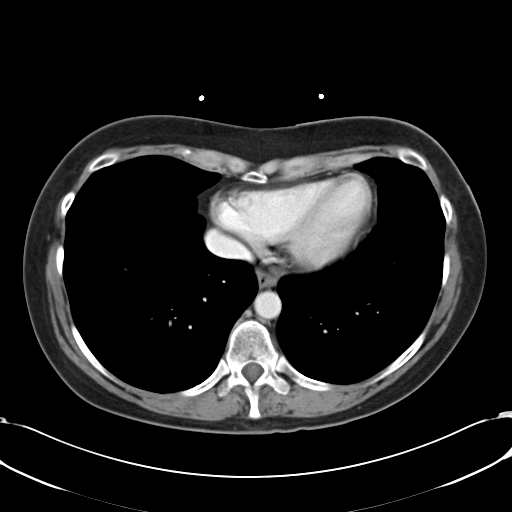

[Series 5: cor routine abd pel with · coronal · 0.62mm/px · 3 of 108 slices shown]
[im 36/108  soft-tissue]
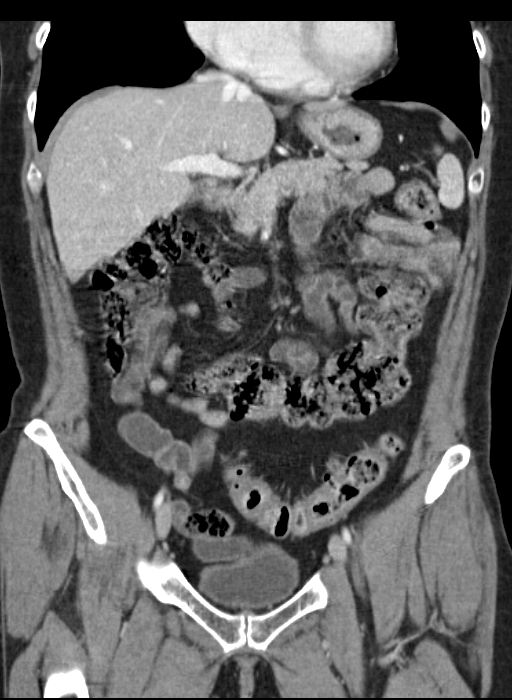
[im 48/108  soft-tissue]
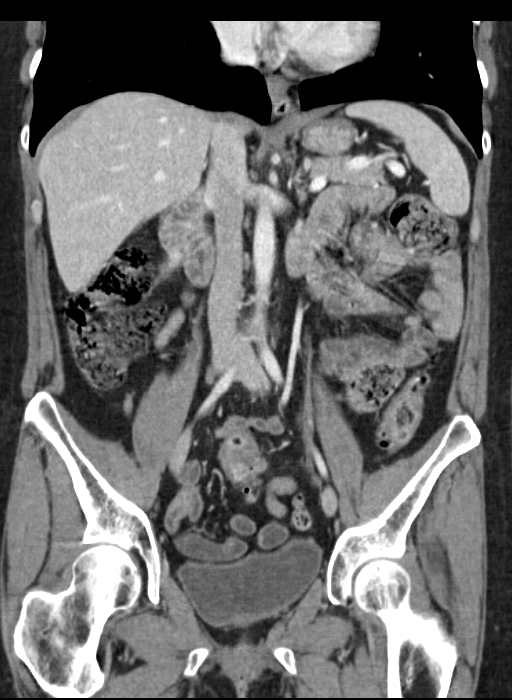
[im 60/108  soft-tissue]
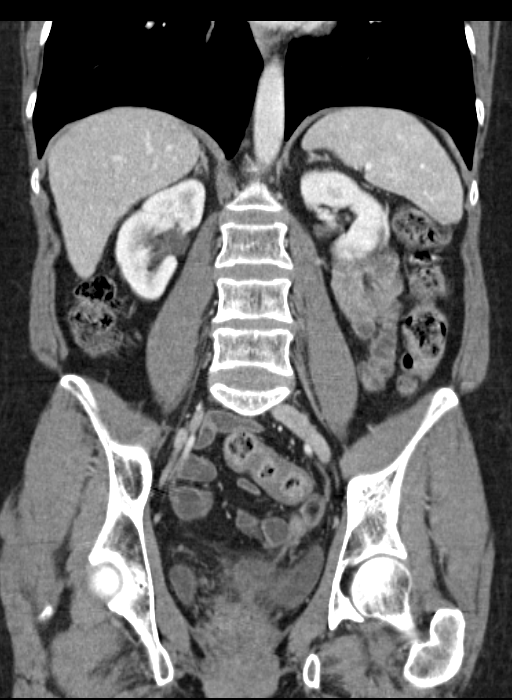

[16 of 46 positions shown; findings below may reference images not displayed]

FINDINGS: Lower Chest: The lung bases are clear. Visualized cardiac structures
are within normal limits for size. No pericardial effusion.
Unremarkable visualized distal thoracic esophagus. Small fat
containing right Bochdalek's hernia likely of no clinical
significance. Slightly decreased AP diameter of the chest.

Abdomen: Unremarkable CT appearance of the stomach, duodenum,
spleen, adrenal glands, pancreas and liver. Gallbladder is
unremarkable. No intra or extrahepatic biliary ductal dilatation.

Unremarkable appearance of the bilateral kidneys. No focal solid
lesion, hydronephrosis or nephrolithiasis.

No evidence of obstruction or focal bowel wall thickening. Normal
appendix in the right lower quadrant. The terminal ileum is
unremarkable. Colonic diverticular disease without CT evidence of
active inflammation.

Pelvis: Surgical changes of prior hysterectomy. No free fluid or
suspicious adenopathy. Unremarkable appearance of the bladder.

Bones/Soft Tissues: No acute fracture or aggressive appearing lytic
or blastic osseous lesion. Mild L4-L5 and L5-S1 facet arthropathy.

Vascular: No significant atherosclerotic vascular disease,
aneurysmal dilatation or acute abnormality.
IMPRESSION: No acute abnormality in the abdomen or pelvis.

## 2015-09-30 MED ORDER — QUETIAPINE FUMARATE 200 MG PO TABS: 200.0000 mg | ORAL_TABLET | Freq: Every day | ORAL | Status: DC

## 2015-09-30 MED ORDER — NALOXONE HCL 4 MG/0.1ML NA LIQD: NASAL | 2 refills | Status: DC

## 2015-09-30 MED ORDER — QUETIAPINE FUMARATE 200 MG PO TABS: 200.0000 mg | ORAL_TABLET | Freq: Every day | ORAL | 1 refills | Status: DC

## 2015-09-30 NOTE — Tx Team (Signed)
Interdisciplinary Treatment Plan Update (Adult)  Date:  09/30/2015 Time Reviewed:  3:43 PM Pt being discharged today. 1.  Goal(s): Patient will participate in aftercare plan *                     Met: YES *                     Target date: at discharge *                     As evidenced by: Patient will participate within aftercare plan AEB aftercare provider and housing plan at discharge being identified.   2.  Goal (s): Patient will exhibit decreased depressive symptoms and suicidal ideations. *                     Met:YES *                      Target date: at discharge *                     As evidenced by: Patient will utilize self-rating of depression at 3 or below and demonstrate decreased signs of depression or be deemed stable for discharge by MD  Scribe for Treatment Team:   August Saucer, 09/30/2015, 3:43 PM, MSW, LCSW

## 2015-09-30 NOTE — Progress Notes (Signed)
Recreation Therapy Notes  Date: 08.02.17 Time: 9:30 am Location: Craft Room  Group Topic: Self-esteem  Goal Area(s) Addresses:  Patient will write at least one positive trait about self. Patient will verbalize benefit of having healthy self-esteem.  Behavioral Response: Did not attend  Intervention: I Am  Activity: Patients were given a worksheet with the letter I on it and instructed to write as many positive traits about themselves inside the letter.   Education: LRT educated patients on ways they can increase their self-esteem.  Education Outcome: Patient did not attend group.  Clinical Observations/Feedback: Patient did not attend group.  Lanissa Cashen M, LRT/CTRS 09/30/2015 1:05 PM 
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
## 2015-09-30 NOTE — Plan of Care (Signed)
Problem: Activity: Goal: Sleeping patterns will improve Outcome: Progressing Patient able to identify Seroquel from Trazodone given the previous night; she continues to have uninterrupted sleep so far into this shift.

## 2015-09-30 NOTE — [facility] Suicide Risk Assessment (Signed)
[facility] INPATIENT:  Family/Significant Other Suicide Prevention Education  Suicide Prevention Education:  Education Completed; Shanalee Selfe, husband [phone],  (name of family member/significant other) has been identified by the patient as the family member/significant other with whom the patient will be residing, and identified as the person(s) who will aid the patient in the event of a mental health crisis (suicidal ideations/suicide attempt).  With written consent from the patient, the family member/significant other has been provided the following suicide prevention education, prior to the and/or following the discharge of the patient.  The suicide prevention education provided includes the following:  Suicide risk factors  Suicide prevention and interventions  National Suicide Hotline telephone number  [facility] assessment telephone number  [facility] Emergency Assistance 911  [facility] and/or Residential Mobile Crisis Unit telephone number  Request made of family/significant other to:  Remove weapons (e.g., guns, rifles, knives), all items previously/currently identified as safety concern.    Remove drugs/medications (over-the-counter, prescriptions, illicit drugs), all items previously/currently identified as a safety concern.  The family member/significant other verbalizes understanding of the suicide prevention education information provided.  The family member/significant other agrees to remove the items of safety concern listed above.  Glennon Mac, MSW, LCSW 09/30/2015, 3:41 PM

## 2015-09-30 NOTE — Discharge Summary (Signed)
Physician Discharge Summary Note  Patient:  Patricia [name] is an 55 y.o., female MRN:  [id number] DOB:  04/19/1960 Patient phone:  [phone] (home)  Patient address:   [address] [place] [address],  Total Time spent with patient: 30 minutes  Date of Admission:  09/28/2015 Date of Discharge: 09/30/2015  Reason for Admission:  Suicide attempt.  Identifying data. [name] is a 55 year old female with history of depression, anxiety, mood instability, substance abuse, suicide attempts.  Chief complaint. "I just wanted to sleep."  History of present illness. Information was obtained from the patient and the chart. The patient has a long history of depression beginning at the age of 14. While younger she attempted suicides several times by cutting and overdose. She has recently been more stable on a combination of Prozac, Lamictal, and trazodone. Over the past 3 days however she became insomniac and more depressed. She reports poor sleep, decreased appetite, anhedonia, feeling of guilt and hopelessness worthlessness, poor energy and concentration, crying spells, and social isolation. She denies suicidal thinking but she ended up overdosing on her husband's pain medication. This was not the first time it happened and initiated the husband tried to allow her to sleep it off, as before. When he could not wake her up and her breathing became apneic he called the ambulance. She was given injection of narcan with improvement. In the emergency room the patient and her husband presented conflicting report of events. Apparently the patient has been regularly using her husband's prescribed pain medication. She was also positive for stimulants on admission. She was recently [facility] from [facility] for misusing Xanax. She currently does not have a provider. In the emergency room the patient several times change her story sometimes reporting that she was  suicidal, sometimes denying it. Today she tells me that because of insomnia she was trying to use narcotics and denies any suicidal ideas, intentions, or plans. She denies any symptoms of psychosis or symptoms suggestive of bipolar mania. She reports panic attacks and social anxiety symptoms with daily panic attacks. She is unable to go to the mall without a chaperone. The patient is rather vague about her substance use. She admits that she shares narcotic painkillers that are prescribed for her husband and that she took stimulant that have not been prescribed for her. She reports that she no longer takes benzodiazepines she has not been drinking alcohol for years.  Past psychiatric history. She is has been struggling with depression since her teenage years. She had several psychiatric hospital overdose and cutting when younger. One of the overdose was severe enough to require respirator. She has narrowing of her trachea from it. She has been tried on multiple antidepressants. She believes that the combination of Lamictal and high-dose Prozac works well for her. She has never been on antipsychotic. There is a history of alcoholism but she has been able to maintain sobriety for several years now.  Family psychiatric history. She denies any.  Social history. She lives with her husband, her son and his girlfriend. 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   Principal Problem: Major depressive disorder, recurrent severe without psychotic features [facility]) Discharge Diagnoses: Patient Active Problem List   Diagnosis Date Noted  . Major depressive disorder, recurrent severe without psychotic features (HCC) [F33.2] 09/28/2015    Priority: High  . Opiate overdose [T40.601A] 09/28/2015  . 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 Involuntary commitment [Z04.6] 09/28/2015  .  Opioid use disorder, moderate, dependence (HCC) [F11.20] 09/28/2015  . Anxiety disorder [F41.9] 05/01/2015  . Allergic  rhinitis [J30.9] 03/04/2014  . 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 Abnormal toxicological findings [R89.2] 10/22/2013  . ADD (attention deficit disorder) [F90.9] 09/19/2013  . H/O eating disorder [Z86.59] 09/19/2013  . H/O alcohol abuse [F10.10] 09/19/2013   Past Medical History:  Past Medical History:  Diagnosis Date  . Alcohol abuse unk  . Anxiety unk  . Aspiration into airway   . Depression unk    Past Surgical History:  Procedure Laterality Date  . ABDOMINAL HYSTERECTOMY    . FOOT SURGERY    . TRACHEAL SURGERY     Family History:  Family History  Problem Relation Age of Onset  . Breast cancer Mother   . Hypertension Mother   . Depression Mother   . Anxiety disorder Mother   . Alcohol abuse Father   . Alcohol abuse Sister   . Anxiety disorder Sister   . Bipolar disorder Sister     Social History:  History  Alcohol Use No    Comment: former alcoholic "sober for a month now"     History  Drug Use No    Social History   Social History  . Marital status: Married    Spouse name: N/A  . Number of children: N/A  . Years of education: N/A   Social History Main Topics  . Smoking status: Former Smoker    Types: Cigarettes  . Smokeless tobacco: Never Used  . Alcohol use No     Comment: former alcoholic "sober for a month now"  . Drug use: No  . Sexual activity: Not Currently   Other Topics Concern  . None   Social History Narrative  . None    Hospital Course:    [name] is a 55 year old female with a history of depression, anxiety, mood instability, substance use, and suicide attempts admitted for worsening of depression and another suicide attempt by overdose on narcotic painkillers.  1. Suicidal ideation. This has resolved. The patient is able to contract for safety. She is forward thinking and optimistic about the future. She is a loving mother and wife.  2. Mood and anxiety. We continued Prozac 80 mg daily and  Lamictal 200 mg nightly for depression and mood stabilization as in the community. We added Seroquel for further mood stabilization.  3. Insomnia. Resolved with Seroquel.   4. Substance abuse. The patient was positive for narcotic pain killers and stimulants. She admits that she uses her husband's medication. She minimizes her problems and declines substance abuse treatment. She has a therapist in [place]. She assures me that she has been discussing substance abuse problems with the counselor.   5. Dyslipidemia. She is on Lipitor.  6. Metabolic syndrome monitoring. Lipid profile, TSH, hemoglobin A1c and prolactin are pending.  7. Disposition. She was discharged to home with her husband. She will follow up with [facility] for medication management and [facility] for therapy.  Physical Findings: AIMS:  , ,  ,  ,    CIWA:    COWS:     Musculoskeletal: Strength & Muscle Tone: within normal limits Gait & Station: normal Patient leans: N/A  Psychiatric Specialty Exam: Physical Exam  Nursing note and vitals reviewed.   Review of Systems  Psychiatric/Behavioral: Positive for substance abuse. The patient is nervous/anxious and has insomnia.   All other systems reviewed and are negative.   Blood  pressure 134/81, pulse 70, temperature 98.7 F (37.1 C), temperature source Oral, resp. rate 20, height 5\' 5"  (1.651 m), weight 64 kg (141 lb), SpO2 98 %.Body mass index is 23.46 kg/m.  See SRA.                                                  Sleep:  Number of Hours: 6.15     Have you used any form of tobacco in the last 30 days? (Cigarettes, Smokeless Tobacco, Cigars, and/or Pipes): No  Has this patient used any form of tobacco in the last 30 days? (Cigarettes, Smokeless Tobacco, Cigars, and/or Pipes) Yes, No  Blood Alcohol level:  Lab Results  Component Value Date   ETH <5 09/27/2015   ETH 12 (H) 02/06/2015    Metabolic Disorder Labs:   Lab Results  Component Value Date   HGBA1C 4.9 02/07/2015   MPG 103 08/17/2013   No results found for: PROLACTIN Lab Results  Component Value Date   CHOL 232 (H) 02/07/2015   TRIG 82 02/07/2015   HDL 55 02/07/2015   CHOLHDL 4.2 02/07/2015   VLDL 16 02/07/2015   LDLCALC 161 (H) 02/07/2015    See Psychiatric Specialty Exam and Suicide Risk Assessment completed by Attending Physician prior to discharge.  Discharge destination:  Home  Is patient on multiple antipsychotic therapies at discharge:  No   Has Patient had three or more failed trials of antipsychotic monotherapy by history:  No  Recommended Plan for Multiple Antipsychotic Therapies: NA  Discharge Instructions    Diet - low sodium heart healthy    Complete by:  As directed   Increase activity slowly    Complete by:  As directed       Medication List    STOP taking these medications   ALPRAZolam 1 MG tablet Commonly known as:  XANAX   traZODone 100 MG tablet Commonly known as:  DESYREL     TAKE these medications     Indication  atorvastatin 40 MG tablet Commonly known as:  LIPITOR Take 1 tablet (40 mg total) by mouth daily at 6 PM.  Indication:  High Amount of Triglycerides in the Blood   FLUoxetine 40 MG capsule Commonly known as:  PROZAC Take 2 capsules (80 mg total) by mouth daily.  Indication:  Major Depressive Disorder   folic acid 1 MG tablet Commonly known as:  FOLVITE Take 1 tablet (1 mg total) by mouth daily.  Indication:  Anemia From Inadequate Folic Acid   lamoTRIgine 200 MG tablet Commonly known as:  LAMICTAL Take 1 tablet (200 mg total) by mouth daily.  Indication:  Manic-Depression   multivitamin with minerals Tabs tablet Take 1 tablet by mouth daily.  Indication:  general health.   QUEtiapine 200 MG tablet Commonly known as:  SEROQUEL Take 1 tablet (200 mg total) by mouth at bedtime.  Indication:  Depressive Phase of Manic-Depression   thiamine 100 MG tablet Commonly known  as:  VITAMIN B-1 Take 1 tablet (100 mg total) by mouth daily.  Indication:  Loss of Appetite   VENTOLIN HFA 108 (90 Base) MCG/ACT inhaler Generic drug:  albuterol Inhale 2 puffs into the lungs every 6 (six) hours as needed for wheezing or shortness of breath.  Indication:  Chronic Obstructive Lung Disease        Follow-up recommendations:  Activity:  As tolerated. Diet:  Low sodium heart healthy. Other:  Keep follow-up appointments.  Comments:    Signed: Kristine Linea, MD 09/30/2015, 9:01 AM

## 2015-09-30 NOTE — [facility] Suicide Risk Assessment (Signed)
[facility] Discharge Suicide Risk Assessment   Principal Problem: Major depressive disorder, recurrent severe without psychotic features [facility]) Discharge Diagnoses:  Patient Active Problem List   Diagnosis Date Noted  . Major depressive disorder, recurrent severe without psychotic features (HCC) [F33.2] 09/28/2015    Priority: High  . Opiate overdose [T40.601A] 09/28/2015  . 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 Involuntary commitment [Z04.6] 09/28/2015  . Opioid use disorder, moderate, dependence (HCC) [F11.20] 09/28/2015  . Anxiety disorder [F41.9] 05/01/2015  . Allergic rhinitis [J30.9] 03/04/2014  . 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 Abnormal toxicological findings [R89.2] 10/22/2013  . ADD (attention deficit disorder) [F90.9] 09/19/2013  . H/O eating disorder [Z86.59] 09/19/2013  . H/O alcohol abuse [F10.10] 09/19/2013    Total Time spent with patient: 30 minutes  Musculoskeletal: Strength & Muscle Tone: within normal limits Gait & Station: normal Patient leans: N/A  Psychiatric Specialty Exam: Review of Systems  Psychiatric/Behavioral: The patient is nervous/anxious and has insomnia.   All other systems reviewed and are negative.   Blood pressure 134/81, pulse 70, temperature 98.7 F (37.1 C), temperature source Oral, resp. rate 20, height 5\' 5"  (1.651 m), weight 64 kg (141 lb), SpO2 98 %.Body mass index is 23.46 kg/m.  General Appearance: Casual  Eye Contact::  Good  Speech:  Clear and Coherent409  Volume:  Normal  Mood:  Euthymic  Affect:  Appropriate  Thought Process:  Goal Directed  Orientation:  Full (Time, Place, and Person)  Thought Content:  WDL  Suicidal Thoughts:  No  Homicidal Thoughts:  No  Memory:  Immediate;   Fair Recent;   Fair Remote;   Fair  Judgement:  Impaired  Insight:  Shallow  Psychomotor Activity:  Normal  Concentration:  Fair  Recall:  [employer] of Knowledge:Fair  Language: Fair   Akathisia:  No  Handed:  Right  AIMS (if indicated):     Assets:  Communication Skills Desire for Improvement Financial Resources/Insurance Housing Intimacy Physical Health Resilience Social Support Transportation  Sleep:  Number of Hours: 6.15  Cognition: WNL  ADL's:  Intact   Mental Status Per Nursing Assessment::   On Admission:     Demographic Factors:  Caucasian and Unemployed  Loss Factors: NA  Historical Factors: Prior suicide attempts and Impulsivity  Risk Reduction Factors:   Sense of responsibility to family, Living with another person, especially a relative, Positive social support and Positive therapeutic relationship  Continued Clinical Symptoms:  Depression:   Comorbid alcohol abuse/dependence Impulsivity Insomnia Alcohol/Substance Abuse/Dependencies  Cognitive Features That Contribute To Risk:  None    Suicide Risk:  Minimal: No identifiable suicidal ideation.  Patients presenting with no risk factors but with morbid ruminations; may be classified as minimal risk based on the severity of the depressive symptoms    Plan Of Care/Follow-up recommendations:  Activity:  as tolerated. Diet:  low sodium heart healthy. Other:  keep follow up appointments.  Kristine Linea, MD 09/30/2015, 8:57 AM

## 2015-09-30 NOTE — Progress Notes (Signed)
D:Patient aware of discharge this shift . Patient returning home . Patient received all belonging locked up . Patient denies  Suicidal  And homicidal ideations  .  A: Writer instructed on discharge criteria  . Informed of  Discharge Summary  . Aware  Of follow up appointment . R: Patient left unit with no questions  Or concerns  With family member.

## 2015-09-30 NOTE — [facility] Group Notes (Signed)
[facility] Group Notes:  (Nursing/MHT/Case Management/Adjunct)  Date:  09/30/2015  Time:  4:37 AM  Type of Therapy:  Group Therapy  Participation Level:  Minimal  Participation Quality:  Attentive  Affect:  Appropriate  Cognitive:  Appropriate  Insight:  Appropriate  Engagement in Group:  Engaged  Modes of Intervention:  n/a  Summary of Progress/Problems:Group was held outside. Pt came outside a few minutes before the group ended. Pt smiled at staff, but did not share a goal.   Veva Holes 09/30/2015, 4:37 AM

## 2015-10-01 LAB — PROLACTIN: Prolactin: 78.6 ng/mL — ABNORMAL HIGH (ref 4.8–23.3)

## 2015-10-01 NOTE — Progress Notes (Signed)
  [facility] Adult Case Management Discharge Plan :  LATE ENTRY FOR CSW [facility] LAWS  Will you be returning to the same living situation after discharge:  Yes,  per record At discharge, do you have transportation home?: Yes,  per record Do you have the ability to pay for your medications: Yes,  per record  Release of information consent forms completed and in the chart;  Patient's signature needed at discharge.  Patient to Follow up at: Follow-up Information    [facility]. Go on 10/05/2015.   Why:  10:15am for hospital follow up counseling. Contact information: [address] [place] [address] [phone] Fax-[phone]       [facility]. Go today.   Why:  Walk in Monday, Wednesday, Friday between 8am-3pm to establish with provider for Medication management.  Arrive early as initial appointment is first come first serve.  Contact information: [address] Noel Christmas [place] [address] [phone] [phone]          Next level of care provider has access to [facility] Link:no  Safety Planning and Suicide Prevention discussed: Yes,  per record  Have you used any form of tobacco in the last 30 days? (Cigarettes, Smokeless Tobacco, Cigars, and/or Pipes): No  Has patient been referred to the Quitline?: N/A patient is not a smoker  Patient has been referred for addiction treatment: Yes  Sallee Lange 10/01/2015, 3:49 PM

## 2015-10-23 ENCOUNTER — Encounter: Payer: Self-pay | Admitting: Emergency Medicine

## 2015-10-23 ENCOUNTER — Emergency Department: Payer: BLUE CROSS/BLUE SHIELD

## 2015-10-23 ENCOUNTER — Emergency Department
Admission: EM | Admit: 2015-10-23 | Discharge: 2015-10-24 | Disposition: A | Discharge status: Other Acute Inpatient Hospital | Payer: BLUE CROSS/BLUE SHIELD | Attending: Emergency Medicine | Admitting: Emergency Medicine

## 2015-10-23 DIAGNOSIS — T1491 Suicide attempt: Secondary | ICD-10-CM | POA: Diagnosis present

## 2015-10-23 DIAGNOSIS — X789XXA Intentional self-harm by unspecified sharp object, initial encounter: Secondary | ICD-10-CM | POA: Insufficient documentation

## 2015-10-23 DIAGNOSIS — T50902A Poisoning by unspecified drugs, medicaments and biological substances, intentional self-harm, initial encounter: Secondary | ICD-10-CM | POA: Insufficient documentation

## 2015-10-23 DIAGNOSIS — Y929 Unspecified place or not applicable: Secondary | ICD-10-CM | POA: Insufficient documentation

## 2015-10-23 DIAGNOSIS — Y999 Unspecified external cause status: Secondary | ICD-10-CM | POA: Diagnosis not present

## 2015-10-23 DIAGNOSIS — Y939 Activity, unspecified: Secondary | ICD-10-CM | POA: Insufficient documentation

## 2015-10-23 DIAGNOSIS — Z79899 Other long term (current) drug therapy: Secondary | ICD-10-CM | POA: Diagnosis not present

## 2015-10-23 DIAGNOSIS — S61512A Laceration without foreign body of left wrist, initial encounter: Secondary | ICD-10-CM | POA: Insufficient documentation

## 2015-10-23 DIAGNOSIS — Z87891 Personal history of nicotine dependence: Secondary | ICD-10-CM | POA: Diagnosis not present

## 2015-10-23 DIAGNOSIS — F332 Major depressive disorder, recurrent severe without psychotic features: Secondary | ICD-10-CM | POA: Diagnosis not present

## 2015-10-23 DIAGNOSIS — F32A Depression, unspecified: Secondary | ICD-10-CM

## 2015-10-23 DIAGNOSIS — Z23 Encounter for immunization: Secondary | ICD-10-CM | POA: Diagnosis not present

## 2015-10-23 DIAGNOSIS — F329 Major depressive disorder, single episode, unspecified: Secondary | ICD-10-CM | POA: Insufficient documentation

## 2015-10-23 DIAGNOSIS — T40601A Poisoning by unspecified narcotics, accidental (unintentional), initial encounter: Secondary | ICD-10-CM | POA: Diagnosis present

## 2015-10-23 DIAGNOSIS — T1491XA Suicide attempt, initial encounter: Secondary | ICD-10-CM

## 2015-10-23 DIAGNOSIS — IMO0002 Reserved for concepts with insufficient information to code with codable children: Secondary | ICD-10-CM

## 2015-10-23 LAB — URINE DRUG SCREEN, QUALITATIVE (ARMC ONLY)
Amphetamines, Ur Screen: NOT DETECTED
Barbiturates, Ur Screen: NOT DETECTED
Benzodiazepine, Ur Scrn: NOT DETECTED
CANNABINOID 50 NG, UR ~~LOC~~: NOT DETECTED
COCAINE METABOLITE, UR ~~LOC~~: NOT DETECTED
MDMA (ECSTASY) UR SCREEN: NOT DETECTED
Methadone Scn, Ur: NOT DETECTED
OPIATE, UR SCREEN: POSITIVE — AB
PHENCYCLIDINE (PCP) UR S: NOT DETECTED
TRICYCLIC, UR SCREEN: POSITIVE — AB

## 2015-10-23 LAB — COMPREHENSIVE METABOLIC PANEL
ALBUMIN: 4.3 g/dL (ref 3.5–5.0)
ALK PHOS: 97 U/L (ref 38–126)
ALT: 27 U/L (ref 14–54)
ANION GAP: 8 (ref 5–15)
AST: 40 U/L (ref 15–41)
BUN: 8 mg/dL (ref 6–20)
CHLORIDE: 101 mmol/L (ref 101–111)
CO2: 28 mmol/L (ref 22–32)
Calcium: 8.9 mg/dL (ref 8.9–10.3)
Creatinine, Ser: 0.64 mg/dL (ref 0.44–1.00)
GFR calc non Af Amer: >60 mL/min (ref >60)
GLUCOSE: 123 mg/dL — AB (ref 65–99)
POTASSIUM: 4.4 mmol/L (ref 3.5–5.1)
SODIUM: 137 mmol/L (ref 135–145)
Total Bilirubin: 0.2 mg/dL — ABNORMAL LOW (ref 0.3–1.2)
Total Protein: 7.6 g/dL (ref 6.5–8.1)

## 2015-10-23 LAB — ETHANOL: Alcohol, Ethyl (B): <5 mg/dL (ref <5)

## 2015-10-23 LAB — CBC
HEMATOCRIT: 40.4 % (ref 35.0–47.0)
HEMOGLOBIN: 13.7 g/dL (ref 12.0–16.0)
MCH: 31.5 pg (ref 26.0–34.0)
MCHC: 33.9 g/dL (ref 32.0–36.0)
MCV: 92.9 fL (ref 80.0–100.0)
Platelets: 227 10*3/uL (ref 150–440)
RBC: 4.35 MIL/uL (ref 3.80–5.20)
RDW: 13.9 % (ref 11.5–14.5)
WBC: 6.1 10*3/uL (ref 3.6–11.0)

## 2015-10-23 LAB — BLOOD GAS, ARTERIAL
ACID-BASE EXCESS: 1.3 mmol/L (ref 0.0–3.0)
BICARBONATE: 29.6 meq/L — AB (ref 21.0–28.0)
FIO2: 0.21
O2 Saturation: 91 %
PCO2 ART: 63 mmHg — AB (ref 32.0–48.0)
PH ART: 7.28 — AB (ref 7.350–7.450)
PO2 ART: 69 mmHg — AB (ref 83.0–108.0)
Patient temperature: 37

## 2015-10-23 LAB — SALICYLATE LEVEL

## 2015-10-23 LAB — ACETAMINOPHEN LEVEL

## 2015-10-23 MED ORDER — ONDANSETRON HCL 4 MG/2ML IJ SOLN
4.0000 mg | Freq: Once | INTRAMUSCULAR | Status: AC
  Administered 2015-10-23: 4 mg via INTRAVENOUS
  Filled 2015-10-23: qty 2

## 2015-10-23 MED ORDER — ALPRAZOLAM 0.5 MG PO TABS
1.0000 mg | ORAL_TABLET | Freq: Three times a day (TID) | ORAL | Status: DC
  Administered 2015-10-23 (×2): 1 mg via ORAL
  Filled 2015-10-23 (×2): qty 2

## 2015-10-23 MED ORDER — CHARCOAL ACTIVATED PO LIQD
50.0000 g | Freq: Once | ORAL | Status: AC
  Administered 2015-10-23: 50 g via ORAL
  Filled 2015-10-23: qty 240

## 2015-10-23 MED ORDER — FLUOXETINE HCL 20 MG PO CAPS
40.0000 mg | ORAL_CAPSULE | Freq: Every day | ORAL | Status: DC
Start: 2015-10-24 — End: 2015-10-24

## 2015-10-23 MED ORDER — TETANUS-DIPHTH-ACELL PERTUSSIS 5-2.5-18.5 LF-MCG/0.5 IM SUSP
0.5000 mL | Freq: Once | INTRAMUSCULAR | Status: AC
  Administered 2015-10-23: 0.5 mL via INTRAMUSCULAR
  Filled 2015-10-23: qty 0.5

## 2015-10-23 MED ORDER — LAMOTRIGINE 25 MG PO TABS: 200.0000 mg | ORAL_TABLET | Freq: Every day | ORAL | Status: DC

## 2015-10-23 MED ORDER — ATORVASTATIN CALCIUM 20 MG PO TABS
20.0000 mg | ORAL_TABLET | Freq: Every day | ORAL | Status: DC
  Administered 2015-10-23: 20 mg via ORAL
  Filled 2015-10-23: qty 1

## 2015-10-23 MED ORDER — SODIUM CHLORIDE 0.9 % IV BOLUS (SEPSIS)
1000.0000 mL | Freq: Once | INTRAVENOUS | Status: AC
  Administered 2015-10-23: 1000 mL via INTRAVENOUS

## 2015-10-23 MED ORDER — QUETIAPINE FUMARATE 200 MG PO TABS
200.0000 mg | ORAL_TABLET | Freq: Every day | ORAL | Status: DC
  Administered 2015-10-23: 200 mg via ORAL
  Filled 2015-10-23: qty 1
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
## 2015-10-23 NOTE — ED Notes (Signed)
Pt maintaining 02 saturation at 99% 2L nasal cannula. Oxygen turned down to 1L nasal cannula. Will monitor oxygen saturation.

## 2015-10-23 NOTE — ED Notes (Signed)
ENVIRONMENTAL ASSESSMENT Potentially harmful objects out of patient reach: Yes Personal belongings secured: Yes Patient dressed in hospital provided attire only: Yes Plastic bags out of patient reach: Yes Patient care equipment (cords, cables, call bells, lines, and drains) shortened, removed, or accounted for: Yes Equipment and supplies removed from bottom of stretcher: Yes Potentially toxic materials out of patient reach: Yes Sharps container removed or out of patient reach: Yes  Patient currently in room sleeping. No signs of distress noted. Maintained on 15 minute checks and observation by security camera for safety.  

## 2015-10-23 NOTE — ED Notes (Signed)
Wound on left wrist was cleaned and bandaged.

## 2015-10-23 NOTE — Progress Notes (Signed)
Pt being [facility]. H&[facility] and [facility] charge nurse to review and provide bed assignment.     10/23/2015 Cheryl FlashNicole Nissa Stannard, MS, NCC, LPCA Therapeutic Triage Specialist

## 2015-10-23 NOTE — ED Notes (Signed)
Pt oxygen keeps dropping, pt placed on 2L nasal cannula, oxygen came right up.

## 2015-10-23 NOTE — ED Notes (Signed)
Pt states tonight she took a prescription that was not hers and cut her L wrist/forearm because "I wanted to die." Pt has a Recruitment consultantsafety sitter at bedside.   Pt states 3 weeks ago she tried to commit suicide by taking pills.  Denies pain at this time. Has thrown up twice-red/brown with food in it.

## 2015-10-23 NOTE — ED Notes (Signed)
Patient resting quietly in room. No noted distress or abnormal behaviors noted. Will continue 15 minute checks and observation by security camera for safety. 

## 2015-10-23 NOTE — ED Notes (Signed)
Patient was brought to the emergency room after locking herself in a bathroom, trying to overdose on husbands pain pills and cutting herself on her left wrist. Patient currently has a dressing on her left wrist that is clean, dry and intact. Patient currently denies SI/HI/AVH and pain. Patient has a sad and depressed affect, is guarded and does not forward much information. Patient is calm and cooperative at this time. Maintained on 15 minute checks and observation by security camera for safety.

## 2015-10-23 NOTE — ED Notes (Signed)
Pt ambulated to toilet with assistance of this RN and Misty StanleyLisa EDT

## 2015-10-23 NOTE — ED Notes (Signed)
Patient asleep in room. No noted distress or abnormal behavior. Will continue 15 minute checks and observation by security cameras for safety. 

## 2015-10-23 NOTE — Consult Note (Signed)
[facility] Consult   Reason for Consult:  Consult for this 55 year old woman with a history of severe recurrent depression came in after trying to kill her self Referring Physician:  Clearnce Hasten [name] MRN:  [id number] Principal Diagnosis: Major depressive disorder, recurrent severe without psychotic features [facility]) Diagnosis:   Patient Active Problem List   Diagnosis Date Noted  . Suicidal ideation [R45.851] 10/23/2015  . Opiate overdose [T40.601A] 09/28/2015  . Stimulant use disorder ([place]) [F15.90] 09/28/2015  . Involuntary commitment [Z04.6] 09/28/2015  . Major depressive disorder, recurrent severe without psychotic features ([place]) [F33.2] 09/28/2015  . Opioid use disorder, moderate, dependence ([place]) [F11.20] 09/28/2015  . Anxiety disorder [F41.9] 05/01/2015  . Allergic rhinitis [J30.9] 03/04/2014  . 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 Abnormal toxicological findings [R89.2] 10/22/2013  . ADD (attention deficit disorder) [F90.9] 09/19/2013  . H/O eating disorder [Z86.59] 09/19/2013  . H/O alcohol abuse [F10.10] 09/19/2013    Total Time spent with patient: 1 hour  Subjective:   Patricia [name] is a 55 y.o. female patient admitted with "it's the same as last time".  HPI:  Patient interviewed. Chart reviewed. Labs and vitals reviewed. 55 year old woman came into the hospital after trying to kill her self. She cut her wrists and also took an overdose of her husband's pain medicine. She says that she was seriously wanting to die yesterday and thought that she was really going to avoid being discovered. She locked herself in the bathroom when she didn't and her husband broke the door down. She took her husband's narcotic pain medicines. She says that she stays sad and depressed all the time and as well as very anxious. The most acute stressor is that she worries about her husband because she says that he abuses  his pain medicine. On 8 negative thinking. Chronic nightmares and poor sleep. She denies having any current hallucinations or psychotic symptoms. She goes to [facility] and says that she is compliant with her medicine. Denies that she's been drinking or using any drugs.  Medical history: Dyslipidemia. Overdose of opiates now recovering  Social history: Does not work outside the home. Lives with her husband. Hasn't apparently some chronic conflict there.  Substance abuse history: Reports that she used to drink heavily but hasn't had any alcohol in 14 years.  Past Psychiatric History: Multiple prior suicide attempts multiple prior hospitalizations. Followed up by a therapist and psychiatrist at [facility]. Currently on Prozac, lamotrigine, Seroquel and Xanax. No known history of ECT. Multiple prior medicines in the past without consistent benefit.  Risk to Self: Suicidal Ideation: Yes-Currently Present Suicidal Intent: Yes-Currently Present Is patient at risk for suicide?: Yes Suicidal Plan?: Yes-Currently Present Specify Current Suicidal Plan: Pt cut her wrists and took an unknwon amount of pain pills Access to Means: Yes Specify Access to Suicidal Means: Pt has access to medications and knives What has been your use of drugs/alcohol within the last 12 months?: Alcohol How many times?: 2 Other Self Harm Risks: None reported Triggers for Past Attempts: Unknown Intentional Self Injurious Behavior: Cutting Comment - Self Injurious Behavior: Pt has a history of cutting her wrists Risk to Others: Homicidal Ideation: No Thoughts of Harm to Others: No Current Homicidal Intent: No Current Homicidal Plan: No Access to Homicidal Means: No Identified Victim: None identified History of harm to others?: No Assessment of Violence: None Noted Violent Behavior Description: None reported Does patient have access to weapons?: No Criminal Charges Pending?: No Does  patient have a court date: No Prior Inpatient  Therapy: Prior Inpatient Therapy: Yes Prior Therapy Dates: [place] Prior Therapy Facilty/Provider(s): 2012, 2017 Reason for Treatment: depression Prior Outpatient Therapy: Prior Outpatient Therapy: Yes Prior Therapy Dates: Current Prior Therapy Facilty/Provider(s): [place]  Reason for Treatment: Depression and alcohol use  Past Medical History:  Past Medical History:  Diagnosis Date  . Alcohol abuse unk  . Anxiety unk  . Aspiration into airway   . Depression unk    Past Surgical History:  Procedure Laterality Date  . ABDOMINAL HYSTERECTOMY    . FOOT SURGERY    . TRACHEAL SURGERY     Family History:  Family History  Problem Relation Age of Onset  . Breast cancer Mother   . Hypertension Mother   . Depression Mother   . Anxiety disorder Mother   . Alcohol abuse Father   . Alcohol abuse Sister   . Anxiety disorder Sister   . Bipolar disorder Sister    Family Psychiatric  History: Mother had major depression as well Social History:  History  Alcohol Use No    Comment: former alcoholic "sober for a month now"     History  Drug Use No    Social History   Social History  . Marital status: Married    Spouse name: N/A  . Number of children: N/A  . Years of education: N/A   Social History Main Topics  . Smoking status: Former Smoker    Types: Cigarettes  . Smokeless tobacco: Never Used  . Alcohol use No     Comment: former alcoholic "sober for a month now"  . Drug use: No  . Sexual activity: Not Currently   Other Topics Concern  . None   Social History Narrative  . None   Additional Social History:    Allergies:   Allergies  Allergen Reactions  . Penicillins Hives    Has patient had a PCN reaction causing immediate rash, facial/tongue/throat swelling, SOB or lightheadedness with hypotension: Yes Has patient had a PCN reaction causing severe rash involving mucus membranes or skin necrosis: No Has patient had a PCN reaction  that required hospitalization Yes Has patient had a PCN reaction occurring within the last 10 years: Yes If all of the above answers are "NO", then may proceed with Cephalosporin use.     Labs:  Results for orders placed or performed during the hospital encounter of 10/23/15 (from the past 48 hour(s))  Comprehensive metabolic panel     Status: Abnormal   Collection Time: 10/23/15  4:29 AM  Result Value Ref Range   Sodium 137 135 - 145 mmol/L   Potassium 4.4 3.5 - 5.1 mmol/L   Chloride 101 101 - 111 mmol/L   CO2 28 22 - 32 mmol/L   Glucose, Bld 123 (H) 65 - 99 mg/dL   BUN 8 6 - 20 mg/dL   Creatinine, Ser 0.64 0.44 - 1.00 mg/dL   Calcium 8.9 8.9 - 10.3 mg/dL   Total Protein 7.6 6.5 - 8.1 g/dL   Albumin 4.3 3.5 - 5.0 g/dL   AST 40 15 - 41 U/L   ALT 27 14 - 54 U/L   Alkaline Phosphatase 97 38 - 126 U/L   Total Bilirubin 0.2 (L) 0.3 - 1.2 mg/dL   GFR calc non Af Amer >60 >60 mL/min   GFR calc Af Amer >60 >60 mL/min    Comment: (NOTE) The eGFR has been calculated using the CKD  EPI equation. This calculation has not been validated in all clinical situations. eGFR's persistently <60 mL/min signify possible Chronic Kidney Disease.    Anion gap 8 5 - 15  Ethanol     Status: None   Collection Time: 10/23/15  4:29 AM  Result Value Ref Range   Alcohol, Ethyl (B) <5 <5 mg/dL    Comment:        LOWEST DETECTABLE LIMIT FOR SERUM ALCOHOL IS 5 mg/dL FOR MEDICAL PURPOSES ONLY   Salicylate level     Status: None   Collection Time: 10/23/15  4:29 AM  Result Value Ref Range   Salicylate Lvl <2.7 2.8 - 30.0 mg/dL  Acetaminophen level     Status: Abnormal   Collection Time: 10/23/15  4:29 AM  Result Value Ref Range   Acetaminophen (Tylenol), Serum <10 (L) 10 - 30 ug/mL    Comment:        THERAPEUTIC CONCENTRATIONS VARY SIGNIFICANTLY. A RANGE OF 10-30 ug/mL MAY BE AN EFFECTIVE CONCENTRATION FOR MANY PATIENTS. HOWEVER, SOME ARE BEST TREATED AT CONCENTRATIONS OUTSIDE  THIS RANGE. ACETAMINOPHEN CONCENTRATIONS >150 ug/mL AT 4 HOURS AFTER INGESTION AND >50 ug/mL AT 12 HOURS AFTER INGESTION ARE OFTEN ASSOCIATED WITH TOXIC REACTIONS.   cbc     Status: None   Collection Time: 10/23/15  4:29 AM  Result Value Ref Range   WBC 6.1 3.6 - 11.0 K/uL   RBC 4.35 3.80 - 5.20 MIL/uL   Hemoglobin 13.7 12.0 - 16.0 g/dL   HCT 40.4 35.0 - 47.0 %   MCV 92.9 80.0 - 100.0 fL   MCH 31.5 26.0 - 34.0 pg   MCHC 33.9 32.0 - 36.0 g/dL   RDW 13.9 11.5 - 14.5 %   Platelets 227 150 - 440 K/uL  Blood gas, arterial     Status: Abnormal   Collection Time: 10/23/15  4:55 AM  Result Value Ref Range   FIO2 0.21    Delivery systems ROOM AIR    pH, Arterial 7.28 (L) 7.350 - 7.450   pCO2 arterial 63 (H) 32.0 - 48.0 mmHg   pO2, Arterial 69 (L) 83.0 - 108.0 mmHg   Bicarbonate 29.6 (H) 21.0 - 28.0 mEq/L   Acid-Base Excess 1.3 0.0 - 3.0 mmol/L   O2 Saturation 91.0 %   Patient temperature 37.0    Collection site RIGHT RADIAL    Sample type ARTERIAL DRAW    Allens test (pass/fail) PASS PASS  Urine Drug Screen, Qualitative     Status: Abnormal   Collection Time: 10/23/15  5:16 AM  Result Value Ref Range   Tricyclic, Ur Screen POSITIVE (A) NONE DETECTED   Amphetamines, Ur Screen NONE DETECTED NONE DETECTED   MDMA (Ecstasy)Ur Screen NONE DETECTED NONE DETECTED   Cocaine Metabolite,Ur [place] NONE DETECTED NONE DETECTED   Opiate, Ur Screen POSITIVE (A) NONE DETECTED   Phencyclidine (PCP) Ur S NONE DETECTED NONE DETECTED   Cannabinoid 50 Ng, Ur [place] NONE DETECTED NONE DETECTED   Barbiturates, Ur Screen NONE DETECTED NONE DETECTED   Benzodiazepine, Ur Scrn NONE DETECTED NONE DETECTED   Methadone Scn, Ur NONE DETECTED NONE DETECTED    Comment: (NOTE) 253  Tricyclics, urine               Cutoff 1000 ng/mL 200  Amphetamines, urine             Cutoff 1000 ng/mL 300  MDMA (Ecstasy), urine           Cutoff 500 ng/mL 400  Cocaine Metabolite, urine       Cutoff 300 ng/mL 500  Opiate, urine                    Cutoff 300 ng/mL 600  Phencyclidine (PCP), urine      Cutoff 25 ng/mL 700  Cannabinoid, urine              Cutoff 50 ng/mL 800  Barbiturates, urine             Cutoff 200 ng/mL 900  Benzodiazepine, urine           Cutoff 200 ng/mL 1000 Methadone, urine                Cutoff 300 ng/mL 1100 1200 The urine drug screen provides only a preliminary, unconfirmed 1300 analytical test result and should not be used for non-medical 1400 purposes. Clinical consideration and professional judgment should 1500 be applied to any positive drug screen result due to possible 1600 interfering substances. A more specific alternate chemical method 1700 must be used in order to obtain a confirmed analytical result.  1800 Gas chromato graphy / mass spectrometry (GC/MS) is the preferred 1900 confirmatory method.     Current Facility-Administered Medications  Medication Dose Route Frequency Provider Last Rate Last Dose  . ALPRAZolam (XANAX) tablet 1 mg  1 mg Oral TID Gonzella Lex, MD      . atorvastatin (LIPITOR) tablet 20 mg  20 mg Oral q1800 Gonzella Lex, MD      . FLUoxetine (PROZAC) capsule 40 mg  40 mg Oral Daily Gonzella Lex, MD      . lamoTRIgine (LAMICTAL) tablet 200 mg  200 mg Oral Daily John T Clapacs, MD      . QUEtiapine (SEROQUEL) tablet 200 mg  200 mg Oral QHS Gonzella Lex, MD       Current Outpatient Prescriptions  Medication Sig Dispense Refill  . ALPRAZolam (XANAX) 1 MG tablet Take 1 mg by mouth 3 (three) times daily.    Marland Kitchen atorvastatin (LIPITOR) 20 MG tablet Take 20 mg by mouth at bedtime.    Marland Kitchen FLUoxetine (PROZAC) 40 MG capsule Take 2 capsules (80 mg total) by mouth daily. 347 capsule 1  . folic acid (FOLVITE) 1 MG tablet Take 1 tablet (1 mg total) by mouth daily. (Patient taking differently: Take 1 mg by mouth at bedtime. ) 30 tablet 0  . lamoTRIgine (LAMICTAL) 200 MG tablet Take 1 tablet (200 mg total) by mouth daily. 90 tablet 1  . naloxone HCl 4 MG/0.1ML LIQD  administer in case of opioid overdose. 2 each 2  . QUEtiapine (SEROQUEL) 200 MG tablet Take 1 tablet (200 mg total) by mouth at bedtime. 30 tablet 1    Musculoskeletal: Strength & Muscle Tone: decreased Gait & Station: unsteady Patient leans: N/A  Psychiatric Specialty Exam: Physical Exam  Nursing note and vitals reviewed. Constitutional: She appears well-developed and well-nourished.  HENT:  Head: Normocephalic and atraumatic.  Eyes: Conjunctivae are normal. Pupils are equal, round, and reactive to light.  Neck: Normal range of motion.  Cardiovascular: Normal heart sounds.   Respiratory: Effort normal.  GI: Soft.  Musculoskeletal: Normal range of motion.  Neurological: She is alert.  Skin: Skin is warm and dry.  Psychiatric: Her mood appears anxious. Her speech is delayed. She is slowed. Cognition and memory are impaired. She expresses impulsivity. She exhibits a depressed mood. She expresses suicidal ideation.    Review of Systems  Constitutional: Negative.   HENT: Negative.   Eyes: Negative.   Respiratory: Negative.   Cardiovascular: Negative.   Gastrointestinal: Negative.   Musculoskeletal: Negative.   Skin: Negative.   Neurological: Negative.   Psychiatric/Behavioral: Positive for depression, memory loss and suicidal ideas. Negative for hallucinations and substance abuse. The patient is nervous/anxious and has insomnia.     Blood pressure 119/82, pulse 80, temperature 98.4 F (36.9 C), temperature source Oral, resp. rate 19, height _0  (1.626 m), weight 66.2 kg (146 lb), SpO2 (!) 89 %.Body mass index is 25.06 kg/m.  General Appearance: Casual  Eye Contact:  Minimal  Speech:  Slow  Volume:  Decreased  Mood:  Anxious, Depressed and Dysphoric  Affect:  Constricted  Thought Process:  Goal Directed  Orientation:  Full (Time, Place, and Person)  Thought Content:  Logical  Suicidal Thoughts:  Yes.  with intent/plan  Homicidal Thoughts:  No  Memory:  Immediate;    Good Recent;   Fair Remote;   Fair  Judgement:  Impaired  Insight:  Shallow  Psychomotor Activity:  Decreased  Concentration:  Concentration: Poor  Recall:  [employer] of Knowledge:  Fair  Language:  Fair  Akathisia:  No  Handed:  Right  AIMS (if indicated):     Assets:  Communication Skills Desire for Improvement Financial Resources/Insurance Housing Resilience Social Support  ADL's:  Intact  Cognition:  WNL  Sleep:        Treatment Plan Summary: Daily contact with patient to assess and evaluate symptoms and progress in treatment, Medication management and Plan Patient was severe recurrent major depression possible anxiety disorder possible personality disorder. Serious suicide attempt. Continues to be very sad negative and down with suicidal ideation. Patient needs admission to the psychiatric hospital. She has been taken off of oxygen and appears to be medically stable at this point. Vital signs stable. Patient will be admitted to psychiatry and continue her usual psychiatric medicines are now. Full complement of labs and EKG completed.  Disposition: Recommend psychiatric Inpatient admission when medically cleared.  Alethia Berthold, MD 10/23/2015 3:05 PM

## 2015-10-23 NOTE — ED Notes (Signed)
Patient assisted to toilet 

## 2015-10-23 NOTE — BH Assessment (Signed)
Assessment Note  Patricia [name] is an 55 y.o. female presenting to the ED, via EMS, after an apparent suicide attempt.  According to EMS, patient became upset after an argument/discussion in the home.  Pt reportedly took some of her husband's pain pills and cut her wrists.  Patient was still lethargic, drowsy as side effects from the medications she had taking.  However, patient did admit that she intentionally tried to end of life.  She states that she is tired to her husband abusing narcotics.  From a review of the medical record, patient has a history of depression and alcohol use. She has been sober for "close to a year." Prior to that she was abusing alcohol and been in approximately 20 rehab facilities. "She use to drink until she passed out."   She has also had several admissions to behavioral medicine due to depression and multiple attempts to overdose on medications.  Patient was receiving outpatient treatment with [facility] but was discharged from their services.  She's currently with [facility]. The psychiatrist she was seeing resigned and now she only sees a therapist.   Diagnosis: Depression, Anxiety  Past Medical History:  Past Medical History:  Diagnosis Date  . Alcohol abuse unk  . Anxiety unk  . Aspiration into airway   . Depression unk    Past Surgical History:  Procedure Laterality Date  . ABDOMINAL HYSTERECTOMY    . FOOT SURGERY    . TRACHEAL SURGERY      Family History:  Family History  Problem Relation Age of Onset  . Breast cancer Mother   . Hypertension Mother   . Depression Mother   . Anxiety disorder Mother   . Alcohol abuse Father   . Alcohol abuse Sister   . Anxiety disorder Sister   . Bipolar disorder Sister     Social History:  reports that she has quit smoking. Her smoking use included Cigarettes. She has never used smokeless tobacco. She reports that she does not drink alcohol or use  drugs.  Additional Social History:  Alcohol / Drug Use History of alcohol / drug use?: Yes Longest period of sobriety (when/how long): 14 years Negative Consequences of Use: Personal relationships, Surveyor, quantity, Work / School Substance #1 Name of Substance 1: Alcohol 1 - Age of First Use: 18 1 - Amount (size/oz): "until I pass out" 1 - Frequency: everyday 1 - Duration: varies 1 - Last Use / Amount: one year ago  CIWA: CIWA-Ar BP: 120/72 Pulse Rate: 92 COWS:    Allergies:  Allergies  Allergen Reactions  . Penicillins Hives    Has patient had a PCN reaction causing immediate rash, facial/tongue/throat swelling, SOB or lightheadedness with hypotension: Yes Has patient had a PCN reaction causing severe rash involving mucus membranes or skin necrosis: No Has patient had a PCN reaction that required hospitalization Yes Has patient had a PCN reaction occurring within the last 10 years: Yes If all of the above answers are "NO", then may proceed with Cephalosporin use.     Home Medications:  (Not in a hospital admission)  OB/GYN Status:  No LMP recorded. Patient has had a hysterectomy.  General Assessment Data Location of Assessment: [facility] ED TTS Assessment: In system Is this a Tele or Face-to-Face Assessment?: Face-to-Face Is this an Initial Assessment or a Re-assessment for this encounter?: Initial Assessment Marital status: Married [place] name: Melino Is patient pregnant?: No Pregnancy Status: No Living Arrangements: Spouse/significant other, Children (and son's girlfriend)  Can pt return to current living arrangement?: Yes Admission Status: Involuntary Is patient capable of signing voluntary admission?: No Referral Source: Self/Family/Friend Insurance type: BCBS  Medical Screening Exam [facility] Walk-in ONLY) Medical Exam completed: Yes  Crisis Care Plan Living Arrangements: Spouse/significant other, Children (and son's girlfriend) Legal Guardian: Other: (self) Name of  Psychiatrist: None at this time Name of Therapist: [facility]  Education Status Is patient currently in school?: No Current Grade: n/a Highest grade of school patient has completed: 12th Grade Name of school: n/a Contact person: n/a  Risk to self with the past 6 months Suicidal Ideation: Yes-Currently Present Has patient been a risk to self within the past 6 months prior to admission? : Yes (Unknown, patient didn't answer) Suicidal Intent: Yes-Currently Present Has patient had any suicidal intent within the past 6 months prior to admission? : Yes (Unknown, patient didn't answer) Is patient at risk for suicide?: Yes Suicidal Plan?: Yes-Currently Present Has patient had any suicidal plan within the past 6 months prior to admission? : Yes (Unknown, patient didn't answer) Specify Current Suicidal Plan: Pt cut her wrists and took an unknwon amount of pain pills Access to Means: Yes Specify Access to Suicidal Means: Pt has access to medications and knives What has been your use of drugs/alcohol within the last 12 months?: Alcohol Previous Attempts/Gestures: Yes How many times?: 2 Other Self Harm Risks: None reported Triggers for Past Attempts: Unknown Intentional Self Injurious Behavior: Cutting Comment - Self Injurious Behavior: Pt has a history of cutting her wrists Family Suicide History: Unknown Recent stressful life event(s): Other (Comment) Persecutory voices/beliefs?: No Depression: Yes Depression Symptoms: Loss of interest in usual pleasures, Feeling worthless/self pity Substance abuse history and/or treatment for substance abuse?: Yes Suicide prevention information given to non-admitted patients: Not applicable  Risk to Others within the past 6 months Homicidal Ideation: No Does patient have any lifetime risk of violence toward others beyond the six months prior to admission? : No Thoughts of Harm to Others: No Current Homicidal Intent: No Current  Homicidal Plan: No Access to Homicidal Means: No Identified Victim: None identified History of harm to others?: No Assessment of Violence: None Noted Violent Behavior Description: None reported Does patient have access to weapons?: No Criminal Charges Pending?: No Does patient have a court date: No Is patient on probation?: No  Psychosis Hallucinations: None noted Delusions: None noted  Mental Status Report Appearance/Hygiene: Unremarkable, In hospital gown Eye Contact: Poor Motor Activity: Freedom of movement Speech: Slurred, Slow, Soft, Incoherent Level of Consciousness: Drowsy, Unable to assess Mood: Depressed, Pleasant Affect: Anxious, Frightened, Other (Comment) Anxiety Level: Moderate Thought Processes: Flight of Ideas Judgement: Impaired Orientation: Person, Appropriate for developmental age Obsessive Compulsive Thoughts/Behaviors: None  Cognitive Functioning Concentration: Decreased Memory: Recent Impaired, Remote Impaired IQ: Average Insight: Poor Impulse Control: Poor Appetite: Fair Weight Loss: 0 Weight Gain: 0 Sleep: No Change Total Hours of Sleep: 8 Vegetative Symptoms: None  ADLScreening [facility] Assessment Services) Patient's cognitive ability adequate to safely complete daily activities?: Yes Patient able to express need for assistance with ADLs?: Yes Independently performs ADLs?: Yes (appropriate for developmental age)  Prior Inpatient Therapy Prior Inpatient Therapy: Yes Prior Therapy Dates: [facility] Prior Therapy Facilty/Provider(s): 2012, 2017 Reason for Treatment: depression  Prior Outpatient Therapy Prior Outpatient Therapy: Yes Prior Therapy Dates: Current Prior Therapy Facilty/Provider(s): [facility]  Reason for Treatment: Depression and alcohol use  ADL Screening (condition at time of admission) Patient's cognitive ability adequate to safely  complete daily activities?: Yes Patient able to express need for  assistance with ADLs?: Yes Independently performs ADLs?: Yes (appropriate for developmental age)       Abuse/Neglect Assessment (Assessment to be complete while patient is alone) Physical Abuse: Denies Verbal Abuse: Denies Sexual Abuse: Denies Exploitation of patient/patient's resources: Denies Self-Neglect: Denies Values / Beliefs Cultural Requests During Hospitalization: None Spiritual Requests During Hospitalization: None Consults Spiritual Care Consult Needed: No Social Work Consult Needed: No      Additional Information 1:1 In Past 12 Months?: No CIRT Risk: No Elopement Risk: No Does patient have medical clearance?: No     Disposition:  Disposition Initial Assessment Completed for this Encounter: Yes Disposition of Patient: Other dispositions (ER MD ordered Psych Consult) Other disposition(s): Other (Comment) (Pending Psych MD consult)  On Site Evaluation by:   Reviewed with Physician:    Artist Beachoxana C Aster Eckrich 10/23/2015 6:54 AM

## 2015-10-23 NOTE — ED Notes (Signed)

## 2015-10-23 NOTE — ED Provider Notes (Signed)
[facility] Emergency Department Provider Note   ____________________________________________   First MD Initiated Contact with Patient 10/23/15 0430     (approximate)  I have reviewed the triage vital signs and the nursing notes.   HISTORY  Chief Complaint Suicide Attempt; Laceration; and Ingestion  Limited by drowsiness  HPI Patricia [name] is a 55 y.o. female brought to the ED from home via EMS status post suicide attempts. EMS reports patient was arguing with a family member earlier. She was upset and subsequently cut her left wrist. Allegedly her husband watched as she took some pills and cut herself approximately one hour prior to arrival. It is unknown what she took. Patient states they were her husband's "pain pills". Review of records show that patient recently hadan intentional overdose on pain pills and Ambien. Rest of history is unobtainable secondary to patient's drowsiness.   Past Medical History:  Diagnosis Date  . Alcohol abuse unk  . Anxiety unk  . Aspiration into airway   . Depression unk    Patient Active Problem List   Diagnosis Date Noted  . Opiate overdose 09/28/2015  . Stimulant use disorder (HCC) 09/28/2015  . Involuntary commitment 09/28/2015  . Major depressive disorder, recurrent severe without psychotic features (HCC) 09/28/2015  . Opioid use disorder, moderate, dependence (HCC) 09/28/2015  . Anxiety disorder 05/01/2015  . Allergic rhinitis 03/04/2014  . 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 Abnormal toxicological findings 10/22/2013  . ADD (attention deficit disorder) 09/19/2013  . H/O eating disorder 09/19/2013  . H/O alcohol abuse 09/19/2013    Past Surgical History:  Procedure Laterality Date  . ABDOMINAL HYSTERECTOMY    . FOOT SURGERY    . TRACHEAL SURGERY      Prior to Admission medications   Medication Sig Start Date End Date Taking? Authorizing Provider  ALPRAZolam Prudy Feeler) 1 MG  tablet Take 1 mg by mouth 3 (three) times daily.   Yes Historical Provider, MD  atorvastatin (LIPITOR) 20 MG tablet Take 20 mg by mouth at bedtime.   Yes Historical Provider, MD  FLUoxetine (PROZAC) 40 MG capsule Take 2 capsules (80 mg total) by mouth daily. 09/29/15  Yes Jolanta B Pucilowska, MD  folic acid (FOLVITE) 1 MG tablet Take 1 tablet (1 mg total) by mouth daily. Patient taking differently: Take 1 mg by mouth at bedtime.  02/08/15  Yes Altamese Dilling, MD  lamoTRIgine (LAMICTAL) 200 MG tablet Take 1 tablet (200 mg total) by mouth daily. 09/29/15  Yes Jolanta B Pucilowska, MD  naloxone HCl 4 MG/0.1ML LIQD administer in case of opioid overdose. 09/30/15  Yes Jolanta B Pucilowska, MD  QUEtiapine (SEROQUEL) 200 MG tablet Take 1 tablet (200 mg total) by mouth at bedtime. 09/30/15  Yes Shari Prows, MD    Allergies Penicillins  Family History  Problem Relation Age of Onset  . Breast cancer Mother   . Hypertension Mother   . Depression Mother   . Anxiety disorder Mother   . Alcohol abuse Father   . Alcohol abuse Sister   . Anxiety disorder Sister   . Bipolar disorder Sister     Social History Social History  Substance Use Topics  . Smoking status: Former Smoker    Types: Cigarettes  . Smokeless tobacco: Never Used  . Alcohol use No     Comment: former alcoholic "sober for a month now"    Review of Systems  Constitutional: No fever/chills. Eyes: No visual changes. ENT: No sore throat.  Cardiovascular: Denies chest pain. Respiratory: Denies shortness of breath. Gastrointestinal: No abdominal pain.  No nausea, no vomiting.  No diarrhea.  No constipation. Genitourinary: Negative for dysuria. Musculoskeletal: Positive for left wrist laceration. Negative for back pain. Skin: Negative for rash. Neurological: Negative for headaches, focal weakness or numbness. Psychiatric:Positive for depression with suicide attempt.  10-point ROS otherwise  negative.  ____________________________________________   PHYSICAL EXAM:  VITAL SIGNS: ED Triage Vitals  Enc Vitals Group     BP 10/23/15 0411 136/80     Pulse Rate 10/23/15 0411 94     Resp 10/23/15 0411 18     Temp 10/23/15 0411 98.4 F (36.9 C)     Temp Source 10/23/15 0411 Oral     SpO2 10/23/15 0405 96 %     Weight 10/23/15 0411 146 lb (66.2 kg)     Height 10/23/15 0411 5\' 4"  (1.626 m)     Head Circumference --      Peak Flow --      Pain Score 10/23/15 0414 Asleep     Pain Loc --      Pain Edu? --      Excl. in GC? --     Constitutional: Drowsy; awakens to voice. Disheveled appearing and in mild acute distress. Eyes: Conjunctivae are normal. PERRL. EOMI. Head: Atraumatic. Nose: No congestion/rhinnorhea. Mouth/Throat: Mucous membranes are moist.  Oropharynx non-erythematous. Neck: No stridor.  No cervical spine tenderness to palpation. Cardiovascular: Normal rate, regular rhythm. Grossly normal heart sounds.  Good peripheral circulation. Respiratory: Normal respiratory effort.  No retractions. Lungs CTAB. Gastrointestinal: Soft and nontender. No distention. No abdominal bruits. No CVA tenderness. Musculoskeletal: Approximately 3 cm curved laceration to the ventral left wrist. Tendon visualized which appears intact. Motor strength and sensation intact. No active bleeding. No lower extremity tenderness nor edema.  No joint effusions. Neurologic:  Slurred, intoxicated speech and language. No gross focal neurologic deficits are appreciated.  Skin:  Skin is warm, dry and intact. No rash noted. Psychiatric: Mood and affect are flat. Speech and behavior are flat.  ____________________________________________   LABS (all labs ordered are listed, but only abnormal results are displayed)  Labs Reviewed  COMPREHENSIVE METABOLIC PANEL - Abnormal; Notable for the following:       Result Value   Glucose, Bld 123 (*)    Total Bilirubin 0.2 (*)    All other components within  normal limits  ACETAMINOPHEN LEVEL - Abnormal; Notable for the following:    Acetaminophen (Tylenol), Serum <10 (*)    All other components within normal limits  URINE DRUG SCREEN, QUALITATIVE ([facility] ONLY) - Abnormal; Notable for the following:    Tricyclic, Ur Screen POSITIVE (*)    Opiate, Ur Screen POSITIVE (*)    All other components within normal limits  BLOOD GAS, ARTERIAL - Abnormal; Notable for the following:    pH, Arterial 7.28 (*)    pCO2 arterial 63 (*)    pO2, Arterial 69 (*)    Bicarbonate 29.6 (*)    All other components within normal limits  ETHANOL  SALICYLATE LEVEL  CBC  CBG MONITORING, ED   ____________________________________________  EKG  ED ECG REPORT I, Anne Boltz J, the attending physician, personally viewed and interpreted this ECG.   Date: 10/23/2015  EKG Time: 0410  Rate: 90  Rhythm: normal EKG, normal sinus rhythm  Axis: Normal  Intervals:none  ST&T Change: Nonspecific  ____________________________________________  RADIOLOGY  Portable chest x-ray (viewed by me, interpreted per Dr. Harrie JeansStratton): Negative chest. ____________________________________________  PROCEDURES  Procedure(s) performed: None  Procedures  Critical Care performed: No  ____________________________________________   INITIAL IMPRESSION / ASSESSMENT AND PLAN / ED COURSE  Pertinent labs & imaging results that were available during my care of the patient were reviewed by me and considered in my medical decision making (see chart for details).  55 year old female with a history of depression and intentional overdose who will arrives to the ED, drowsy after apparent suicide attempt with unknown ingestion. Review of records reveal patient likely ingested pain medicine and perhaps sleeping pills. She adamantly refuses suture repair of her laceration. Nursing to cleanse wound and I will attempt repair with Dermabond and Steri-Strips.  Clinical Course  Comment By Time   Patient is now more awake. She continued to refuse sutures. Wound was Dermabond did and reinforced with Steri-Strips. She was able to drink the charcoal without difficulty. Laboratory and urinalysis results noted. Patient will remain in the emergency department under IVC pending psychiatry evaluation today. Irean Hong, MD 08/25 [phone]     ____________________________________________   FINAL CLINICAL IMPRESSION(S) / ED DIAGNOSES  Final diagnoses:  Laceration  Overdose, intentional self-harm, initial encounter (HCC)  Depression  Suicide attempt [facility])      NEW MEDICATIONS STARTED DURING THIS VISIT:  New Prescriptions   No medications on file     Note:  This document was prepared using Dragon voice recognition software and may include unintentional dictation errors.    Irean Hong, MD 10/23/15 [phone]

## 2015-10-23 NOTE — ED Notes (Signed)
Patient has several lacerations on the wrist that show no signs of infection. Nurse applied clean dry bandage to wrist. Will monitor for increased bleeding/ drainage.

## 2015-10-23 NOTE — ED Notes (Signed)
Report was received from Karena T., RN; Pt. Verbalizes no complaints or distress; verbalizes having S.I.; denies havingHi. Continue to monitor with 15 min. Monitoring. 

## 2015-10-24 ENCOUNTER — Inpatient Hospital Stay
Admission: EM | Admit: 2015-10-24 | Discharge: 2015-10-30 | DRG: 885 | Disposition: A | Discharge status: Home / Self Care | Payer: BLUE CROSS/BLUE SHIELD | Source: Ambulatory Visit | Attending: Psychiatry | Admitting: Psychiatry

## 2015-10-24 DIAGNOSIS — R45851 Suicidal ideations: Secondary | ICD-10-CM | POA: Diagnosis present

## 2015-10-24 DIAGNOSIS — F1021 Alcohol dependence, in remission: Secondary | ICD-10-CM | POA: Diagnosis present

## 2015-10-24 DIAGNOSIS — Z818 Family history of other mental and behavioral disorders: Secondary | ICD-10-CM | POA: Diagnosis not present

## 2015-10-24 DIAGNOSIS — Z811 Family history of alcohol abuse and dependence: Secondary | ICD-10-CM

## 2015-10-24 DIAGNOSIS — Z79899 Other long term (current) drug therapy: Secondary | ICD-10-CM

## 2015-10-24 DIAGNOSIS — X789XXA Intentional self-harm by unspecified sharp object, initial encounter: Secondary | ICD-10-CM | POA: Diagnosis present

## 2015-10-24 DIAGNOSIS — Z87891 Personal history of nicotine dependence: Secondary | ICD-10-CM

## 2015-10-24 DIAGNOSIS — Z90710 Acquired absence of both cervix and uterus: Secondary | ICD-10-CM | POA: Diagnosis not present

## 2015-10-24 DIAGNOSIS — T40601A Poisoning by unspecified narcotics, accidental (unintentional), initial encounter: Secondary | ICD-10-CM | POA: Diagnosis present

## 2015-10-24 DIAGNOSIS — G2581 Restless legs syndrome: Secondary | ICD-10-CM | POA: Diagnosis present

## 2015-10-24 DIAGNOSIS — Z8659 Personal history of other mental and behavioral disorders: Secondary | ICD-10-CM

## 2015-10-24 DIAGNOSIS — Z803 Family history of malignant neoplasm of breast: Secondary | ICD-10-CM

## 2015-10-24 DIAGNOSIS — T40602A Poisoning by unspecified narcotics, intentional self-harm, initial encounter: Secondary | ICD-10-CM | POA: Diagnosis present

## 2015-10-24 DIAGNOSIS — Z915 Personal history of self-harm: Secondary | ICD-10-CM

## 2015-10-24 DIAGNOSIS — Z8249 Family history of ischemic heart disease and other diseases of the circulatory system: Secondary | ICD-10-CM

## 2015-10-24 DIAGNOSIS — E785 Hyperlipidemia, unspecified: Secondary | ICD-10-CM | POA: Diagnosis present

## 2015-10-24 DIAGNOSIS — Z9889 Other specified postprocedural states: Secondary | ICD-10-CM

## 2015-10-24 DIAGNOSIS — F3181 Bipolar II disorder: Secondary | ICD-10-CM | POA: Diagnosis present

## 2015-10-24 DIAGNOSIS — Z880 Allergy status to penicillin: Secondary | ICD-10-CM

## 2015-10-24 DIAGNOSIS — F332 Major depressive disorder, recurrent severe without psychotic features: Secondary | ICD-10-CM | POA: Diagnosis present

## 2015-10-24 DIAGNOSIS — S61512A Laceration without foreign body of left wrist, initial encounter: Secondary | ICD-10-CM | POA: Diagnosis present

## 2015-10-24 LAB — LIPID PANEL
CHOLESTEROL: 198 mg/dL (ref 0–200)
HDL: 85 mg/dL (ref >40)
LDL CALC: 99 mg/dL (ref 0–99)
TRIGLYCERIDES: 72 mg/dL (ref <150)
Total CHOL/HDL Ratio: 2.3 RATIO
VLDL: 14 mg/dL (ref 0–40)

## 2015-10-24 LAB — HEMOGLOBIN A1C: Hgb A1c MFr Bld: 4.9 % (ref 4.0–6.0)

## 2015-10-24 LAB — TSH: TSH: 3.093 u[IU]/mL (ref 0.350–4.500)

## 2015-10-24 MED ORDER — BACITRACIN ZINC 500 UNIT/GM EX OINT
TOPICAL_OINTMENT | Freq: Every day | CUTANEOUS | Status: DC
  Administered 2015-10-24: 12:00:00 via TOPICAL
  Administered 2015-10-25 – 2015-10-26 (×2): 1 via TOPICAL
  Administered 2015-10-27 – 2015-10-28 (×2): via TOPICAL
  Administered 2015-10-29: 1 via TOPICAL
  Administered 2015-10-30: 11:00:00 via TOPICAL
  Filled 2015-10-24 (×7): qty 0.9

## 2015-10-24 MED ORDER — ACETAMINOPHEN 325 MG PO TABS: 650.0000 mg | ORAL_TABLET | Freq: Four times a day (QID) | ORAL | Status: DC | PRN

## 2015-10-24 MED ORDER — ATORVASTATIN CALCIUM 20 MG PO TABS
20.0000 mg | ORAL_TABLET | Freq: Every day | ORAL | Status: DC
  Administered 2015-10-24 – 2015-10-29 (×6): 20 mg via ORAL
  Filled 2015-10-24 (×6): qty 1

## 2015-10-24 MED ORDER — QUETIAPINE FUMARATE 200 MG PO TABS: 200.0000 mg | ORAL_TABLET | Freq: Every day | ORAL | Status: DC

## 2015-10-24 MED ORDER — ALUM & MAG HYDROXIDE-SIMETH 200-200-20 MG/5ML PO SUSP: 30.0000 mL | ORAL | Status: DC | PRN

## 2015-10-24 MED ORDER — QUETIAPINE FUMARATE 100 MG PO TABS: 100.0000 mg | ORAL_TABLET | Freq: Every day | ORAL | Status: DC

## 2015-10-24 MED ORDER — TRAZODONE HCL 50 MG PO TABS
150.0000 mg | ORAL_TABLET | Freq: Every evening | ORAL | Status: DC | PRN
  Administered 2015-10-24 – 2015-10-25 (×2): 150 mg via ORAL
  Filled 2015-10-24 (×2): qty 1

## 2015-10-24 MED ORDER — LAMOTRIGINE 100 MG PO TABS
200.0000 mg | ORAL_TABLET | Freq: Every day | ORAL | Status: DC
  Administered 2015-10-24 – 2015-10-30 (×7): 200 mg via ORAL
  Filled 2015-10-24 (×7): qty 2

## 2015-10-24 MED ORDER — MAGNESIUM HYDROXIDE 400 MG/5ML PO SUSP: 30.0000 mL | Freq: Every day | ORAL | Status: DC | PRN

## 2015-10-24 MED ORDER — CLINDAMYCIN HCL 150 MG PO CAPS
300.0000 mg | ORAL_CAPSULE | Freq: Four times a day (QID) | ORAL | Status: DC
  Administered 2015-10-24 – 2015-10-30 (×25): 300 mg via ORAL
  Filled 2015-10-24 (×25): qty 2

## 2015-10-24 MED ORDER — ALPRAZOLAM 1 MG PO TABS
1.0000 mg | ORAL_TABLET | Freq: Three times a day (TID) | ORAL | Status: DC
  Administered 2015-10-24: 1 mg via ORAL
  Filled 2015-10-24: qty 1

## 2015-10-24 MED ORDER — FLUOXETINE HCL 20 MG PO CAPS
40.0000 mg | ORAL_CAPSULE | Freq: Every day | ORAL | Status: DC
  Administered 2015-10-24 – 2015-10-28 (×5): 40 mg via ORAL
  Filled 2015-10-24 (×5): qty 2

## 2015-10-24 MED ORDER — CHLORDIAZEPOXIDE HCL 25 MG PO CAPS
25.0000 mg | ORAL_CAPSULE | Freq: Three times a day (TID) | ORAL | Status: DC
  Administered 2015-10-24 – 2015-10-26 (×7): 25 mg via ORAL
  Filled 2015-10-24 (×7): qty 1

## 2015-10-24 NOTE — [facility] Suicide Risk Assessment (Signed)
[facility]   Nursing information obtained from:  Patient, Review of record Demographic factors:  NA Current Mental Status:  Self-harm behaviors Loss Factors:  NA Historical Factors:  Prior suicide attempts Risk Reduction Factors:  Responsible for children under 54 years of age, Sense of responsibility to family, Living with another person, especially a relative  Total Time spent with patient: 1 hour Principal Problem: Major depressive disorder, recurrent severe without psychotic features (HCC) Diagnosis:   Patient Active Problem List   Diagnosis Date Noted  . Alcohol use disorder, moderate, in sustained remission (HCC) [F10.21] 10/24/2015  . Opiate overdose [T40.601A] 09/28/2015  . 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 Major depressive disorder, recurrent severe without psychotic features (HCC) [F33.2] 09/28/2015  . Opioid use disorder, moderate, dependence (HCC) [F11.20] 09/28/2015  . 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 H/O eating disorder [Z86.59] 09/19/2013   Subjective Data:   Continued Clinical Symptoms:  Alcohol Use Disorder Identification Test Final Score (AUDIT): 2 The "Alcohol Use Disorders Identification Test", Guidelines for Use in Primary Care, Second Edition.  World Science writerHealth Organization [facility](WHO). Score between 0-7:  no or low risk or alcohol related problems. Score between 8-15:  moderate risk of alcohol related problems. Score between 16-19:  high risk of alcohol related problems. Score 20 or above:  warrants further diagnostic evaluation for alcohol dependence and treatment.   CLINICAL FACTORS:   Depression:   Impulsivity Alcohol/Substance Abuse/Dependencies Personality Disorders:   Cluster B Comorbid depression Previous Psychiatric Diagnoses and Treatments     Psychiatric Specialty Exam: Physical Exam  ROS  Blood pressure 106/68, pulse 84, temperature 99.1 F (37.3 C), temperature  source Oral, resp. rate 18, height 5\' 5"  (1.651 m), weight 67.1 kg (148 lb), SpO2 99 %.Body mass index is 24.63 kg/m.                                                    Sleep:  Number of Hours: 4.25      COGNITIVE FEATURES THAT CONTRIBUTE TO RISK:  None    SUICIDE RISK:   Severe:  Frequent, intense, and enduring suicidal ideation, specific plan, no subjective intent, but some objective markers of intent (i.e., choice of lethal method), the method is accessible, some limited preparatory behavior, evidence of impaired self-control, severe dysphoria/symptomatology, multiple risk factors present, and few if any protective factors, particularly a lack of social support.   [facility]  I certify that inpatient services furnished can reasonably be expected to improve the patient's condition.  Jimmy FootmanHernandez-Gonzalez,  Elayjah Chaney, MD 10/24/2015, 11:18 AM

## 2015-10-24 NOTE — Progress Notes (Signed)
Patient ID: Patricia [name], female   DOB: 09/05/1960, 55 y.o.   MRN: [id number]  [facility] Pt is drowsy d/t nighttime medications given prior to arrival. Pt gait is unsteady d/t drowsiness. Skin assessment performed and no contraband found. No skin abnormalities noted. Pt assisted to her room. Pt denies SI/HI/AVH at this time, stating "not when I'm this tired." Pt does report that she was attempting to commit suicide by overdosing. She provides minimal information d/t drowsiness. Admission information obtained from review of chart. q15 minute safety checks maintained. Pt remains free from harm. Will continue to monitor.

## 2015-10-24 NOTE — [facility] Group Notes (Signed)
[facility] Group Notes:  (Nursing/MHT/Case Management/Adjunct)  Date:  10/24/2015  Time:  5:18 PM  Type of Therapy:  Psychoeducational Skills  Participation Level:  Did Not Attend   Noelle PennerKristen J Rahmir Beever 10/24/2015, 5:18 PM

## 2015-10-24 NOTE — [facility] Group Notes (Signed)
[facility] LCSW Group Therapy  10/24/2015 2:59 PM  Type of Therapy:  Group Therapy  Participation Level:  None  Participation Quality:  Drowsy and Inattentive  Affect:  Flat, Labile and Lethargic  Cognitive:  Disorganized, Confused and Lacking  Insight:  None  Engagement in Therapy:  None  Modes of Intervention:  Activity, Discussion, Education and Support  Summary of Progress/Problems:Communications: Patients identify how individuals communicate with one another appropriately and inappropriately. Patients will be guided to discuss their thoughts, feelings, and behaviors related to barriers when communicating. The group will process together ways to execute positive and appropriate communications.  Pt had difficulty remaining awake during group and constantly went in and out of sleep. Pt was confused and did not understand the group discussion. CSW provided support to patient and attempted to keep patient engaged. Pt did not participate in the group discussion.   Patricia [name] G. Garnette CzechSampson MSW, LCSWA 10/24/2015, 3:02 PM

## 2015-10-24 NOTE — Progress Notes (Signed)
Pt refused EKG - RN notified °

## 2015-10-24 NOTE — Tx Team (Signed)
Initial Treatment Plan 10/24/2015 1:00 AM Patricia [name] [id number]    PATIENT STRESSORS: Marital or family conflict Substance abuse   PATIENT STRENGTHS: Capable of independent living General fund of knowledge Supportive family/friends   PATIENT IDENTIFIED PROBLEMS: Depression  Suicidal Ideation  Substance abuse                 DISCHARGE CRITERIA:  Improved stabilization in mood, thinking, and/or behavior Motivation to continue treatment in a less acute level of care Need for constant or close observation no longer present Verbal commitment to aftercare and medication compliance  PRELIMINARY DISCHARGE PLAN: Attend aftercare/continuing care group Outpatient therapy Participate in family therapy Return to previous living arrangement  PATIENT/FAMILY INVOLVEMENT: This treatment plan has been presented to and reviewed with the patient, Patricia [name], and/or family member.  The patient and family have been given the opportunity to ask questions and make suggestions.  Beckie BusingMichelle L Parsells, RN 10/24/2015, 1:00 AM

## 2015-10-24 NOTE — Evaluation (Signed)
Physical Therapy Evaluation Patient Details Name: Patricia [name] MRN: [id number] DOB: 25-Jan-1961 Today's Date: 10/24/2015   History of Present Illness  55 y/o female admitted to behavioral med with severe depression.   Clinical Impression  PT requested secondary to pt unsteadiness and possible need for AD.  Pt did have occasional small stagger steps and veering but had no overt LOBs during ~750 ft of ambulation and ultimately did not appear to have high fall risk limitations.  She was able to do SLS, narrow base of support standing with eyes closed, had appropriate reach test and was able to maintain balance with moderate perturbations in standing.  Pt showed no interest in using AD and was safe w/o it.  Pt does not require further PT follow up.   Follow Up Recommendations No PT follow up    Equipment Recommendations   (discussed possible use of SPC if she feels unsteady at home)    Recommendations for Other Services       Precautions / Restrictions Precautions Precautions: Fall Restrictions Weight Bearing Restrictions: No      Mobility  Bed Mobility Overal bed mobility: Independent                Transfers Overall transfer level: Independent Equipment used: None             General transfer comment: Pt is able to rise to standing w/o direct assist  Ambulation/Gait Ambulation/Gait assistance: Supervision Ambulation Distance (Feet): 750 Feet Assistive device: None       General Gait Details: Pt did have occasionally side shuffle/stagger steps but overall as safe and had no LOBs that were not easily self arrested.    Stairs            Wheelchair Mobility    Modified Rankin (Stroke Patients Only)       Balance Overall balance assessment: Modified Independent (NBOS eyes closed X 10 sec, SLS X 5-10 sec, >10" reach)                                           Pertinent Vitals/Pain Pain Assessment: No/denies pain    Home Living  Family/patient expects to be discharged to:: Private residence Living Arrangements: Spouse/significant other;Children   Type of Home: House Home Access: Stairs to enter Entrance Stairs-Rails:  (reports she has rails) Secretary/administrator of Steps: 8          Prior Function Level of Independence: Independent         Comments: Pt reports she didn't like to get out of the house much but was able to if needed     Hand Dominance        Extremity/Trunk Assessment   Upper Extremity Assessment: Overall WFL for tasks assessed           Lower Extremity Assessment: Overall WFL for tasks assessed         Communication   Communication: No difficulties  Cognition Arousal/Alertness: Lethargic Behavior During Therapy: WFL for tasks assessed/performed (pt does appear depressed) Overall Cognitive Status: Within Functional Limits for tasks assessed                      General Comments General comments (skin integrity, edema, etc.): Pt able to maintain balance with eyes closed static standing with moderate perturbations    Exercises  Assessment/Plan    PT Assessment Patent does not need any further PT services  PT Diagnosis Difficulty walking;Generalized weakness   PT Problem List    PT Treatment Interventions     PT Goals (Current goals can be found in the Care Plan section) Acute Rehab PT Goals Patient Stated Goal: none stated    Frequency     Barriers to discharge        Co-evaluation               End of Session Equipment Utilized During Treatment: Gait belt Activity Tolerance: Patient tolerated treatment well Patient left: in bed      Functional Assessment Tool Used: clinical judgement Functional Limitation: Mobility: Walking and moving around Mobility: Walking and Moving Around Current Status ([id number]): At least 1 percent but less than 20 percent impaired, limited or restricted Mobility: Walking and Moving Around Goal Status  [phone]([id number]): At least 1 percent but less than 20 percent impaired, limited or restricted Mobility: Walking and Moving Around Discharge Status [phone]): At least 1 percent but less than 20 percent impaired, limited or restricted    Time: [id number] PT Time Calculation (min) (ACUTE ONLY): 15 min   Charges:   PT Evaluation $PT Eval Low Complexity: 1 Procedure     PT G Codes:   PT G-Codes **NOT FOR INPATIENT CLASS** Functional Assessment Tool Used: clinical judgement Functional Limitation: Mobility: Walking and moving around Mobility: Walking and Moving Around Current Status ([id number](G8978): At least 1 percent but less than 20 percent impaired, limited or restricted Mobility: Walking and Moving Around Goal Status [phone](G8979): At least 1 percent but less than 20 percent impaired, limited or restricted Mobility: Walking and Moving Around Discharge Status [phone]): At least 1 percent but less than 20 percent impaired, limited or restricted    Malachi ProGalen R Pantera Winterrowd, DPT 10/24/2015, 5:13 PM

## 2015-10-24 NOTE — Progress Notes (Addendum)
Patient with depressed affect, slightly confused. Patient pushes wheelchair out of room this am stating she does not want it. Patient with slow and gait unsteady at times. Patient repeatedly refuses fall risk protocol. Educated on falls risk and repeatedly refuses wheel chair use. States "I don't want to". Refuses EKG first time. OceanographerWriter educates on EKG.  Patient forgetful and thinks EKG is going to be a blood draw. Writer stays with patient for EKG. No SI/HI at this time. Writer requests PT consult for evaluate and walker. Minimal interaction with peers. Safety maintained. Patient with self inflicted wound to wrist. Patient refuses steri strips this morning. Writer and MD evaluate wound. Area red, slightly swollen with scant yellow drainage. Bacitracin and Cleocin ordered and administered. Area cleansed prior to dressing application. Writer notifies supply department for additional dressing supplies. Safety maintained.

## 2015-10-24 NOTE — H&P (Signed)
Psychiatric Admission Assessment Adult  [name] MRN:  [id number] Date of Evaluation:  10/24/2015 Chief Complaint:  Major Depression Principal Diagnosis: Major depressive disorder, recurrent severe without psychotic features (HCC) Diagnosis:   Patient Active Problem List   Diagnosis Date Noted  . Alcohol use disorder, moderate, in sustained remission (HCC) [F10.21] 10/24/2015  . Opiate overdose [T40.601A] 09/28/2015  . 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 Major depressive disorder, recurrent severe without psychotic features (HCC) [F33.2] 09/28/2015  . Opioid use disorder, moderate, dependence (HCC) [F11.20] 09/28/2015  . 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 H/O eating disorder [Z86.59] 09/19/2013   History of Present Illness:  Patricia [name] is a 55 y.o. female brought to the ED on 8/25 from home via EMS status post suicide attempts. EMS reports patient was arguing with a family member.. She was upset and subsequently cut her left wrist. Allegedly her husband watched as she took some pills and cut herself approximately one hour prior to arrival. It is unknown what she took. Patient stated she took he husbands "pain pills". Review of records show that patient recently hadan intentional overdose on pain pills and Ambien.   I attempted to interview the patient but she fell asleep at least 3 times during the assessment. Most of the information obtained to complaint this admissionfrom records.  Patient has been denying suicidality, homicidality or having auditory or visual hallucinations. She did stated that her intention was to kill herself when she took the overdose.  She has also had several admissions to behavioral medicine due to depression and multiple attempts to overdose on medications. Patient was just discharged from our unit on 8/2  Patient was receiving outpatient treatment with [facility] but was discharged from their services. She's currently with [facility]. The psychiatrist she was seeing resigned and now she only sees a therapist.  Substance abuse history: Patient has been abusing amphetamines, benzodiazepines and opiates. Neither the opiates are amphetamines are prescribed to her. Patient has a history of alcoholism but per records she has been sober for about a year.  There is history of multiple inpatient treatment for substance abuse.  Per records from prior admission "Apparently the patient has been regularly using her husband's prescribed pain medication. She was also positive for stimulants on admission. [facility]". "She admits that she shares narcotic painkillers that are prescribed for her husband and that she took stimulant that have not been prescribed for her. She reports that she no longer takes benzodiazepines she has not been drinking alcohol for years."  Associated Signs/Symptoms: Depression Symptoms:  depressed mood, suicidal attempt, (Hypo) Manic Symptoms:  Impulsivity, Anxiety Symptoms:  Excessive Worry, Psychotic Symptoms:  denies PTSD Symptoms: NA Total Time spent with patient: 1 hour  Past Psychiatric History: She is has been struggling with depression since her teenage years. She had several psychiatric hospital overdose and cutting when younger. One of the overdose was severe enough to require respirator. She has narrowing of her trachea from it. She has been tried on multiple antidepressants. She believes that the combination of Lamictal and high-dose Prozac works well for her. She has never been on antipsychotic. There is a history of alcoholism but she has been able to maintain sobriety for several years now.  Is the patient at risk to self? Yes.    Has the patient been a risk to self in the  past 6 months? No.  Has the  patient been a risk to self within the distant past? No.  Is the patient a risk to others? No.  Has the patient been a risk to others in the past 6 months? No.  Has the patient been a risk to others within the distant past? No.   Alcohol Screening: 1. How often do you have a drink containing alcohol?: Never 2. How many drinks containing alcohol do you have on a typical day when you are drinking?: 1 or 2 3. How often do you have six or more drinks on one occasion?: Never Preliminary Score: 0 4. How often during the last year have you found that you were not able to stop drinking once you had started?: Never 5. How often during the last year have you failed to do what was normally expected from you becasue of drinking?: Never 6. How often during the last year have you needed a first drink in the morning to get yourself going after a heavy drinking session?: Never 7. How often during the last year have you had a feeling of guilt of remorse after drinking?: Never 8. How often during the last year have you been unable to remember what happened the night before because you had been drinking?: Never 9. Have you or someone else been injured as a result of your drinking?: No 10. Has a relative or friend or a doctor or another health worker been concerned about your drinking or suggested you cut down?: Yes, but not in the last year Alcohol Use Disorder Identification Test Final Score (AUDIT): 2 Brief Intervention: AUDIT score less than 7 or less-screening does not suggest unhealthy drinking-brief intervention not indicated   Past Medical History:  Past Medical History:  Diagnosis Date  . Alcohol abuse unk  . Anxiety unk  . Aspiration into airway   . Depression unk    Past Surgical History:  Procedure Laterality Date  . ABDOMINAL HYSTERECTOMY    . FOOT SURGERY    . TRACHEAL SURGERY     Family History:  Family History  Problem Relation Age of Onset  . Breast cancer Mother   . Hypertension  Mother   . Depression Mother   . Anxiety disorder Mother   . Alcohol abuse Father   . Alcohol abuse Sister   . Anxiety disorder Sister   . Bipolar disorder Sister    Family Psychiatric  History: She denies any.  Tobacco Screening: Have you used any form of tobacco in the last 30 days? (Cigarettes, Smokeless Tobacco, Cigars, and/or Pipes): No   Social History: She lives with her husband, her son and his girlfriend. 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  History  Alcohol Use No    Comment: former alcoholic "sober for a month now"     History  Drug Use No    Additional Social History:      Pain Medications: see PTA meds Prescriptions: see PTA meds Over the Counter: see PTA meds History of alcohol / drug use?: Yes                    Allergies:   Allergies  Allergen Reactions  . Penicillins Hives    Has patient had a PCN reaction causing immediate rash, facial/tongue/throat swelling, SOB or lightheadedness with hypotension: Yes Has patient had a PCN reaction causing severe rash involving mucus membranes or skin necrosis: No Has patient had a PCN reaction that required hospitalization  Yes Has patient had a PCN reaction occurring within the last 10 years: Yes If all of the above answers are "NO", then may proceed with Cephalosporin use.    Lab Results:  Results for orders placed or performed during the hospital encounter of 10/24/15 (from the past 48 hour(s))  Lipid panel, fasting     Status: None   Collection Time: 10/23/15  4:29 AM  Result Value Ref Range   Cholesterol 198 0 - 200 mg/dL   Triglycerides 72 <563<150 mg/dL   HDL 85 >87>40 mg/dL   Total CHOL/HDL Ratio 2.3 RATIO   VLDL 14 0 - 40 mg/dL   LDL Cholesterol 99 0 - 99 mg/dL    Comment:        Total Cholesterol/HDL:CHD Risk Coronary Heart Disease Risk Table                     Men   Women  1/2 Average Risk   3.4   3.3  Average Risk       5.0   4.4  2 X Average Risk   9.6   7.1  3 X Average Risk   23.4   11.0        Use the calculated Patient Ratio above and the CHD Risk Table to determine the patient's CHD Risk.        ATP III CLASSIFICATION (LDL):  <100     mg/dL   Optimal  [phone]  mg/dL   Near or Above                    Optimal  130-159  mg/dL   Borderline  [phone]  mg/dL   High  >166>190     mg/dL   Very High   TSH     Status: None   Collection Time: 10/23/15  4:29 AM  Result Value Ref Range   TSH 3.093 0.350 - 4.500 uIU/mL    Blood Alcohol level:  Lab Results  Component Value Date   ETH <5 10/23/2015   ETH <5 09/27/2015    Metabolic Disorder Labs:  Lab Results  Component Value Date   HGBA1C 5.1 09/30/2015   MPG 103 08/17/2013   Lab Results  Component Value Date   PROLACTIN 78.6 (H) 09/30/2015   Lab Results  Component Value Date   CHOL 198 10/23/2015   TRIG 72 10/23/2015   HDL 85 10/23/2015   CHOLHDL 2.3 10/23/2015   VLDL 14 10/23/2015   LDLCALC 99 10/23/2015   LDLCALC 73 09/30/2015    Current Medications: Current Facility-Administered Medications  Medication Dose Route Frequency Provider Last Rate Last Dose  . acetaminophen (TYLENOL) tablet 650 mg  650 mg Oral Q6H PRN Audery AmelJohn T Clapacs, MD      . alum & mag hydroxide-simeth (MAALOX/MYLANTA) 200-200-20 MG/5ML suspension 30 mL  30 mL Oral Q4H PRN Audery AmelJohn T Clapacs, MD      . atorvastatin (LIPITOR) tablet 20 mg  20 mg Oral q1800 Audery AmelJohn T Clapacs, MD      . bacitracin ointment   Topical Daily Jimmy FootmanAndrea Hernandez-Gonzalez, MD      . chlordiazePOXIDE (LIBRIUM) capsule 25 mg  25 mg Oral TID Jimmy FootmanAndrea Hernandez-Gonzalez, MD      . clindamycin (CLEOCIN) capsule 300 mg  300 mg Oral Q6H Jimmy FootmanAndrea Hernandez-Gonzalez, MD      . FLUoxetine (PROZAC) capsule 40 mg  40 mg Oral Daily Audery AmelJohn T Clapacs, MD   40 mg at 10/24/15 0908  .  lamoTRIgine (LAMICTAL) tablet 200 mg  200 mg Oral Daily Audery Amel, MD   200 mg at 10/24/15 0907  . magnesium hydroxide (MILK OF MAGNESIA) suspension 30 mL  30 mL Oral Daily PRN Audery Amel, MD       . traZODone (DESYREL) tablet 150 mg  150 mg Oral QHS PRN Jimmy Footman, MD       PTA Medications: Prescriptions Prior to Admission  Medication Sig Dispense Refill Last Dose  . ALPRAZolam (XANAX) 1 MG tablet Take 1 mg by mouth 3 (three) times daily.   unknown at unknown  . atorvastatin (LIPITOR) 20 MG tablet Take 20 mg by mouth at bedtime.   unknown at unknown  . FLUoxetine (PROZAC) 40 MG capsule Take 2 capsules (80 mg total) by mouth daily. 180 capsule 1 unknown at unknown  . folic acid (FOLVITE) 1 MG tablet Take 1 tablet (1 mg total) by mouth daily. (Patient taking differently: Take 1 mg by mouth at bedtime. ) 30 tablet 0 unknown at unknown  . lamoTRIgine (LAMICTAL) 200 MG tablet Take 1 tablet (200 mg total) by mouth daily. 90 tablet 1 unknown at unknown  . naloxone HCl 4 MG/0.1ML LIQD administer in case of opioid overdose. 2 each 2 prn at prn  . QUEtiapine (SEROQUEL) 200 MG tablet Take 1 tablet (200 mg total) by mouth at bedtime. 30 tablet 1 unknown at unknown    Musculoskeletal: Strength & Muscle Tone: within normal limits Gait & Station: normal Patient leans: N/A  Psychiatric Specialty Exam: Physical Exam  Constitutional: She is oriented to person, place, and time. She appears well-developed and well-nourished.  HENT:  Head: Normocephalic and atraumatic.  Eyes: EOM are normal.  Neck: Normal range of motion.  Respiratory: Effort normal.  Musculoskeletal: Normal range of motion.  Neurological: She is oriented to person, place, and time.  Sedated, falling asleep during assessment    Review of Systems  Constitutional: Negative.   HENT: Negative.   Eyes: Negative.   Respiratory: Negative.   Cardiovascular: Negative.   Gastrointestinal: Negative.   Genitourinary: Negative.   Musculoskeletal: Negative.   Skin: Positive for rash.  Neurological: Negative.   Endo/Heme/Allergies: Negative.   Psychiatric/Behavioral: Positive for depression. Negative for suicidal  ideas.    Blood pressure 106/68, pulse 84, temperature 99.1 F (37.3 C), temperature source Oral, resp. rate 18, height 5\' 5"  (1.651 m), weight 67.1 kg (148 lb), SpO2 99 %.Body mass index is 24.63 kg/m.  General Appearance: Fairly Groomed  Eye Contact:  Minimal  Speech:  Garbled  Volume:  Decreased  Mood:  Dysphoric  Affect:  Blunt  Thought Process:  Irrelevant and Descriptions of Associations: Loose  Orientation:  Full (Time, Place, and Person)  Thought Content:  Hallucinations: None  Suicidal Thoughts:  No  Homicidal Thoughts:  No  Memory:  Immediate;   Poor Recent;   Poor Remote;   Poor  Judgement:  Impaired  Insight:  Shallow  Psychomotor Activity:  Decreased  Concentration:  Concentration: Poor and Attention Span: Poor  Recall:  Poor  Fund of Knowledge:  Good  Language:  Fair  Akathisia:  No  Handed:    AIMS (if indicated):     Assets:  Manufacturing systems engineer Social Support  ADL's:  Intact  Cognition:  WNL  Sleep:  Number of Hours: 4.25   Treatment Plan Summary:  Major depressive disorder patient will be continued on fluoxetine and Lamictal as prescribed by her outpatient psychiatrist.  Patient says she has been diagnosed  with bipolar disorder but I strongly suspect borderline personality disorder.  Seroquel will be discontinued as this medication is highly abusable.  Also the patient is overly sedated at this point in time  Benzodiazepine abuse: Patient has been prescribed with alprazolam 1 mg 3 times a day. I will discontinue this medication and instead I will start her on Librium 25 mg 3 times a day will recommend to taper off benzodiazepines  Opiate use disorder benzodiazepine use disorder, amphetamine use disorder, alcohol use disorder in remission: This patient is in need of outpatient or inpatient substance abuse treatment   Dyslipidemia continue Lipitor 20 mg a day  Self-inflicted injury: Patient cut her left wrist and now appears to have a cellulitis. I will  order daily dressings and clindamycin as she is allergic to penicillins, this was discussed with pharmacist  Drowsiness and sedation: Likely due to recent overdose. We will try to minimize the use of sedatives. Seroquel will be discontinued, proximal and has been discontinued. I will order fall precautions.  Vital signs daily  Precautions every 15 minute checks  Diet regular   I certify that inpatient services furnished can reasonably be expected to improve the patient's condition.    Jimmy Footman, MD 8/26/201711:02 AM

## 2015-10-24 NOTE — [facility] Group Notes (Signed)
[facility] Group Notes:  (Nursing/MHT/Case Management/Adjunct)  Date:  10/24/2015  Time:  9:49 PM  Type of Therapy:  Group Therapy  Participation Level:  Did Not Attend  Participation Quality: Summary of Progress/Problems:  Mayra NeerJackie L Jousha Schwandt 10/24/2015, 9:49 PM

## 2015-10-25 LAB — PROLACTIN: PROLACTIN: 127.2 ng/mL — AB (ref 4.8–23.3)

## 2015-10-25 NOTE — Progress Notes (Signed)
[facility] MD Progress Note  10/25/2015 10:38 AM Patricia [name]  MRN:  [id number] Subjective:  During the assessment yesterday the patient kept falling asleep. This morning she refused to get out of bed and even look at me for assessment. She was lying in bed covered with blankets says she did not want to talk. She denied to me having suicidality, physical complaints or side effects.  Yesterday she was upset with nurses because they told her to put him pants and not to leave the room  wearing only her shorts  Per nursing: D: Patient is alert and oriented on the unit this shift. Patient attended   groups today. Patient denies suicidal ideation, homicidal ideation, auditory or visual hallucinations at the present time.  A: Scheduled medications are administered to patient as per MD orders. Emotional support and encouragement are provided. Patient is maintained on q.15 minute safety checks. Patient is informed to notify staff with questions or concerns. R: No adverse medication reactions are noted. Patient is cooperative with medication administration and treatment plan today. Patient is slightly receptive, calm ,sad,depressed but    cooperative on the unit at this time. Patient does not interact  with others on the unit this shift. Patient contracts for safety at this time. Patient remains safe at this time.  Principal Problem: Major depressive disorder, recurrent severe without psychotic features (HCC) Diagnosis:   Patient Active Problem List   Diagnosis Date Noted  . Alcohol use disorder, moderate, in sustained remission (HCC) [F10.21] 10/24/2015  . Opiate overdose [T40.601A] 09/28/2015  . 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 Major depressive disorder, recurrent severe without psychotic features (HCC) [F33.2] 09/28/2015  . Opioid use disorder, moderate, dependence (HCC) [F11.20] 09/28/2015  . 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 H/O eating disorder  [Z86.59] 09/19/2013   Total Time spent with patient: 30 minutes  Past Psychiatric History:   Past Medical History:  Past Medical History:  Diagnosis Date  . Alcohol abuse unk  . Anxiety unk  . Aspiration into airway   . Depression unk    Past Surgical History:  Procedure Laterality Date  . ABDOMINAL HYSTERECTOMY    . FOOT SURGERY    . TRACHEAL SURGERY     Family History:  Family History  Problem Relation Age of Onset  . Breast cancer Mother   . Hypertension Mother   . Depression Mother   . Anxiety disorder Mother   . Alcohol abuse Father   . Alcohol abuse Sister   . Anxiety disorder Sister   . Bipolar disorder Sister    Family Psychiatric  History:  Social History:  History  Alcohol Use No    Comment: former alcoholic "sober for a month now"     History  Drug Use No    Social History   Social History  . Marital status: Married    Spouse name: N/A  . Number of children: N/A  . Years of education: N/A   Social History Main Topics  . Smoking status: Former Smoker    Types: Cigarettes  . Smokeless tobacco: Never Used  . Alcohol use No     Comment: former alcoholic "sober for a month now"  . Drug use: No  . Sexual activity: Not Currently   Other Topics Concern  . None   Social History Narrative  . None   Additional Social History:    Pain Medications: see PTA meds Prescriptions: see PTA meds Over the Counter: see PTA  meds History of alcohol / drug use?: Yes      Current Medications: Current Facility-Administered Medications  Medication Dose Route Frequency Provider Last Rate Last Dose  . acetaminophen (TYLENOL) tablet 650 mg  650 mg Oral Q6H PRN Audery AmelJohn T Clapacs, MD      . alum & mag hydroxide-simeth (MAALOX/MYLANTA) 200-200-20 MG/5ML suspension 30 mL  30 mL Oral Q4H PRN Audery AmelJohn T Clapacs, MD      . atorvastatin (LIPITOR) tablet 20 mg  20 mg Oral q1800 Audery AmelJohn T Clapacs, MD   20 mg at 10/24/15 1705  . bacitracin ointment   Topical Daily Jimmy FootmanAndrea  Hernandez-Gonzalez, MD   1 application at 10/25/15 [id number]  . chlordiazePOXIDE (LIBRIUM) capsule 25 mg  25 mg Oral TID Jimmy FootmanAndrea Hernandez-Gonzalez, MD   25 mg at 10/25/15 [id number]  . clindamycin (CLEOCIN) capsule 300 mg  300 mg Oral Q6H Jimmy FootmanAndrea Hernandez-Gonzalez, MD   300 mg at 10/25/15 [id number]  . FLUoxetine (PROZAC) capsule 40 mg  40 mg Oral Daily Audery AmelJohn T Clapacs, MD   40 mg at 10/25/15 [id number]  . lamoTRIgine (LAMICTAL) tablet 200 mg  200 mg Oral Daily Audery AmelJohn T Clapacs, MD   200 mg at 10/25/15 [id number]  . magnesium hydroxide (MILK OF MAGNESIA) suspension 30 mL  30 mL Oral Daily PRN Audery AmelJohn T Clapacs, MD      . traZODone (DESYREL) tablet 150 mg  150 mg Oral QHS PRN Jimmy FootmanAndrea Hernandez-Gonzalez, MD   150 mg at 10/24/15 2131    Lab Results: No results found for this or any previous visit (from the past 48 hour(s)).  Blood Alcohol level:  Lab Results  Component Value Date   ETH <5 10/23/2015   ETH <5 09/27/2015    Metabolic Disorder Labs: Lab Results  Component Value Date   HGBA1C 4.9 10/23/2015   MPG 103 08/17/2013   Lab Results  Component Value Date   PROLACTIN 127.2 (H) 10/23/2015   PROLACTIN 78.6 (H) 09/30/2015   Lab Results  Component Value Date   CHOL 198 10/23/2015   TRIG 72 10/23/2015   HDL 85 10/23/2015   CHOLHDL 2.3 10/23/2015   VLDL 14 10/23/2015   LDLCALC 99 10/23/2015   LDLCALC 73 09/30/2015    Physical Findings: AIMS: Facial and Oral Movements Muscles of Facial Expression: None, normal Lips and Perioral Area: None, normal Jaw: None, normal Tongue: None, normal,Extremity Movements Upper (arms, wrists, hands, fingers): None, normal Lower (legs, knees, ankles, toes): None, normal, Trunk Movements Neck, shoulders, hips: None, normal, Overall Severity Severity of abnormal movements (highest score from questions above): None, normal Incapacitation due to abnormal movements: None, normal Patient's awareness of abnormal movements (rate only patient's report): No Awareness, Dental  Status Current problems with teeth and/or dentures?: No Does patient usually wear dentures?: No  CIWA:    COWS:     Musculoskeletal: Strength & Muscle Tone: within normal limits Gait & Station: normal Patient leans: N/A  Psychiatric Specialty Exam: Physical Exam  Constitutional: She is oriented to person, place, and time. She appears well-developed and well-nourished.  HENT:  Head: Normocephalic and atraumatic.  Eyes: EOM are normal.  Neck: Normal range of motion.  Respiratory: Effort normal.  Musculoskeletal: Normal range of motion.  Neurological: She is alert and oriented to person, place, and time.    Review of Systems  Constitutional: Negative.   HENT: Negative.   Eyes: Negative.   Respiratory: Negative.   Cardiovascular: Negative.   Gastrointestinal: Negative.   Genitourinary: Negative.   Musculoskeletal: Negative.  Skin: Negative.   Neurological: Negative.   Endo/Heme/Allergies: Negative.   Psychiatric/Behavioral: Positive for depression and substance abuse.    Blood pressure 110/76, pulse 85, temperature 98.6 F (37 C), temperature source Oral, resp. rate 18, height 5\' 5"  (1.651 m), weight 67.1 kg (148 lb), SpO2 99 %.Body mass index is 24.63 kg/m.  General Appearance: Disheveled  Eye Contact:  Poor  Speech:  Slow  Volume:  Decreased  Mood:  Dysphoric  Affect:  Blunt  Thought Process:  Linear and Descriptions of Associations: Intact  Orientation:  Full (Time, Place, and Person)  Thought Content:  NA  Suicidal Thoughts:  No  Homicidal Thoughts:  No  Memory:  Immediate;   NA Recent;   NA Remote;   NA  Judgement:  Poor  Insight:  Shallow  Psychomotor Activity:  Decreased  Concentration:  Concentration: NA and Attention Span: NA  Recall:  NA  Fund of Knowledge:  NA  Language:  Good  Akathisia:  No  Handed:    AIMS (if indicated):     Assets:  Physical Health  ADL's:  Intact  Cognition:  WNL  Sleep:  Number of Hours: 7.15     Treatment Plan  Summary: Daily contact with patient to assess and evaluate symptoms and progress in treatment and Medication management  Major depressive disorder patient will be continued on fluoxetine and Lamictal as prescribed by her outpatient psychiatrist.  Patient says she has been diagnosed with bipolar disorder but I strongly suspect borderline personality disorder.  Seroquel will be discontinued as this medication is highly abusable.  Also the patient is overly sedated at this point in time. Also prolactin is elevated  Benzodiazepine abuse: Patient has been prescribed with alprazolam 1 mg 3 times a day. I will discontinue this medication and instead I will start her on Librium 25 mg 3 times a day will recommend to taper off benzodiazepines  Opiate use disorder benzodiazepine use disorder, amphetamine use disorder, alcohol use disorder in remission: This patient is in need of outpatient or inpatient substance abuse treatment   Dyslipidemia continue Lipitor 20 mg a day  Self-inflicted injury: Patient cut her left wrist and now appears to have a cellulitis. I will order daily dressings and clindamycin as she is allergic to penicillins, this was discussed with pharmacist  Drowsiness and sedation: Likely due to recent overdose. We will try to minimize the use of sedatives. Seroquel will be discontinued, xanaxand has been discontinued. I will order fall precautions.  Vital signs daily  Precautions every 15 minute checks  Diet regular  Patient was uncooperative with assessment today. For now I will not make any changes on her medication regimen.  Vital signs are stable no evidence of withdrawal from benzodiazepines.   Jimmy Footman, MD 10/25/2015, 10:38 AM

## 2015-10-25 NOTE — Plan of Care (Signed)
Problem: Education: Goal: Knowledge of the prescribed therapeutic regimen will improve Outcome: Progressing rnInstructed medications and purpose and side effects CTownsend RN

## 2015-10-25 NOTE — Progress Notes (Signed)
Patient remained isolated and in bed most of this shift. She did not get out of room for breakfast or medications. She was noted to be depressed, with a flat affect , irritable in mood and uncooperative. She came out for lunch and returned back to bed. She stated that she was depressed, denied SI at this time and contracted for safety. Patient's Left Wrist self-inflicted wound was cleaned with soap and water, dried, covered with a non-adhesive gauze and tape applied. Patient was encouraged to go to groups of get out of her room but she refused. Will continue to monitor on Q6415min observation to ensure safety.

## 2015-10-25 NOTE — Plan of Care (Signed)
Problem: Health Behavior/Discharge Planning: Goal: Ability to manage health-related needs will improve Outcome: Progressing Patient medication compliant but refused to come to the med room.

## 2015-10-25 NOTE — Progress Notes (Signed)
D: Patient is   Oriented to self  on the unit this shift. Patient attended and   participated in groups today. Patient denies suicidal ideation, homicidal ideation, auditory or visual hallucinations at the present time.  A: Scheduled medications are administered to patient as per MD orders. Emotional support and encouragement are provided. Patient is maintained on q.15 minute safety checks. Patient is informed to notify staff with questions or concerns. R: No adverse medication reactions are noted. Patient is cooperative with medication administration and treatment plan today. Patient is receptive but confused at times, calm and cooperative on the unit at this time. Patient does not interact  with others on the unit this shift. Patient contracts for safety at this time. Patient remains safe at this time.

## 2015-10-25 NOTE — [facility] Group Notes (Signed)
[facility] Group Notes:  (Nursing/MHT/Case Management/Adjunct)  Date:  10/25/2015  Time:  11:02 PM  Type of Therapy:  Evening Wrap-up Group  Participation Level:  Did Not Attend  Participation Quality:  N/A  Affect:  N/A  Cognitive:  N/A  Insight:  None  Engagement in Group:  Did Not Attend  Modes of Intervention:  Activity  Summary of Progress/Problems:  Tomasita MorrowChelsea Nanta Jeslyn Amsler 10/25/2015, 11:02 PM

## 2015-10-25 NOTE — Progress Notes (Signed)
D: Patient is alert and oriented on the unit this shift. Patient attended   groups today. Patient denies suicidal ideation, homicidal ideation, auditory or visual hallucinations at the present time.  A: Scheduled medications are administered to patient as per MD orders. Emotional support and encouragement are provided. Patient is maintained on q.15 minute safety checks. Patient is informed to notify staff with questions or concerns. R: No adverse medication reactions are noted. Patient is cooperative with medication administration and treatment plan today. Patient is slightly receptive, calm ,sad,depressed but    cooperative on the unit at this time. Patient does not interact  with others on the unit this shift. Patient contracts for safety at this time. Patient remains safe at this time.

## 2015-10-25 NOTE — Plan of Care (Signed)
Problem: Coping: Goal: Ability to cope will improve Outcome: Not Progressing Patient not using coping skills as of yet Tax adviserCTownsend RN

## 2015-10-25 NOTE — [facility] Group Notes (Signed)
[facility] LCSW Group Therapy  10/25/2015 2:31 PM  Type of Therapy:  Group Therapy  Participation Level:  None, Pt came to group but left shortly after stating "It's too hot out here."  Summary of Progress/Problems:Emotional Regulation: Patients will identify both negative and positive emotions. They will discuss emotions they have difficulty regulating and how they impact their lives. Patients will be asked to identify healthy coping skills to combat unhealthy reactions to negative emotions.    Ashely Goosby G. Garnette CzechSampson MSW, LCSWA 10/25/2015, 2:32 PM

## 2015-10-25 NOTE — [facility] Counselor (Signed)
CSW attempted to meet with patient to complete PSA. Patient was drowsy and did not want to get out of bed. Patient became confused and stated "Where am I, who are you?". CSW attempted to introduce herself again, but patient fell back to sleep. Patient is unable to assess at this time.   Nastasia Kage G. Garnette CzechSampson MSW, LCSWA\ 10/25/2015 11:44 AM

## 2015-10-26 ENCOUNTER — Encounter: Payer: Self-pay | Admitting: Psychiatry

## 2015-10-26 MED ORDER — CHLORDIAZEPOXIDE HCL 10 MG PO CAPS
10.0000 mg | ORAL_CAPSULE | Freq: Three times a day (TID) | ORAL | Status: DC
  Administered 2015-10-26 – 2015-10-27 (×3): 10 mg via ORAL
  Filled 2015-10-26 (×3): qty 1

## 2015-10-26 MED ORDER — QUETIAPINE FUMARATE 100 MG PO TABS
100.0000 mg | ORAL_TABLET | Freq: Every day | ORAL | Status: DC
  Administered 2015-10-26 – 2015-10-27 (×2): 100 mg via ORAL
  Filled 2015-10-26 (×2): qty 1

## 2015-10-26 NOTE — Progress Notes (Signed)
Recreation Therapy Notes  Date: 08.28.17 Time: 10:10 am Location: Craft Room  Group Topic: Self-expression  Goal Area(s) Addresses:  Patient will be able to identify a color that represents each emotion. Patient will verbalize benefit of using art as a means of self-expression. Patient will verbalize one emotion experienced while participating in activity.  Behavioral Response: Did not attend  Intervention: The Colors Within Me  Activity: Patients were given blank face worksheets and instructed to pick a color for each emotion they were feeling and show on the face how much of that emotion they were feeling.  Education: LRT educated patients on other forms of self-expression.  Education Outcome: Patient did not attend group.   Clinical Observations/Feedback: Patient did not attend group.  Jacquelynn CreeGreene,Mikle Sternberg M, LRT/CTRS 10/26/2015 10:50 AM

## 2015-10-26 NOTE — [facility] Group Notes (Signed)
[facility] Group Notes:  (Nursing/MHT/Case Management/Adjunct)  Date:  10/26/2015  Time:  9:33 PM  Type of Therapy:  Group Therapy  Participation Level:  Active  Participation Quality:  Attentive  Affect:  Depressed and Flat  Cognitive:  Oriented  Insight:  Lacking  Engagement in Group:  Engaged  Modes of Intervention:  Discussion  Summary of Progress/Problems: Pt stated that she is not ready to face her family yet. She hopes that her discharge will not be soon because she is still trying to figure out how to deal with her life. Pt stated that being here is like an escape. Staff made pt aware that she should remain open to her treatment plan and continue to be vocal with her doctor and social worker about how she feels.   Veva Holesshley Imani Cashis Rill 10/26/2015, 9:33 PM

## 2015-10-26 NOTE — Plan of Care (Signed)
Problem: Medication: Goal: Compliance with prescribed medication regimen will improve Outcome: Progressing Med compliant after encouragement

## 2015-10-26 NOTE — Progress Notes (Addendum)
[facility] MD Progress Note  10/26/2015 10:42 AM Patricia [name]  MRN:  [id number]  Subjective: Patricia [name] reports feeling better today but appears very confused unable to answer simple questions. It is unclear who prescribes her medications as she did not see her psychiatrist since discharge. It is unclear whether or not she is taking Xanax or clonazepam. The patient is unsure. She is glad to be alive. She reportedly spoke with her husband who now agrees to get help for his opiate addiction. She could not sleep last night with Seroquel.  Principal Problem: Major depressive disorder, recurrent severe without psychotic features (HCC) Diagnosis:   Patient Active Problem List   Diagnosis Date Noted  . Major depressive disorder, recurrent severe without psychotic features (HCC) [F33.2] 09/28/2015    Priority: High  . Alcohol use disorder, moderate, in sustained remission (HCC) [F10.21] 10/24/2015  . Opiate overdose [T40.601A] 09/28/2015  . 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 Opioid use disorder, moderate, dependence (HCC) [F11.20] 09/28/2015  . 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 H/O eating disorder [Z86.59] 09/19/2013   Total Time spent with patient: 30 minutes  Past Psychiatric History: Depression, substance abuse.  Past Medical History:  Past Medical History:  Diagnosis Date  . Alcohol abuse unk  . Anxiety unk  . Aspiration into airway   . Depression unk    Past Surgical History:  Procedure Laterality Date  . ABDOMINAL HYSTERECTOMY    . FOOT SURGERY    . TRACHEAL SURGERY     Family History:  Family History  Problem Relation Age of Onset  . Breast cancer Mother   . Hypertension Mother   . Depression Mother   . Anxiety disorder Mother   . Alcohol abuse Father   . Alcohol abuse Sister   . Anxiety disorder Sister   . Bipolar disorder Sister    Family Psychiatric  History: See H&P. Social History:  History  Alcohol Use No   Comment: former alcoholic "sober for a month now"     History  Drug Use No    Social History   Social History  . Marital status: Married    Spouse name: N/A  . Number of children: N/A  . Years of education: N/A   Social History Main Topics  . Smoking status: Former Smoker    Types: Cigarettes  . Smokeless tobacco: Never Used  . Alcohol use No     Comment: former alcoholic "sober for a month now"  . Drug use: No  . Sexual activity: Not Currently   Other Topics Concern  . None   Social History Narrative  . None   Additional Social History:    Pain Medications: see PTA meds Prescriptions: see PTA meds Over the Counter: see PTA meds History of alcohol / drug use?: Yes      Current Medications: Current Facility-Administered Medications  Medication Dose Route Frequency Provider Last Rate Last Dose  . acetaminophen (TYLENOL) tablet 650 mg  650 mg Oral Q6H PRN Audery Amel, MD      . alum & mag hydroxide-simeth (MAALOX/MYLANTA) 200-200-20 MG/5ML suspension 30 mL  30 mL Oral Q4H PRN Audery Amel, MD      . atorvastatin (LIPITOR) tablet 20 mg  20 mg Oral q1800 Audery Amel, MD   20 mg at 10/25/15 1644  . bacitracin ointment   Topical Daily Jimmy Footman, MD   1 application at 10/26/15 0802  . chlordiazePOXIDE (LIBRIUM) capsule 25 mg  25 mg Oral TID Jimmy FootmanAndrea Hernandez-Gonzalez, MD   25 mg at 10/26/15 0802  . clindamycin (CLEOCIN) capsule 300 mg  300 mg Oral Q6H Jimmy FootmanAndrea Hernandez-Gonzalez, MD   300 mg at 10/26/15 0600  . FLUoxetine (PROZAC) capsule 40 mg  40 mg Oral Daily Audery AmelJohn T Clapacs, MD   40 mg at 10/26/15 0802  . lamoTRIgine (LAMICTAL) tablet 200 mg  200 mg Oral Daily Audery AmelJohn T Clapacs, MD   200 mg at 10/26/15 0802  . magnesium hydroxide (MILK OF MAGNESIA) suspension 30 mL  30 mL Oral Daily PRN Audery AmelJohn T Clapacs, MD      . traZODone (DESYREL) tablet 150 mg  150 mg Oral QHS PRN Jimmy FootmanAndrea Hernandez-Gonzalez, MD   150 mg at 10/25/15 2145    Lab Results: No results  found for this or any previous visit (from the past 48 hour(s)).  Blood Alcohol level:  Lab Results  Component Value Date   ETH <5 10/23/2015   ETH <5 09/27/2015    Metabolic Disorder Labs: Lab Results  Component Value Date   HGBA1C 4.9 10/23/2015   MPG 103 08/17/2013   Lab Results  Component Value Date   PROLACTIN 127.2 (H) 10/23/2015   PROLACTIN 78.6 (H) 09/30/2015   Lab Results  Component Value Date   CHOL 198 10/23/2015   TRIG 72 10/23/2015   HDL 85 10/23/2015   CHOLHDL 2.3 10/23/2015   VLDL 14 10/23/2015   LDLCALC 99 10/23/2015   LDLCALC 73 09/30/2015    Physical Findings: AIMS: Facial and Oral Movements Muscles of Facial Expression: None, normal Lips and Perioral Area: None, normal Jaw: None, normal Tongue: None, normal,Extremity Movements Upper (arms, wrists, hands, fingers): None, normal Lower (legs, knees, ankles, toes): None, normal, Trunk Movements Neck, shoulders, hips: None, normal, Overall Severity Severity of abnormal movements (highest score from questions above): None, normal Incapacitation due to abnormal movements: None, normal Patient's awareness of abnormal movements (rate only patient's report): No Awareness, Dental Status Current problems with teeth and/or dentures?: No Does patient usually wear dentures?: No  CIWA:    COWS:     Musculoskeletal: Strength & Muscle Tone: within normal limits Gait & Station: normal Patient leans: N/A  Psychiatric Specialty Exam: Physical Exam  Nursing note and vitals reviewed. Constitutional: She is oriented to person, place, and time.  Musculoskeletal: Normal range of motion.  Neurological: She is oriented to person, place, and time.    Review of Systems  Psychiatric/Behavioral: Positive for depression and substance abuse.  All other systems reviewed and are negative.   Blood pressure 113/65, pulse 77, temperature 98.9 F (37.2 C), temperature source Oral, resp. rate 18, height 5\' 5"  (1.651 m),  weight 67.1 kg (148 lb), SpO2 99 %.Body mass index is 24.63 kg/m.  General Appearance: Disheveled  Eye Contact:  Poor  Speech:  Slow  Volume:  Decreased  Mood:  Dysphoric  Affect:  Blunt  Thought Process:  Disorganized  Orientation:  Full (Time, Place, and Person)  Thought Content:  NA  Suicidal Thoughts:  No  Homicidal Thoughts:  No  Memory:  Immediate;   NA Recent;   NA Remote;   NA  Judgement:  Poor  Insight:  Shallow  Psychomotor Activity:  Decreased  Concentration:  Concentration: NA and Attention Span: NA  Recall:  NA  Fund of Knowledge:  NA  Language:  Good  Akathisia:  No  Handed:    AIMS (if indicated):     Assets:  Physical Health  ADL's:  Intact  Cognition:  WNL  Sleep:  Number of Hours: 7.3     Treatment Plan Summary: Daily contact with patient to assess and evaluate symptoms and progress in treatment and Medication management  Ms. Chauncey is a 55 year old female with history of depression and mood lability admitted after suicide attempt by opiate overdose.   1. Suicidal ideation. The patient is able to contract for safety in the hospital.   2. Major depressive disorder. We continued fluoxetine, Lamictal and Seroquel for depression and mood stabilization.     3. Benzodiazepine use. The patient has been prescribed alprazolam 1 mg 3 times a day by her PCP. We started librium taper started.   4. Opiate use disorder benzodiazepine use disorder, amphetamine use disorder, alcohol use disorder in remission. This patient is in need of outpatient or inpatient substance abuse treatment   5. Dyslipidemia. We continued Lipitor 20 mg a day  6. Self-inflicted injury: Patient cut her left wrist and now appears to have a cellulitis. I will order daily dressings and clindamycin as she is allergic to penicillins, this was discussed with the pharmacist.  7. Drowsiness and sedation: Likely due to recent overdose. We will try to minimize the use of sedatives. Seroquel  will be discontinued, xanaxand has been discontinued. I will order fall precautions.  8. Metabolic syndrome monitoring. Lipid profile, hemoglobin A1c, and TSH are normal. Prolactin 127.    9. EKG. Normal sinus rhythm. QT 390.  10. Disposition. She will be discharged with her husband. She will follow up with [facility].  Kristine Linea, MD 10/26/2015, 10:42 AM

## 2015-10-26 NOTE — Progress Notes (Signed)
D: Pt presents irritable this shift with some mild confusion. Pt irritated due to dressing change on arm. Dressing was changed this am. Pt requested dressing to arm be changed. Reports it had not been done. Dressing changed again. Pt also encouraged to take morning meds, patient states "I already took my meds" reassured pt she had not taken these meds, pt finally took morning medication. Denies SI currently, states her depression is "OK" and would not elaborate on it. Denies HI and AVH.  A: Encouragement and support offered, encouraged patient to take meds prescribed by doctor. Safety  Checks maintained.  R: Pt receptive but irritable. Mild confusion noted. Remains safe on unit with q 15 min checks.

## 2015-10-27 MED ORDER — CHLORDIAZEPOXIDE HCL 5 MG PO CAPS
5.0000 mg | ORAL_CAPSULE | Freq: Three times a day (TID) | ORAL | Status: DC
  Administered 2015-10-27 – 2015-10-28 (×3): 5 mg via ORAL
  Filled 2015-10-27 (×3): qty 1

## 2015-10-27 NOTE — [facility] Group Notes (Signed)
[facility] Group Notes:  (Nursing/MHT/Case Management/Adjunct)  Date:  10/27/2015  Time:  1:49 PM  Type of Therapy:  Psychoeducational Skills  Participation Level:  Active  Participation Quality:  Attentive  Affect:  Flat  Cognitive:  Appropriate  Insight:  Limited  Engagement in Group:  Limited  Modes of Intervention:  Discussion and Education  Summary of Progress/Problems:  Twanna Hymanda C Elpidio Thielen 10/27/2015, 1:49 PM

## 2015-10-27 NOTE — Tx Team (Signed)
Interdisciplinary Treatment and Diagnostic Plan Update  10/27/2015 Time of Session: 9:40 AM  Nikko Emalyn Schou MRN: [id number]  Principal Diagnosis: Major depressive disorder, recurrent severe without psychotic features (HCC)  Secondary Diagnoses: Principal Problem:   Major depressive disorder, recurrent severe without psychotic features (HCC) Active Problems:   Opiate overdose   Alcohol use disorder, moderate, in sustained remission (HCC)   Current Medications:  Current Facility-Administered Medications  Medication Dose Route Frequency Provider Last Rate Last Dose  . acetaminophen (TYLENOL) tablet 650 mg  650 mg Oral Q6H PRN Audery Amel, MD      . alum & mag hydroxide-simeth (MAALOX/MYLANTA) 200-200-20 MG/5ML suspension 30 mL  30 mL Oral Q4H PRN Audery Amel, MD      . atorvastatin (LIPITOR) tablet 20 mg  20 mg Oral q1800 Audery Amel, MD   20 mg at 10/26/15 1652  . bacitracin ointment   Topical Daily Jimmy Footman, MD   1 application at 10/26/15 0802  . chlordiazePOXIDE (LIBRIUM) capsule 10 mg  10 mg Oral TID Shari Prows, MD   10 mg at 10/27/15 0856  . clindamycin (CLEOCIN) capsule 300 mg  300 mg Oral Q6H Jimmy Footman, MD   300 mg at 10/27/15 0636  . FLUoxetine (PROZAC) capsule 40 mg  40 mg Oral Daily Audery Amel, MD   40 mg at 10/27/15 0856  . lamoTRIgine (LAMICTAL) tablet 200 mg  200 mg Oral Daily Audery Amel, MD   200 mg at 10/27/15 0856  . magnesium hydroxide (MILK OF MAGNESIA) suspension 30 mL  30 mL Oral Daily PRN Audery Amel, MD      . QUEtiapine (SEROQUEL) tablet 100 mg  100 mg Oral QHS Shari Prows, MD   100 mg at 10/26/15 2145  . traZODone (DESYREL) tablet 150 mg  150 mg Oral QHS PRN Jimmy Footman, MD   150 mg at 10/25/15 2145    PTA Medications: Prescriptions Prior to Admission  Medication Sig Dispense Refill Last Dose  . ALPRAZolam (XANAX) 1 MG tablet Take 1 mg by mouth 3 (three) times daily.   unknown  at unknown  . atorvastatin (LIPITOR) 20 MG tablet Take 20 mg by mouth at bedtime.   unknown at unknown  . FLUoxetine (PROZAC) 40 MG capsule Take 2 capsules (80 mg total) by mouth daily. 180 capsule 1 unknown at unknown  . folic acid (FOLVITE) 1 MG tablet Take 1 tablet (1 mg total) by mouth daily. (Patient taking differently: Take 1 mg by mouth at bedtime. ) 30 tablet 0 unknown at unknown  . lamoTRIgine (LAMICTAL) 200 MG tablet Take 1 tablet (200 mg total) by mouth daily. 90 tablet 1 unknown at unknown  . naloxone HCl 4 MG/0.1ML LIQD administer in case of opioid overdose. 2 each 2 prn at prn  . QUEtiapine (SEROQUEL) 200 MG tablet Take 1 tablet (200 mg total) by mouth at bedtime. 30 tablet 1 unknown at unknown    Treatment Modalities: Medication Management, Group therapy, Case management,  1 to 1 session with clinician, Psychoeducation, Recreational therapy.   Physician Treatment Plan for Primary Diagnosis: Major depressive disorder, recurrent severe without psychotic features (HCC) Long Term Goal(s): Improvement in symptoms so as ready for discharge  Short Term Goals: Ability to identify changes in lifestyle to reduce recurrence of condition will improve, Ability to verbalize feelings will improve, Ability to disclose and discuss suicidal ideas, Ability to demonstrate self-control will improve, Ability to identify and develop effective coping behaviors  will improve, Ability to maintain clinical measurements within normal limits will improve and Compliance with prescribed medications will improve  Medication Management: Evaluate patient's response, side effects, and tolerance of medication regimen.  Therapeutic Interventions: 1 to 1 sessions, Unit Group sessions and Medication administration.  Evaluation of Outcomes: Progressing  Physician Treatment Plan for Secondary Diagnosis: Principal Problem:   Major depressive disorder, recurrent severe without psychotic features (HCC) Active  Problems:   Opiate overdose   Alcohol use disorder, moderate, in sustained remission (HCC)   Long Term Goal(s): Improvement in symptoms so as ready for discharge  Short Term Goals: Ability to identify changes in lifestyle to reduce recurrence of condition will improve, Ability to verbalize feelings will improve, Ability to disclose and discuss suicidal ideas, Ability to demonstrate self-control will improve, Ability to identify and develop effective coping behaviors will improve, Ability to maintain clinical measurements within normal limits will improve and Compliance with prescribed medications will improve  Medication Management: Evaluate patient's response, side effects, and tolerance of medication regimen.  Therapeutic Interventions: 1 to 1 sessions, Unit Group sessions and Medication administration.  Evaluation of Outcomes: Progressing   RN Treatment Plan for Primary Diagnosis: Major depressive disorder, recurrent severe without psychotic features (HCC) Long Term Goal(s): Knowledge of disease and therapeutic regimen to maintain health will improve  Short Term Goals: Ability to remain free from injury will improve, Ability to verbalize frustration and anger appropriately will improve, Ability to demonstrate self-control, Ability to participate in decision making will improve, Ability to verbalize feelings will improve, Ability to disclose and discuss suicidal ideas, Ability to identify and develop effective coping behaviors will improve and Compliance with prescribed medications will improve  Medication Management: RN will administer medications as ordered by provider, will assess and evaluate patient's response and provide education to patient for prescribed medication. RN will report any adverse and/or side effects to prescribing provider.  Therapeutic Interventions: 1 on 1 counseling sessions, Psychoeducation, Medication administration, Evaluate responses to treatment, Monitor vital  signs and CBGs as ordered, Perform/monitor CIWA, COWS, AIMS and Fall Risk screenings as ordered, Perform wound care treatments as ordered.  Evaluation of Outcomes: Progressing   LCSW Treatment Plan for Primary Diagnosis: Major depressive disorder, recurrent severe without psychotic features (HCC) Long Term Goal(s): Safe transition to appropriate next level of care at discharge, Engage patient in therapeutic group addressing interpersonal concerns.  Short Term Goals: Engage patient in aftercare planning with referrals and resources, Increase social support, Increase ability to appropriately verbalize feelings, Increase emotional regulation, Facilitate acceptance of mental health diagnosis and concerns, Facilitate patient progression through stages of change regarding substance use diagnoses and concerns, Identify triggers associated with mental health/substance abuse issues and Increase skills for wellness and recovery  Therapeutic Interventions: Assess for all discharge needs, 1 to 1 time with Social worker, Explore available resources and support systems, Assess for adequacy in community support network, Educate family and significant other(s) on suicide prevention, Complete Psychosocial Assessment, Interpersonal group therapy.  Evaluation of Outcomes: Progressing   Progress in Treatment: Attending groups: No Participating in groups: No Taking medication as prescribed: Yes, MD continues to assess for medication changes as needed Toleration medication: Yes, no side effects reported at this time Family/Significant other contact made: CSW, still assessing for appropriate contacts Patient understands diagnosis: Yes Discussing patient identified problems/goals with staff: Yes Medical problems stabilized or resolved: Yes Denies suicidal/homicidal ideation: Yes Issues/concerns per patient self-inventory: None Other: N/A  New problem(s) identified: None identified at this time.   New  Short  Term/Long Term Goal(s): None identified at this time.   Discharge Plan or Barriers: CSW still assessing for appropriate referrals  Reason for Continuation of Hospitalization: Anxiety Delusions  Depression Hallucinations Homicidal ideation Mania Suicidal ideation Withdrawal symptoms  Estimated Length of Stay/Date of discharge: 3-5 days  Attendees: Patient: Allena EaringSusy Ann Duhamel 10/27/2015  9:40 AM  Physician: Dr. Jennet MaduroPucilowska, MD 10/27/2015  9:40 AM  Nursing: Elenore PaddyJennifer Morrow, RN 10/27/2015  9:40 AM  RN Care Manager: 10/27/2015  9:40 AM  Social Worker: Dorothe PeaJonathan F. Durward ParcelRiffey, LCSWA, LCAS    10/27/2015  9:40 AM  Recreational Therapist: Hershal CoriaBeth Greene, LRT   10/27/2015  9:40 AM  Social Worker: Jake SharkSara Laws, LCSW 10/27/2015  9:40 AM  Other:  10/27/2015  9:40 AM  Other: 10/27/2015  9:40 AM    Scribe for Treatment Team:  Dorothe PeaJonathan F. Tylynn Braniff MSW, LCSWA, LCAS

## 2015-10-27 NOTE — Progress Notes (Signed)
Recreation Therapy Notes  Date: 08.29.17 Time: 9:30 am Location: Craft Room  Group Topic: Goal Setting  Goal Area(s) Addresses:  Patient will write at least one goal. Patient will write at least one obstacle.  Behavioral Response: Arrived late, Attentive, Interactive  Intervention: Recovery Goal Chart  Activity: Patients were instructed to make a Recovery Goal Chart including goals, obstacles, the date they started working on their goals, and the date they achieved their goals.  Education: LRT educated patients on ways they can celebrate reaching their goals in a healthy way.  Education Outcome: Acknowledges education/In group clarification offered  Clinical Observations/Feedback: Patient arrived to group at approximately 9:50 am. LRT explained activity. Patient wrote goals and obstacles. Patient contributed to group discussion by stating how she can keep herself focused on her goals.  Jacquelynn CreeGreene,Keani Gotcher M, LRT/CTRS 10/27/2015 10:12 AM

## 2015-10-27 NOTE — Progress Notes (Signed)
[facility] MD Progress Note  10/27/2015 1:51 PM Patricia [name]  MRN:  [id number]  Subjective: Ms. Mondesir reports feeling better today. Her thinking no longer is confused and she is able to answer our questions in treatment team meeting. She is short of hearing and this confuses her conversation. She seems very depressed with severe psychomotor retardation. She reports that she did take a shower for the first time today. She has not been participating in programming. She no longer has suicidal thoughts and is glad to be alive but is rather overwhelmed with relationship problems at home. She tolerates medications well. She does not participate in programming as of yet. She reported today that she has an appointment with her therapist tomorrow. Social worker will reschedule.  Principal Problem: Major depressive disorder, recurrent severe without psychotic features (HCC) Diagnosis:   Patient Active Problem List   Diagnosis Date Noted  . Major depressive disorder, recurrent severe without psychotic features (HCC) [F33.2] 09/28/2015    Priority: High  . Alcohol use disorder, moderate, in sustained remission (HCC) [F10.21] 10/24/2015  . Opiate overdose [T40.601A] 09/28/2015  . 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 Opioid use disorder, moderate, dependence (HCC) [F11.20] 09/28/2015  . 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 H/O eating disorder [Z86.59] 09/19/2013   Total Time spent with patient: 30 minutes  Past Psychiatric History: Depression, substance abuse.  Past Medical History:  Past Medical History:  Diagnosis Date  . Alcohol abuse unk  . Anxiety unk  . Aspiration into airway   . Depression unk    Past Surgical History:  Procedure Laterality Date  . ABDOMINAL HYSTERECTOMY    . FOOT SURGERY    . TRACHEAL SURGERY     Family History:  Family History  Problem Relation Age of Onset  . Breast cancer Mother   . Hypertension Mother   . Depression  Mother   . Anxiety disorder Mother   . Alcohol abuse Father   . Alcohol abuse Sister   . Anxiety disorder Sister   . Bipolar disorder Sister    Family Psychiatric  History: See H&P. Social History:  History  Alcohol Use No    Comment: former alcoholic "sober for a month now"     History  Drug Use No    Social History   Social History  . Marital status: Married    Spouse name: N/A  . Number of children: N/A  . Years of education: N/A   Social History Main Topics  . Smoking status: Former Smoker    Types: Cigarettes  . Smokeless tobacco: Never Used  . Alcohol use No     Comment: former alcoholic "sober for a month now"  . Drug use: No  . Sexual activity: Not Currently   Other Topics Concern  . None   Social History Narrative  . None   Additional Social History:    Pain Medications: see PTA meds Prescriptions: see PTA meds Over the Counter: see PTA meds History of alcohol / drug use?: Yes      Current Medications: Current Facility-Administered Medications  Medication Dose Route Frequency Provider Last Rate Last Dose  . acetaminophen (TYLENOL) tablet 650 mg  650 mg Oral Q6H PRN Audery Amel, MD      . alum & mag hydroxide-simeth (MAALOX/MYLANTA) 200-200-20 MG/5ML suspension 30 mL  30 mL Oral Q4H PRN Audery Amel, MD      . atorvastatin (LIPITOR) tablet 20 mg  20 mg Oral  [id number] Audery AmelJohn T Clapacs, MD   20 mg at 10/26/15 1652  . bacitracin ointment   Topical Daily Jimmy FootmanAndrea Hernandez-Gonzalez, MD      . chlordiazePOXIDE (LIBRIUM) capsule 10 mg  10 mg Oral TID Shari ProwsJolanta B Daniele Yankowski, MD   10 mg at 10/27/15 1152  . clindamycin (CLEOCIN) capsule 300 mg  300 mg Oral Q6H Jimmy FootmanAndrea Hernandez-Gonzalez, MD   300 mg at 10/27/15 1152  . FLUoxetine (PROZAC) capsule 40 mg  40 mg Oral Daily Audery AmelJohn T Clapacs, MD   40 mg at 10/27/15 0856  . lamoTRIgine (LAMICTAL) tablet 200 mg  200 mg Oral Daily Audery AmelJohn T Clapacs, MD   200 mg at 10/27/15 0856  . magnesium hydroxide (MILK OF MAGNESIA)  suspension 30 mL  30 mL Oral Daily PRN Audery AmelJohn T Clapacs, MD      . QUEtiapine (SEROQUEL) tablet 100 mg  100 mg Oral QHS Shari ProwsJolanta B Dillyn Joaquin, MD   100 mg at 10/26/15 2145  . traZODone (DESYREL) tablet 150 mg  150 mg Oral QHS PRN Jimmy FootmanAndrea Hernandez-Gonzalez, MD   150 mg at 10/25/15 2145    Lab Results: No results found for this or any previous visit (from the past 48 hour(s)).  Blood Alcohol level:  Lab Results  Component Value Date   ETH <5 10/23/2015   ETH <5 09/27/2015    Metabolic Disorder Labs: Lab Results  Component Value Date   HGBA1C 4.9 10/23/2015   MPG 103 08/17/2013   Lab Results  Component Value Date   PROLACTIN 127.2 (H) 10/23/2015   PROLACTIN 78.6 (H) 09/30/2015   Lab Results  Component Value Date   CHOL 198 10/23/2015   TRIG 72 10/23/2015   HDL 85 10/23/2015   CHOLHDL 2.3 10/23/2015   VLDL 14 10/23/2015   LDLCALC 99 10/23/2015   LDLCALC 73 09/30/2015    Physical Findings: AIMS: Facial and Oral Movements Muscles of Facial Expression: None, normal Lips and Perioral Area: None, normal Jaw: None, normal Tongue: None, normal,Extremity Movements Upper (arms, wrists, hands, fingers): None, normal Lower (legs, knees, ankles, toes): None, normal, Trunk Movements Neck, shoulders, hips: None, normal, Overall Severity Severity of abnormal movements (highest score from questions above): None, normal Incapacitation due to abnormal movements: None, normal Patient's awareness of abnormal movements (rate only patient's report): No Awareness, Dental Status Current problems with teeth and/or dentures?: No Does patient usually wear dentures?: No  CIWA:    COWS:     Musculoskeletal: Strength & Muscle Tone: within normal limits Gait & Station: normal Patient leans: N/A  Psychiatric Specialty Exam: Physical Exam  Nursing note and vitals reviewed. Constitutional: She is oriented to person, place, and time.  Musculoskeletal: Normal range of motion.  Neurological: She  is oriented to person, place, and time.    Review of Systems  Psychiatric/Behavioral: Positive for depression and substance abuse.  All other systems reviewed and are negative.   Blood pressure 124/78, pulse 75, temperature 98.3 F (36.8 C), temperature source Oral, resp. rate 18, height 5\' 5"  (1.651 m), weight 67.1 kg (148 lb), SpO2 99 %.Body mass index is 24.63 kg/m.  General Appearance: Disheveled  Eye Contact:  Poor  Speech:  Slow  Volume:  Decreased  Mood:  Dysphoric  Affect:  Blunt  Thought Process:  Disorganized  Orientation:  Full (Time, Place, and Person)  Thought Content:  NA  Suicidal Thoughts:  No  Homicidal Thoughts:  No  Memory:  Immediate;   NA Recent;   NA Remote;   NA  Judgement:  Poor  Insight:  Shallow  Psychomotor Activity:  Decreased  Concentration:  Concentration: NA and Attention Span: NA  Recall:  NA  Fund of Knowledge:  NA  Language:  Good  Akathisia:  No  Handed:    AIMS (if indicated):     Assets:  Physical Health  ADL's:  Intact  Cognition:  WNL  Sleep:  Number of Hours: 7.5     Treatment Plan Summary: Daily contact with patient to assess and evaluate symptoms and progress in treatment and Medication management  Ms. Astarita is a 55 year old female with history of depression and mood lability admitted after suicide attempt by opiate overdose.   1. Suicidal ideation. The patient is able to contract for safety in the hospital.   2. Major depressive disorder. We continued fluoxetine, Lamictal and Seroquel for depression and mood stabilization.     3. Benzodiazepine use. The patient has been prescribed alprazolam 1 mg 3 times a day by her PCP. We continue librium taper.   4. Opiate, benzodiazepine, amphetamine use disorder, and alcohol use disorder in remission. This patient is in need of outpatient or inpatient substance abuse treatment   5. Dyslipidemia. We continued Lipitor 20 mg a day  6. Self-inflicted injury: Patient cut her left  wrist and had signs of cellulitis. This is improving with clindamycin.   7. Metabolic syndrome monitoring. Lipid profile, hemoglobin A1c, and TSH are normal. Prolactin 127.    8. EKG. Normal sinus rhythm. QT 390.  9. Disposition. She will be discharged with her husband. She will follow up with [facility].  Kristine Linea, MD 10/27/2015, 1:51 PM

## 2015-10-27 NOTE — Plan of Care (Signed)
Problem: Safety: Goal: Periods of time without injury will increase Outcome: Progressing Patient has been without injury during this shift.    

## 2015-10-27 NOTE — Progress Notes (Signed)
Patient with depressed affect, cooperative behavior with meals, meds and plan of care. No SI/HI at this time. Dressing change this am to wrist with decreasing redness, decreased swelling and no drainage. Bacitracin, non stick telfa and kerlix applied. Dressing changed after shower at patient request. Discussed with patient importance of identifying stressors leading up to feeling overwhelmed and suicidal. Discussed introducing positive coping skills. Patient states her husband does not listen to her. Patient encouraged to reach out to sisters by phone for more contact. Patient states she does have a therapist. Patient with increasing eye contact, increasing brightness to affect. Interested in therapy group. Safety maintained. Completes daily audit sheet with being more involved and staying around people as her goal. Safety maintained.

## 2015-10-27 NOTE — Progress Notes (Signed)
D: Patient appears somewhat confused. She is slow to answer questions. She currently denies SI/HI/AVH. Denies any pain. Bandage clean, dry, and intact.  A: Medication given with education. Encouragement provided.  R: Patient was compliant with medication. She has remained calm and cooperative. Safety maintained with 15 min checks.

## 2015-10-27 NOTE — [facility] Group Notes (Signed)
[facility] LCSW Group Therapy  10/27/2015 4:50 PM  Type of Therapy/Topic:  Group Therapy:  Feelings about Diagnosis  Participation Level:  Active   Mood: Verbalizes mood is okay.   Description of Group:    This group will allow patients to explore their thoughts and feelings about diagnoses they have received. Patients will be guided to explore their level of understanding and acceptance of these diagnoses. Facilitator will encourage patients to process their thoughts and feelings about the reactions of others to their diagnosis, and will guide patients in identifying ways to discuss their diagnosis with significant others in their lives. This group will be process-oriented, with patients participating in exploration of their own experiences as well as giving and receiving support and challenge from other group members.   Therapeutic Goals: 1. Patient will demonstrate understanding of diagnosis as evidence by identifying two or more symptoms of the disorder:  2. Patient will be able to express two feelings regarding the diagnosis 3. Patient will demonstrate ability to communicate their needs through discussion and/or role plays  Summary of Patient Progress:  Pt able to meet therapeutic goals. Shared some things that she feels she needs to be well from her husband.  Pt unsure in communicating these needs to him as she says he over-uses his pain medications and she fears he is just unable to be there for her due to this.  Group brainstormed other areas/places that support might be available when it cannot be gotten from the people we want it most.  Supportive of other Pts and received support in return.   Therapeutic Modalities:   Cognitive Behavioral Therapy Brief Therapy Feelings Identification  Glennon MacSara P Skyrah Krupp, MSW, LCSW 10/27/2015, 4:50 PM

## 2015-10-27 NOTE — Plan of Care (Signed)
Problem: Education: Goal: Knowledge of [facility] Education information/materials will improve Outcome: Progressing Discussed triggers for self harm, increasing positive support people and use of positive coping skills.

## 2015-10-28 MED ORDER — VENLAFAXINE HCL ER 75 MG PO CP24
150.0000 mg | ORAL_CAPSULE | Freq: Once | ORAL | Status: AC
Start: 2015-10-28 — End: 2015-10-28
  Administered 2015-10-28: 150 mg via ORAL
  Filled 2015-10-28: qty 2

## 2015-10-28 MED ORDER — QUETIAPINE FUMARATE 200 MG PO TABS
300.0000 mg | ORAL_TABLET | Freq: Every day | ORAL | Status: DC
  Administered 2015-10-28 – 2015-10-29 (×2): 300 mg via ORAL
  Filled 2015-10-28 (×2): qty 1

## 2015-10-28 MED ORDER — VENLAFAXINE HCL ER 75 MG PO CP24
150.0000 mg | ORAL_CAPSULE | Freq: Every day | ORAL | Status: DC
  Administered 2015-10-29 – 2015-10-30 (×2): 150 mg via ORAL
  Filled 2015-10-28 (×2): qty 2

## 2015-10-28 NOTE — Plan of Care (Signed)
Problem: Coping: Goal: Ability to demonstrate self-control will improve Outcome: Progressing Pt demonstrating appropriate self-control.     
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
## 2015-10-28 NOTE — Progress Notes (Signed)
D: Observed pt in day room interacting with peers. Patient alert and oriented x4. Patient denies SI/HI/AVH. Pt affect is sad and depressed. Pt interacting with peers, but minimally with staff this shift. Pt stated her mood is "ok." Pt had questions about Bipolar diagnosis and medications. Pt had no complaints. Dressing on left forearm clean, dry and intact. A: Offered active listening and support. Provided therapeutic communication. Administered scheduled medications. Encouraged pt to attend groups and actively participate in care. Encouraged pt to share thoughts and feelings with staff.  R: Pt pleasant and cooperative. Pt medication compliant. Will continue Q15 min. checks. Safety maintained.

## 2015-10-28 NOTE — Progress Notes (Signed)
[facility] MD Progress Note  10/28/2015 2:42 PM Patricia [name]  MRN:  [id number]  Subjective: Patricia [name] still seems depressed with severe psychomotor retardation and suicidal thinking. She did not sleep last night. She started participating in programming. She tolerates medications well but her progress is minimal.   Principal Problem: Major depressive disorder, recurrent severe without psychotic features (HCC) Diagnosis:   Patient Active Problem List   Diagnosis Date Noted  . Major depressive disorder, recurrent severe without psychotic features (HCC) [F33.2] 09/28/2015    Priority: High  . Alcohol use disorder, moderate, in sustained remission (HCC) [F10.21] 10/24/2015  . Opiate overdose [T40.601A] 09/28/2015  . 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 Opioid use disorder, moderate, dependence (HCC) [F11.20] 09/28/2015  . 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 H/O eating disorder [Z86.59] 09/19/2013   Total Time spent with patient: 30 minutes  Past Psychiatric History: Depression, substance abuse.  Past Medical History:  Past Medical History:  Diagnosis Date  . Alcohol abuse unk  . Anxiety unk  . Aspiration into airway   . Depression unk    Past Surgical History:  Procedure Laterality Date  . ABDOMINAL HYSTERECTOMY    . FOOT SURGERY    . TRACHEAL SURGERY     Family History:  Family History  Problem Relation Age of Onset  . Breast cancer Mother   . Hypertension Mother   . Depression Mother   . Anxiety disorder Mother   . Alcohol abuse Father   . Alcohol abuse Sister   . Anxiety disorder Sister   . Bipolar disorder Sister    Family Psychiatric  History: See H&P. Social History:  History  Alcohol Use No    Comment: former alcoholic "sober for a month now"     History  Drug Use No    Social History   Social History  . Marital status: Married    Spouse name: N/A  . Number of children: N/A  . Years of education: N/A    Social History Main Topics  . Smoking status: Former Smoker    Types: Cigarettes  . Smokeless tobacco: Never Used  . Alcohol use No     Comment: former alcoholic "sober for a month now"  . Drug use: No  . Sexual activity: Not Currently   Other Topics Concern  . None   Social History Narrative  . None   Additional Social History:    Pain Medications: see PTA meds Prescriptions: see PTA meds Over the Counter: see PTA meds History of alcohol / drug use?: Yes      Current Medications: Current Facility-Administered Medications  Medication Dose Route Frequency Provider Last Rate Last Dose  . acetaminophen (TYLENOL) tablet 650 mg  650 mg Oral Q6H PRN Audery Amel, MD      . alum & mag hydroxide-simeth (MAALOX/MYLANTA) 200-200-20 MG/5ML suspension 30 mL  30 mL Oral Q4H PRN Audery Amel, MD      . atorvastatin (LIPITOR) tablet 20 mg  20 mg Oral q1800 Audery Amel, MD   20 mg at 10/27/15 1704  . bacitracin ointment   Topical Daily Jimmy Footman, MD      . clindamycin (CLEOCIN) capsule 300 mg  300 mg Oral Q6H Jimmy Footman, MD   300 mg at 10/28/15 1122  . lamoTRIgine (LAMICTAL) tablet 200 mg  200 mg Oral Daily Audery Amel, MD   200 mg at 10/28/15 0841  . magnesium hydroxide (MILK OF MAGNESIA)  suspension 30 mL  30 mL Oral Daily PRN Audery Amel, MD      . QUEtiapine (SEROQUEL) tablet 300 mg  300 mg Oral QHS Jolanta B Pucilowska, MD      . Melene Muller ON 10/29/2015] venlafaxine XR (EFFEXOR-XR) 24 hr capsule 150 mg  150 mg Oral Q breakfast Jolanta B Pucilowska, MD      . venlafaxine XR (EFFEXOR-XR) 24 hr capsule 150 mg  150 mg Oral Once Jolanta B Pucilowska, MD        Lab Results: No results found for this or any previous visit (from the past 48 hour(s)).  Blood Alcohol level:  Lab Results  Component Value Date   ETH <5 10/23/2015   ETH <5 09/27/2015    Metabolic Disorder Labs: Lab Results  Component Value Date   HGBA1C 4.9 10/23/2015   MPG 103  08/17/2013   Lab Results  Component Value Date   PROLACTIN 127.2 (H) 10/23/2015   PROLACTIN 78.6 (H) 09/30/2015   Lab Results  Component Value Date   CHOL 198 10/23/2015   TRIG 72 10/23/2015   HDL 85 10/23/2015   CHOLHDL 2.3 10/23/2015   VLDL 14 10/23/2015   LDLCALC 99 10/23/2015   LDLCALC 73 09/30/2015    Physical Findings: AIMS: Facial and Oral Movements Muscles of Facial Expression: None, normal Lips and Perioral Area: None, normal Jaw: None, normal Tongue: None, normal,Extremity Movements Upper (arms, wrists, hands, fingers): None, normal Lower (legs, knees, ankles, toes): None, normal, Trunk Movements Neck, shoulders, hips: None, normal, Overall Severity Severity of abnormal movements (highest score from questions above): None, normal Incapacitation due to abnormal movements: None, normal Patient's awareness of abnormal movements (rate only patient's report): No Awareness, Dental Status Current problems with teeth and/or dentures?: No Does patient usually wear dentures?: No  CIWA:    COWS:     Musculoskeletal: Strength & Muscle Tone: within normal limits Gait & Station: normal Patient leans: N/A  Psychiatric Specialty Exam: Physical Exam  Nursing note and vitals reviewed. Constitutional: She is oriented to person, place, and time.  Musculoskeletal: Normal range of motion.  Neurological: She is oriented to person, place, and time.    Review of Systems  Psychiatric/Behavioral: Positive for depression and substance abuse.  All other systems reviewed and are negative.   Blood pressure (!) 86/69, pulse 79, temperature 98.3 F (36.8 C), temperature source Oral, resp. rate 18, height 5\' 5"  (1.651 m), weight 67.1 kg (148 lb), SpO2 99 %.Body mass index is 24.63 kg/m.  General Appearance: Disheveled  Eye Contact:  Poor  Speech:  Slow  Volume:  Decreased  Mood:  Dysphoric  Affect:  Blunt  Thought Process:  Disorganized  Orientation:  Full (Time, Place, and  Person)  Thought Content:  NA  Suicidal Thoughts:  No  Homicidal Thoughts:  No  Memory:  Immediate;   NA Recent;   NA Remote;   NA  Judgement:  Poor  Insight:  Shallow  Psychomotor Activity:  Decreased  Concentration:  Concentration: NA and Attention Span: NA  Recall:  NA  Fund of Knowledge:  NA  Language:  Good  Akathisia:  No  Handed:    AIMS (if indicated):     Assets:  Physical Health  ADL's:  Intact  Cognition:  WNL  Sleep:  Number of Hours: 7     Treatment Plan Summary: Daily contact with patient to assess and evaluate symptoms and progress in treatment and Medication management  Patricia [name] is a 55 year old female  with history of depression and mood lability admitted after suicide attempt by opiate overdose.   1. Suicidal ideation. The patient is able to contract for safety in the hospital.   2. Major depressive disorder. We continued Lamictal and increased Seroquel to 300 mg for depression and mood stabilization.  We discontinueProzac and started Effexor.   3. Benzodiazepine use. The patient has been prescribed alprazolam 1 mg 3 times a day by her PCP. She completed librium taper.   4. Opiate, benzodiazepine, amphetamine use disorder, and alcohol use disorder in remission. This patient is in need of outpatient or inpatient substance abuse treatment   5. Dyslipidemia. We continued Lipitor 20 mg a day  6. Self-inflicted injury: Patient cut her left wrist and had signs of cellulitis. This is improving with clindamycin.   7. Metabolic syndrome monitoring. Lipid profile, hemoglobin A1c, and TSH are normal. Prolactin 127.    8. EKG. Normal sinus rhythm. QT 390.  9. Disposition. She will be discharged with her husband. She will follow up with [facility].  Kristine LineaJolanta Pucilowska, MD 10/28/2015, 2:42 PM

## 2015-10-28 NOTE — Progress Notes (Signed)
Patient with depressed affect, cooperative behavior with meals, meds and plan of care. No SI/HI at this time. Affect slightly brighter, mood remains depressed. Minimal interaction with peers. Dressing change to self inflicted wound to wrist with no s/s of infection. Cleocin and bacitracin in progress. Therapy groups encouraged to learn and initiate positive coping skills. Patient with limited insight. Safety maintained.

## 2015-10-28 NOTE — [facility] Group Notes (Signed)
[facility] Group Notes:  (Nursing/MHT/Case Management/Adjunct)  Date:  10/28/2015  Time:  5:22 AM  Type of Therapy:  Psychoeducational Skills  Participation Level:  Active  Participation Quality:  Appropriate  Affect:  Appropriate  Cognitive:  Alert and Appropriate  Insight:  Appropriate, Good and Improving  Engagement in Group:  Engaged and Supportive  Modes of Intervention:  Discussion, Socialization and Support  Summary of Progress/Problems:  Chancy MilroyLaquanda Y Prithvi Kooi 10/28/2015, 5:22 AM

## 2015-10-28 NOTE — [facility] Group Notes (Signed)
[facility] Group Notes:  (Nursing/MHT/Case Management/Adjunct)  Date:  10/28/2015  Time:  10:33 PM  Type of Therapy:  Evening Wrap-up Group  Participation Level:  Active  Participation Quality:  Appropriate and Attentive  Affect:  Appropriate  Cognitive:  Alert and Appropriate  Insight:  Appropriate, Good and Improving  Engagement in Group:  Engaged  Modes of Intervention:  Activity and Discussion  Summary of Progress/Problems:  Patricia MorrowChelsea [name] Patricia [name] 10/28/2015, 10:33 PM

## 2015-10-29 MED ORDER — QUETIAPINE FUMARATE 300 MG PO TABS: 300.0000 mg | ORAL_TABLET | Freq: Every day | ORAL | 1 refills | Status: DC

## 2015-10-29 MED ORDER — DOXEPIN HCL 50 MG PO CAPS
100.0000 mg | ORAL_CAPSULE | Freq: Every day | ORAL | Status: DC
  Administered 2015-10-29: 100 mg via ORAL
  Filled 2015-10-29: qty 2

## 2015-10-29 MED ORDER — LAMOTRIGINE 200 MG PO TABS: 200.0000 mg | ORAL_TABLET | Freq: Every day | ORAL | 1 refills | Status: AC

## 2015-10-29 MED ORDER — CLINDAMYCIN HCL 300 MG PO CAPS: 300.0000 mg | ORAL_CAPSULE | Freq: Four times a day (QID) | ORAL | 0 refills | Status: DC

## 2015-10-29 MED ORDER — VENLAFAXINE HCL ER 150 MG PO CP24: 150.0000 mg | ORAL_CAPSULE | Freq: Every day | ORAL | 1 refills | Status: DC

## 2015-10-29 MED ORDER — DOXEPIN HCL 100 MG PO CAPS: 100.0000 mg | ORAL_CAPSULE | Freq: Every day | ORAL | 1 refills | Status: DC

## 2015-10-29 MED ORDER — NALOXONE HCL 4 MG/0.1ML NA LIQD: NASAL | 2 refills | Status: DC

## 2015-10-29 NOTE — [facility] Group Notes (Signed)
[facility] LCSW Group Therapy  10/29/2015 12:06 PM  Type of Therapy:  Group Therapy  Participation Level:  Active  Participation Quality:  Attentive and Sharing  Affect:  Blunted  Cognitive:  Alert  Insight:  Improving  Engagement in Therapy:  Improving  Modes of Intervention:  Activity, Discussion, Education and Support  Summary of Progress/Problems:Balance in life: Patients will discuss the concept of balance and how it looks and feels to be unbalanced. Pt will identify areas in their life that is unbalanced and ways to become more balanced. Pt stated she needs to communicate more with her husband. Pt also stated taking her medications is a component of her wellbeing that she has to maintain when she discharges.    Danyele Smejkal G. Garnette CzechSampson MSW, LCSWA 10/29/2015, 12:08 PM

## 2015-10-29 NOTE — Plan of Care (Signed)
Problem: Safety: Goal: Ability to remain free from injury will improve Outcome: Progressing Medications administered as ordered (Seroquel 300 mg and Antibiotic) by the physician, medications Therapeutic Effects, SEs and Adverse effects discussed, questions encouraged; no PRN given, 15 minute checks maintained for safety, clinical and moral support provided, patient encouraged to continue to express feelings and demonstrate safe care. Patient remain free from harm, will continue to monitor.

## 2015-10-29 NOTE — Progress Notes (Signed)
Visible in the milieu, interacting well with others, denied SI/HI, happy, no depressive symptoms, occlusive dressing to left wrist and fore arm intact, verbalized understanding of why she on Clindamycin. No behavioral problems.

## 2015-10-29 NOTE — Tx Team (Signed)
Interdisciplinary Treatment and Diagnostic Plan Update  10/29/2015 Time of Session: 11:20 AM  Patricia [name] MRN: [id number]  Principal Diagnosis: Major depressive disorder, recurrent severe without psychotic features (HCC)  Secondary Diagnoses: Principal Problem:   Major depressive disorder, recurrent severe without psychotic features (HCC) Active Problems:   Opiate overdose   Alcohol use disorder, moderate, in sustained remission (HCC)   Current Medications:  Current Facility-Administered Medications  Medication Dose Route Frequency Provider Last Rate Last Dose  . acetaminophen (TYLENOL) tablet 650 mg  650 mg Oral Q6H PRN Audery AmelJohn T Clapacs, MD      . alum & mag hydroxide-simeth (MAALOX/MYLANTA) 200-200-20 MG/5ML suspension 30 mL  30 mL Oral Q4H PRN Audery AmelJohn T Clapacs, MD      . atorvastatin (LIPITOR) tablet 20 mg  20 mg Oral q1800 Audery AmelJohn T Clapacs, MD   20 mg at 10/28/15 1705  . bacitracin ointment   Topical Daily Jimmy FootmanAndrea Hernandez-Gonzalez, MD   1 application at 10/29/15 [phone]  . clindamycin (CLEOCIN) capsule 300 mg  300 mg Oral Q6H Jimmy FootmanAndrea Hernandez-Gonzalez, MD   300 mg at 10/29/15 0612  . doxepin (SINEQUAN) capsule 100 mg  100 mg Oral QHS Jolanta B Pucilowska, MD      . lamoTRIgine (LAMICTAL) tablet 200 mg  200 mg Oral Daily Audery AmelJohn T Clapacs, MD   200 mg at 10/29/15 0855  . magnesium hydroxide (MILK OF MAGNESIA) suspension 30 mL  30 mL Oral Daily PRN Audery AmelJohn T Clapacs, MD      . QUEtiapine (SEROQUEL) tablet 300 mg  300 mg Oral QHS Jolanta B Pucilowska, MD   300 mg at 10/28/15 2121  . venlafaxine XR (EFFEXOR-XR) 24 hr capsule 150 mg  150 mg Oral Q breakfast Jolanta B Pucilowska, MD   150 mg at 10/29/15 0855    PTA Medications: Prescriptions Prior to Admission  Medication Sig Dispense Refill Last Dose  . ALPRAZolam (XANAX) 1 MG tablet Take 1 mg by mouth 3 (three) times daily.   unknown at unknown  . atorvastatin (LIPITOR) 20 MG tablet Take 20 mg by mouth at bedtime.   unknown at unknown  .  FLUoxetine (PROZAC) 40 MG capsule Take 2 capsules (80 mg total) by mouth daily. 180 capsule 1 unknown at unknown  . folic acid (FOLVITE) 1 MG tablet Take 1 tablet (1 mg total) by mouth daily. (Patient taking differently: Take 1 mg by mouth at bedtime. ) 30 tablet 0 unknown at unknown  . lamoTRIgine (LAMICTAL) 200 MG tablet Take 1 tablet (200 mg total) by mouth daily. 90 tablet 1 unknown at unknown  . naloxone HCl 4 MG/0.1ML LIQD administer in case of opioid overdose. 2 each 2 prn at prn  . QUEtiapine (SEROQUEL) 200 MG tablet Take 1 tablet (200 mg total) by mouth at bedtime. 30 tablet 1 unknown at unknown    Treatment Modalities: Medication Management, Group therapy, Case management,  1 to 1 session with clinician, Psychoeducation, Recreational therapy.   Physician Treatment Plan for Primary Diagnosis: Major depressive disorder, recurrent severe without psychotic features (HCC) Long Term Goal(s): Improvement in symptoms so as ready for discharge  Short Term Goals: Ability to identify changes in lifestyle to reduce recurrence of condition will improve, Ability to verbalize feelings will improve, Ability to disclose and discuss suicidal ideas, Ability to demonstrate self-control will improve, Ability to identify and develop effective coping behaviors will improve, Ability to maintain clinical measurements within normal limits will improve and Compliance with prescribed medications will improve  Medication Management:  Evaluate patient's response, side effects, and tolerance of medication regimen.  Therapeutic Interventions: 1 to 1 sessions, Unit Group sessions and Medication administration.  Evaluation of Outcomes: Adequate for discharge   Physician Treatment Plan for Secondary Diagnosis: Principal Problem:   Major depressive disorder, recurrent severe without psychotic features (HCC) Active Problems:   Opiate overdose   Alcohol use disorder, moderate, in sustained remission (HCC)   Long  Term Goal(s): Improvement in symptoms so as ready for discharge  Short Term Goals: Ability to identify changes in lifestyle to reduce recurrence of condition will improve, Ability to verbalize feelings will improve, Ability to disclose and discuss suicidal ideas, Ability to demonstrate self-control will improve, Ability to identify and develop effective coping behaviors will improve, Ability to maintain clinical measurements within normal limits will improve and Compliance with prescribed medications will improve  Medication Management: Evaluate patient's response, side effects, and tolerance of medication regimen.  Therapeutic Interventions: 1 to 1 sessions, Unit Group sessions and Medication administration.  Evaluation of Outcomes: Adequate for discharge per MD  RN Treatment Plan for Primary Diagnosis: Major depressive disorder, recurrent severe without psychotic features (HCC) Long Term Goal(s): Knowledge of disease and therapeutic regimen to maintain health will improve  Short Term Goals: Ability to remain free from injury will improve, Ability to verbalize frustration and anger appropriately will improve, Ability to demonstrate self-control, Ability to participate in decision making will improve, Ability to verbalize feelings will improve, Ability to disclose and discuss suicidal ideas, Ability to identify and develop effective coping behaviors will improve and Compliance with prescribed medications will improve  Medication Management: RN will administer medications as ordered by provider, will assess and evaluate patient's response and provide education to patient for prescribed medication. RN will report any adverse and/or side effects to prescribing provider.  Therapeutic Interventions: 1 on 1 counseling sessions, Psychoeducation, Medication administration, Evaluate responses to treatment, Monitor vital signs and CBGs as ordered, Perform/monitor CIWA, COWS, AIMS and Fall Risk screenings as  ordered, Perform wound care treatments as ordered.  Evaluation of Outcomes: Adequate for discharge per MD   LCSW Treatment Plan for Primary Diagnosis: Major depressive disorder, recurrent severe without psychotic features (HCC) Long Term Goal(s): Safe transition to appropriate next level of care at discharge, Engage patient in therapeutic group addressing interpersonal concerns.  Short Term Goals: Engage patient in aftercare planning with referrals and resources, Increase social support, Increase ability to appropriately verbalize feelings, Increase emotional regulation, Facilitate acceptance of mental health diagnosis and concerns, Facilitate patient progression through stages of change regarding substance use diagnoses and concerns, Identify triggers associated with mental health/substance abuse issues and Increase skills for wellness and recovery  Therapeutic Interventions: Assess for all discharge needs, 1 to 1 time with Social worker, Explore available resources and support systems, Assess for adequacy in community support network, Educate family and significant other(s) on suicide prevention, Complete Psychosocial Assessment, Interpersonal group therapy.  Evaluation of Outcomes: Adequate for discharge per MD   Progress in Treatment: Attending groups: Yes Participating in groups: Yes Taking medication as prescribed: Yes, MD continues to assess for medication changes as needed Toleration medication: Yes, no side effects reported at this time Family/Significant other contact made: CSW, still assessing for appropriate contacts Patient understands diagnosis: Yes Discussing patient identified problems/goals with staff: Yes Medical problems stabilized or resolved: Yes Denies suicidal/homicidal ideation: Yes Issues/concerns per patient self-inventory: None Other: N/A  New problem(s) identified: None identified at this time.   New Short Term/Long Term Goal(s): None identified at this  time.    Discharge Plan or Barriers: Pt will discharge home to [facility] to live with her husband to and will follow up with [facility] for substance abuse treatment, medication management and therapy.    Reason for Continuation of Hospitalization: Anxiety Depression Suicidal ideation Withdrawal symptoms  Estimated Length of Stay/Date of discharge: 3-5 days  Attendees: Patient: Patricia [name] 10/29/2015  9:40 AM  Physician: Dr. Jennet Maduro, MD 10/29/2015  9:40 AM  Nursing: Leonia Reader, RN 10/29/2015  9:40 AM  RN Care Manager:  10/29/2015  9:40 AM  Social Worker: Dorothe Pea. Roylene Reason, LCAS   10/29/2015  9:40 AM  Recreational Therapist:                       10/29/2015  9:40 AM  Social Worker: Jerald Kief 10/29/2015  9:40 AM  Other:  10/29/2015  9:40 AM  Other: 10/29/2015  9:40 AM    Scribe for Treatment Team:  Dorothe Pea. Esco Joslyn MSW, LCSWA, LCAS

## 2015-10-29 NOTE — Plan of Care (Signed)
Problem: Skin Integrity: Goal: Risk for impaired skin integrity will decrease Outcome: Progressing Patient received dressing change to wound on left wrist.  Wound cleaned with normal saline and bacitracin applied and nonadherent pad was placed on the wound and wrapped in Kerlix.  Patient denied pain and no discharge was noted.  Will continue to monitor.

## 2015-10-29 NOTE — Progress Notes (Signed)
Patient denies SI/AVH/HI.  Patient has been ambulating the halls and has been more interactive with her peers and patient is attending group.  Patient was administered scheduled medications and patient was med compliant.  Patient dressing was changed and wound was cleansed and bacitracin applied and wound was rewrapped with nonadherent pad and kerlix.  Patient was pleasant and cooperative.  Patient safety maintained with 15 minute checks.

## 2015-10-29 NOTE — [facility] Group Notes (Signed)
[facility] Group Notes:  (Nursing/MHT/Case Management/Adjunct)  Date:  10/29/2015  Time:  4:31 PM  Type of Therapy:  Psychoeducational Skills  Participation Level:  Active  Participation Quality:  Appropriate, Sharing and Supportive  Affect:  Appropriate  Cognitive:  Appropriate  Insight:  Appropriate  Engagement in Group:  Engaged and Supportive  Modes of Intervention:  Discussion, Education and Support  Summary of Progress/Problems:  Patricia PennerKristen J Chrisha [name] 10/29/2015, 4:31 PM

## 2015-10-29 NOTE — Progress Notes (Signed)
[facility] MD Progress Note  10/29/2015 1:07 PM Patricia [name]  MRN:  [id number]  Subjective: Patricia [name] met with treatment team this morning. Patricia [name] feels better but did not sleep last night in spite of the increased dose of Seroquel. Patricia [name] now remembers that Patricia [name] has restless legs that have been treated with doxepin. Patricia [name] still feels somewhat depressed and we switched her from Prozac to Effexor. Patricia [name] tolerates medications well. There is some group participation. Patricia [name] would like to be discharged before the weekend.    Principal Problem: Major depressive disorder, recurrent severe without psychotic features ([place]) Diagnosis:   Patient Active Problem List   Diagnosis Date Noted  . Major depressive disorder, recurrent severe without psychotic features ([place]) [F33.2] 09/28/2015    Priority: High  . Alcohol use disorder, moderate, in sustained remission ([place]) [F10.21] 10/24/2015  . Opiate overdose [T40.601A] 09/28/2015  . Stimulant use disorder ([place]) [F15.90] 09/28/2015  . Opioid use disorder, moderate, dependence ([place]) [F11.20] 09/28/2015  . 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 H/O eating disorder [Z86.59] 09/19/2013   Total Time spent with patient: 30 minutes  Past Psychiatric History: Depression, substance abuse.  Past Medical History:  Past Medical History:  Diagnosis Date  . Alcohol abuse unk  . Anxiety unk  . Aspiration into airway   . Depression unk    Past Surgical History:  Procedure Laterality Date  . ABDOMINAL HYSTERECTOMY    . FOOT SURGERY    . TRACHEAL SURGERY     Family History:  Family History  Problem Relation Age of Onset  . Breast cancer Mother   . Hypertension Mother   . Depression Mother   . Anxiety disorder Mother   . Alcohol abuse Father   . Alcohol abuse Sister   . Anxiety disorder Sister   . Bipolar disorder Sister    Family Psychiatric  History: See H&P. Social History:  History  Alcohol Use No    Comment: former alcoholic  "sober for a month now"     History  Drug Use No    Social History   Social History  . Marital status: Married    Spouse name: N/A  . Number of children: N/A  . Years of education: N/A   Social History Main Topics  . Smoking status: Former Smoker    Types: Cigarettes  . Smokeless tobacco: Never Used  . Alcohol use No     Comment: former alcoholic "sober for a month now"  . Drug use: No  . Sexual activity: Not Currently   Other Topics Concern  . None   Social History Narrative  . None   Additional Social History:    Pain Medications: see PTA meds Prescriptions: see PTA meds Over the Counter: see PTA meds History of alcohol / drug use?: Yes      Current Medications: Current Facility-Administered Medications  Medication Dose Route Frequency Provider Last Rate Last Dose  . acetaminophen (TYLENOL) tablet 650 mg  650 mg Oral Q6H PRN Gonzella Lex, MD      . alum & mag hydroxide-simeth (MAALOX/MYLANTA) 200-200-20 MG/5ML suspension 30 mL  30 mL Oral Q4H PRN Gonzella Lex, MD      . atorvastatin (LIPITOR) tablet 20 mg  20 mg Oral q1800 Gonzella Lex, MD   20 mg at 10/28/15 1705  . bacitracin ointment   Topical Daily Hildred Priest, MD   1 application at 27/03/50 [phone]  . clindamycin (CLEOCIN) capsule 300 mg  300 mg  Oral Q6H Hildred Priest, MD   300 mg at 10/29/15 1204  . doxepin (SINEQUAN) capsule 100 mg  100 mg Oral QHS Cali Cuartas B Malick Netz, MD      . lamoTRIgine (LAMICTAL) tablet 200 mg  200 mg Oral Daily Gonzella Lex, MD   200 mg at 10/29/15 0855  . magnesium hydroxide (MILK OF MAGNESIA) suspension 30 mL  30 mL Oral Daily PRN Gonzella Lex, MD      . QUEtiapine (SEROQUEL) tablet 300 mg  300 mg Oral QHS Yaziel Brandon B Menachem Urbanek, MD   300 mg at 10/28/15 2121  . venlafaxine XR (EFFEXOR-XR) 24 hr capsule 150 mg  150 mg Oral Q breakfast Jamieka Royle B Dylin Ihnen, MD   150 mg at 10/29/15 5701    Lab Results: No results found for this or any previous visit  (from the past 48 hour(s)).  Blood Alcohol level:  Lab Results  Component Value Date   ETH <5 10/23/2015   ETH <5 77/93/9030    Metabolic Disorder Labs: Lab Results  Component Value Date   HGBA1C 4.9 10/23/2015   MPG 103 08/17/2013   Lab Results  Component Value Date   PROLACTIN 127.2 (H) 10/23/2015   PROLACTIN 78.6 (H) 09/30/2015   Lab Results  Component Value Date   CHOL 198 10/23/2015   TRIG 72 10/23/2015   HDL 85 10/23/2015   CHOLHDL 2.3 10/23/2015   VLDL 14 10/23/2015   LDLCALC 99 10/23/2015   LDLCALC 73 09/30/2015    Physical Findings: AIMS: Facial and Oral Movements Muscles of Facial Expression: None, normal Lips and Perioral Area: None, normal Jaw: None, normal Tongue: None, normal,Extremity Movements Upper (arms, wrists, hands, fingers): None, normal Lower (legs, knees, ankles, toes): None, normal, Trunk Movements Neck, shoulders, hips: None, normal, Overall Severity Severity of abnormal movements (highest score from questions above): None, normal Incapacitation due to abnormal movements: None, normal Patient's awareness of abnormal movements (rate only patient's report): No Awareness, Dental Status Current problems with teeth and/or dentures?: No Does patient usually wear dentures?: No  CIWA:    COWS:     Musculoskeletal: Strength & Muscle Tone: within normal limits Gait & Station: normal Patient leans: N/A  Psychiatric Specialty Exam: Physical Exam  Nursing note and vitals reviewed. Constitutional: Patricia [name] is oriented to person, place, and time.  Musculoskeletal: Normal range of motion.  Neurological: Patricia [name] is oriented to person, place, and time.    Review of Systems  Psychiatric/Behavioral: Positive for depression and substance abuse.  All other systems reviewed and are negative.   Blood pressure (!) 101/59, pulse 72, temperature 98 F (36.7 C), temperature source Oral, resp. rate 18, height _0  (1.651 m), weight 67.1 kg (148 lb), SpO2 100  %.Body mass index is 24.63 kg/m.  General Appearance: Disheveled  Eye Contact:  Poor  Speech:  Slow  Volume:  Decreased  Mood:  Dysphoric  Affect:  Blunt  Thought Process:  Disorganized  Orientation:  Full (Time, Place, and Person)  Thought Content:  NA  Suicidal Thoughts:  No  Homicidal Thoughts:  No  Memory:  Immediate;   NA Recent;   NA Remote;   NA  Judgement:  Poor  Insight:  Shallow  Psychomotor Activity:  Decreased  Concentration:  Concentration: NA and Attention Span: NA  Recall:  NA  Fund of Knowledge:  NA  Language:  Good  Akathisia:  No  Handed:    AIMS (if indicated):     Assets:  Physical Health  ADL's:  Intact  Cognition:  WNL  Sleep:  Number of Hours: 5.45     Treatment Plan Summary: Daily contact with patient to assess and evaluate symptoms and progress in treatment and Medication management  Patricia [name] is a 55 year old female with history of depression and mood lability admitted after suicide attempt by opiate overdose.   1. Suicidal ideation. The patient is able to contract for safety in the hospital.   2. Major depressive disorder. We continued Lamictal and increased Seroquel to 300 mg for depression and mood stabilization.  We discontinued Prozac and started Effexor.   3. Benzodiazepine use. The patient has been prescribed alprazolam 1 mg 3 times a day by her PCP. Patricia [name] completed librium taper.   4. Opiate, benzodiazepine, amphetamine use disorder, and alcohol use disorder in remission. This patient is in need of outpatient or inpatient substance abuse treatment   5. Dyslipidemia. We continued Lipitor 20 mg a day  6. Self-inflicted injury: Patient cut her left wrist and had signs of cellulitis. This is improving with clindamycin.   7. Metabolic syndrome monitoring. Lipid profile, hemoglobin A1c, and TSH are normal. Prolactin 127.    8. EKG. Normal sinus rhythm. QT 390.  9. Restless legs. Patricia [name] reports good response to Doxepine 100 mg.   10.  Disposition. Patricia [name] will be discharged with her husband. Patricia [name] will follow up with [facility].  Orson Slick, MD 10/29/2015, 1:07 PM

## 2015-10-29 NOTE — [facility] Group Notes (Signed)
[facility] LCSW Group Therapy Note  Type of Therapy and Topic:  Group Therapy:  Goals Group: SMART Goals  Participation Level:  Pt did not attend group. CSW invited pt to group.   Description of Group:   The purpose of a daily goals group is to assist and guide patients in setting recovery/wellness-related goals.  The objective is to set goals as they relate to the crisis in which they were admitted. Patients will be using SMART goal modalities to set measurable goals.  Characteristics of realistic goals will be discussed and patients will be assisted in setting and processing how one will reach their goal. Facilitator will also assist patients in applying interventions and coping skills learned in psycho-education groups to the SMART goal and process how one will achieve defined goal.  Therapeutic Goals: -Patients will develop and document one goal related to or their crisis in which brought them into treatment. -Patients will be guided by LCSW using SMART goal setting modality in how to set a measurable, attainable, realistic and time sensitive goal.  -Patients will process barriers in reaching goal. -Patients will process interventions in how to overcome and successful in reaching goal.   Summary of Patient Progress:  Patient Goal: Pt did not attend group. CSW invited pt to group.    Therapeutic Modalities:   Motivational Interviewing Engineer, manufacturing systemsCognitive Behavioral Therapy Crisis Intervention Model SMART goals setting  Adin Lariccia G. Garnette CzechSampson MSW, [facility]-PortlandCSWA 10/29/2015 12:06 PM

## 2015-10-30 NOTE — Progress Notes (Signed)
Patient discharged at 1255pm.  Patient denies SI/AVH/HI at this time.  Patient appeared to be in no acute distress at this time.  Patient voiced no concerns or complaints at this time.  Patient discharged and left with her husband.  Patient discharge instructions explained, discharge appointments noted and explained and patient verbalized understanding.  Patient belonging return and patient acknowledge receipt of those belongings.

## 2015-10-30 NOTE — [facility] Group Notes (Signed)
[facility] LCSW Group Therapy  10/30/2015 11:49 AM  Type of Therapy:  Group Therapy  Participation Level:  Patient did not attend group. CSW invited patient to group.   Summary of Progress/Problems:Feelings around Relapse. Group members discussed the meaning of relapse and shared personal stories of relapse, how it affected them and others, and how they perceived themselves during this time. Group members were encouraged to identify triggers, warning signs and coping skills used when facing the possibility of relapse. Social supports were discussed and explored in detail. Patients also discussed facing disappointment and how that can trigger someone to relapse.   Adonai Selsor G. Garnette CzechSampson MSW, LCSWA 10/30/2015, 11:49 AM

## 2015-10-30 NOTE — BHH Suicide Risk Assessment (Signed)
[facility]   Principal Problem: Bipolar 2 disorder, major depressive episode [facility](HCC) Discharge Diagnoses:  Patient Active Problem List   Diagnosis Date Noted  . Bipolar 2 disorder, major depressive episode (HCC) [F31.81] 09/28/2015    Priority: High  . Opiate overdose [T40.601A] 09/28/2015  . 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 Opioid use disorder, moderate, dependence (HCC) [F11.20] 09/28/2015  . 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 H/O eating disorder [Z86.59] 09/19/2013    Total Time spent with patient: 30 minutes  Musculoskeletal: Strength & Muscle Tone: within normal limits Gait & Station: normal Patient leans: N/A  Psychiatric Specialty Exam: Review of Systems  All other systems reviewed and are negative.   Blood pressure 108/64, pulse 82, temperature 98 F (36.7 C), temperature source Oral, resp. rate 18, height 5\' 5"  (1.651 m), weight 67.1 kg (148 lb), SpO2 100 %.Body mass index is 24.63 kg/m.  General Appearance: Casual  Eye Contact::  Good  Speech:  Clear and Coherent409  Volume:  Normal  Mood:  Euthymic  Affect:  Flat  Thought Process:  Goal Directed  Orientation:  Full (Time, Place, and Person)  Thought Content:  WDL  Suicidal Thoughts:  No  Homicidal Thoughts:  No  Memory:  Immediate;   Fair Recent;   Fair Remote;   Fair  Judgement:  Impaired  Insight:  Shallow  Psychomotor Activity:  Normal  Concentration:  Fair  Recall:  Fis[employer]Fair  Fund of Knowledge:Fair  Language: Fair  Akathisia:  No  Handed:  Right  AIMS (if indicated):     Assets:  Communication Skills Desire for Improvement Financial Resources/Insurance Housing Intimacy Physical Health Resilience Social Support  Sleep:  Number of Hours: 5.25  Cognition: WNL  ADL's:  Intact   Mental Status Per Nursing Assessment::   On Admission:  Self-harm behaviors  Demographic Factors:  Unemployed  Loss  Factors: NA  Historical Factors: Prior suicide attempts, Family history of suicide and Family history of mental illness or substance abuse  Risk Reduction Factors:   Sense of responsibility to family, Living with another person, especially a relative, Positive social support and Positive therapeutic relationship  Continued Clinical Symptoms:  Bipolar Disorder:   Depressive phase Depression:   Comorbid alcohol abuse/dependence Impulsivity Alcohol/Substance Abuse/Dependencies  Cognitive Features That Contribute To Risk:  None    Suicide Risk:  Minimal: No identifiable suicidal ideation.  Patients presenting with no risk factors but with morbid ruminations; may be classified as minimal risk based on the severity of the depressive symptoms  Follow-up Information    [facility] .   Why:  Please arrive to the walk-in clinic on Monday, Wednesday or Friday between the hours of 8am-3pm for your hospital follow up and you have an appt on Fri.October 13th at 3pm to meet with Dr. Suzie PortelaMoffitt for medication management and therapy.  Contact information: [address] [place] [address] [phone]           Plan Of Care/Follow-up recommendations:  Activity:  as tolerated. Diet:  low sodium heart healthy. Other:  keep follow up appointments.  Kristine LineaJolanta Dequincy Born, MD 10/30/2015, 7:58 AM

## 2015-10-30 NOTE — Progress Notes (Signed)
D: Observed pt in dayroom interacting with peers. Patient alert and oriented x4. Patient denies SI/HI/AVH. Pt affect is appropriate to circumstance. Pt stated her day was "pretty good." Pt rated anxiety 7/10 and depression 4/10. Pt mentioned anxiety was related to her being discharged stating "I'm nervous, but I'll work with it."  A: Offered active listening and support. Provided therapeutic communication. Administered scheduled medications.   R: Pt pleasant and cooperative. Pt medication compliant. Will continue Q15 min. checks. Safety maintained.

## 2015-10-30 NOTE — BHH Suicide Risk Assessment (Signed)
BHH INPATIENT:  Family/Significant Other Suicide Prevention Education  Suicide Prevention Education:  Patient Refusal for Family/Significant Other Suicide Prevention Education: The patient Patricia [name] has refused to provide written consent for family/significant other to be provided Family/Significant Other Suicide Prevention Education during admission and/or prior to discharge.  Physician notified.  CSW completed with pt.  Dorothe PeaJonathan F Lotoya Casella 10/30/2015, 1:22 PM

## 2015-10-30 NOTE — Plan of Care (Signed)
Problem: Activity: Goal: Interest or engagement in activities will improve Outcome: Progressing Pt attending group and interacting well with peers.

## 2015-10-30 NOTE — Progress Notes (Signed)
  [facility] :  Will you be returning to the same living situation after discharge:  Yes,  [place] to live with her husband  At discharge, do you have transportation home?: Yes,  pt willl be picked up by her husband Do you have the ability to pay for your medications: Yes,  pt will be provided with prescriptions upon discharge  Release of information consent forms completed and in the chart;  Patient's signature needed at discharge.  Patient to Follow up at: Follow-up Information    [facility]   Why:  Please arrive to the walk-in clinic on Monday, Wednesday or Friday between the hours of 8am-3pm for hospital follow up or your appt on Fri.October 13th at 3pm to meet with Dr. Suzie PortelaMoffitt for medication management, substance abuse counseling and therapy.  Contact information: [address] [place] [address] [phone]           Next level of care provider has access to [facility] Link:no  Safety Planning and Suicide Prevention discussed: Yes,  completed with pt  Have you used any form of tobacco in the last 30 days? (Cigarettes, Smokeless Tobacco, Cigars, and/or Pipes): No  Has patient been referred to the Quitline?: Patient refused referral  Patient has been referred for addiction treatment: Yes  Dorothe PeaJonathan F Lliam Hoh 10/30/2015, 1:09 PM

## 2015-10-30 NOTE — Progress Notes (Signed)
Post-Fall Note D:  Writer heard noise in pt's room, and writer went to check on pt. Writer observed pt on the floor sitting down. Pt stated "I was trying to get my water." Pt indicated she fell immediately after standing from the bed attempting to walk to the table and get her cup of water. Pt was alert and oriented to person, place, time and situation. Pt indicated she might have bumped her head against the wall when she fell. No bruising or swelling on head. Pt said head did not hurt. Pt had minor abrasion to left knee and left elbow that were painful when abrasion was directly touched. Pt able to bear weight without pain. Pt has full range of motion and strength in extremities. Neuro assessment within normal limits. No pain when moving extremities.  A: Performed vital sign and neuro assessment. Cleansed abrasion and placed bandages. Called MD and nursing supervisor. Offered pain medication. Re-educated pt on fall safety. Provided new yellow socks to pt. Placed bed alarm on pt. Advised pt not to ambulate without assistance from staff. Pt currently has room near nursing station R: Pt stated "I feel fine." Pt remains alert and oriented. Pt has no complaints of headache or head pain. Pt refused pain medication for elbow or knee stating "I'll be fine." Pt fell back asleep shortly afer. 15 min rounding maintained. Will continue to monitor pt.

## 2015-10-30 NOTE — BHH Group Notes (Signed)
BHH Group Notes:  (Nursing/MHT/Case Management/Adjunct)  Date:  10/30/2015  Time:  1:52 AM  Type of Therapy:  Group Therapy  Participation Level:  Active  Participation Quality:  Appropriate  Affect:  Appropriate  Cognitive:  Appropriate  Insight:  Appropriate  Engagement in Group:  Engaged  Modes of Intervention:  Discussion  Summary of Progress/Problems: Pt stated that she had reached her goal of accepting that she would be going home soon. Pt stated that she is a lot better and is more prepared to go home than she was previously.   Patricia [name] 10/30/2015, 1:52 AM

## 2015-10-30 NOTE — Discharge Summary (Signed)
Physician Discharge Summary Note  Patient:  Patricia [name] is an 55 y.o., female MRN:  [id number] DOB:  [birth year] Patient phone:  [phone] (home)  Patient address:   [address] [place] [address],  Total Time spent with patient: 30 minutes  Date of Admission:  10/24/2015 Date of Discharge: 10/30/2015  Reason for Admission:  Overdose.  Patricia [name] a 55 y.o.femalebrought to the ED on 8/25 from home via EMS status post suicide attempts. EMS reports patient was arguing with a family member.. She was upset and subsequently cut her left wrist. Allegedly her husband watched as she took some pills and cut herself approximately one hour prior to arrival. It is unknown what she took. Patient stated she took he husbands "pain pills". Review of records show that patient recently hadan intentional overdose on pain pills and Ambien.   I attempted to interview the patient but she fell asleep at least 3 times during the assessment. Most of the information obtained to complaint this admissionfrom records.  Patient has been denying suicidality, homicidality or having auditory or visual hallucinations. She did stated that her intention was to kill herself when she took the overdose.  She has also had several admissionsto behavioral medicine due to depression and multiple attempts to overdose on medications. Patient was just discharged from our unit on 8/2  Patient was receiving outpatient treatment with [facility] but was discharged from their services. She's currently with [facility]. The psychiatrist she was seeing resigned and now she only sees a therapist.  Substance abuse history: Patient has been abusing amphetamines, benzodiazepines and opiates. Neither the opiates are amphetamines are prescribed to her. Patient has a history of alcoholism but per records she has been sober for about a year.  There is history of multiple inpatient  treatment for substance abuse.  Per records from prior admission "Apparently the patient has been regularly using her husband's prescribed pain medication. She was also positive for stimulants on admission. [facility]". "She admits that she shares narcotic painkillers that are prescribed for her husband and that she took stimulant that have not been prescribed for her. She reports that she no longer takes benzodiazepines she has not been drinking alcohol for years."  Associated Signs/Symptoms: Depression Symptoms:  depressed mood, suicidal attempt, (Hypo) Manic Symptoms:  Impulsivity, Anxiety Symptoms:  Excessive Worry, Psychotic Symptoms:  denies PTSD Symptoms: NA  Past Psychiatric History: She is has been struggling with depression since her teenage years. She had several psychiatric hospital overdose and cutting when younger. One of the overdose was severe enough to require respirator. She has narrowing of her trachea from it. She has been tried on multiple antidepressants. She believes that the combination of Lamictal and high-dose Prozac works well for her. She has never been on antipsychotic. There is a history of alcoholism but she has been able to maintain sobriety for several years now.  Family Psychiatric  History: She denies any.  Social History: She lives with her husband, her son and his girlfriend. 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   Principal Problem: Bipolar 2 disorder, major depressive episode [facility]) Discharge Diagnoses: Patient Active Problem List   Diagnosis Date Noted  . Bipolar 2 disorder, major depressive episode (HCC) [F31.81] 09/28/2015    Priority: High  . Opiate overdose [T40.601A] 09/28/2015  . 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 Opioid use disorder, moderate,  dependence (HCC) [F11.20] 09/28/2015  . Tracheal stenosis following tracheostomy  (HCC) [J95.09] 01/14/2014  . H/O eating disorder [Z86.59] 09/19/2013    Past Medical History:  Past Medical History:  Diagnosis Date  . Alcohol abuse unk  . Anxiety unk  . Aspiration into airway   . Depression unk    Past Surgical History:  Procedure Laterality Date  . ABDOMINAL HYSTERECTOMY    . FOOT SURGERY    . TRACHEAL SURGERY     Family History:  Family History  Problem Relation Age of Onset  . Breast cancer Mother   . Hypertension Mother   . Depression Mother   . Anxiety disorder Mother   . Alcohol abuse Father   . Alcohol abuse Sister   . Anxiety disorder Sister   . Bipolar disorder Sister     Social History:  History  Alcohol Use No    Comment: former alcoholic "sober for a month now"     History  Drug Use No    Social History   Social History  . Marital status: Married    Spouse name: N/A  . Number of children: N/A  . Years of education: N/A   Social History Main Topics  . Smoking status: Former Smoker    Types: Cigarettes  . Smokeless tobacco: Never Used  . Alcohol use No     Comment: former alcoholic "sober for a month now"  . Drug use: No  . Sexual activity: Not Currently   Other Topics Concern  . None   Social History Narrative  . None    Hospital Course:    Patricia [name] is a 55 year old female with history of depression, mood lability, and substance abuse admitted after suicide attempt by opiate overdose.   1. Suicidal ideation. This has resolved. The patient is able to contract for safety. She is forward thinking and optimistic about the future.    2. Mood. We continued Lamictal and increased Seroquel to 300 mg for depression and mood stabilization.  We discontinued Prozac and started Effexor.  3. Benzodiazepine use. She completed librium taper.   4. Opiate, benzodiazepine, amphetamine use disorder, and alcohol use disorder in remission. This patient declined residential treatment.   5. Dyslipidemia. We continued Lipitor 20  mg a day  6. Self-inflicted injury: Patient cut her left wrist and had signs of cellulitis. This is improving with clindamycin.   7. Metabolic syndrome monitoring. Lipid profile, hemoglobin A1c, and TSH are normal. Prolactin 127.    8. EKG. Normal sinus rhythm. QT 390.  9. Restless legs. She reports good response to Doxepine 100 mg.   10. Disposition. She was discharged with her husband. She will follow up with [facility].   Physical Findings: AIMS: Facial and Oral Movements Muscles of Facial Expression: None, normal Lips and Perioral Area: None, normal Jaw: None, normal Tongue: None, normal,Extremity Movements Upper (arms, wrists, hands, fingers): None, normal Lower (legs, knees, ankles, toes): None, normal, Trunk Movements Neck, shoulders, hips: None, normal, Overall Severity Severity of abnormal movements (highest score from questions above): None, normal Incapacitation due to abnormal movements: None, normal Patient's awareness of abnormal movements (rate only patient's report): No Awareness, Dental Status Current problems with teeth and/or dentures?: No Does patient usually wear dentures?: No  CIWA:    COWS:     Musculoskeletal: Strength & Muscle Tone: within normal limits Gait & Station: normal Patient leans: N/A  Psychiatric Specialty Exam: Physical Exam  Nursing note and vitals reviewed.   Review of Systems  All other systems reviewed and are negative.   Blood pressure 108/64, pulse 82, temperature 98 F (36.7 C), temperature source Oral, resp. rate 18, height 5\' 5"  (1.651 m), weight 67.1 kg (148 lb), SpO2 100 %.Body mass index is 24.63 kg/m.  See SRA.                                                  Sleep:  Number of Hours: 5.25     Have you used any form of tobacco in the last 30 days? (Cigarettes, Smokeless Tobacco, Cigars, and/or Pipes): No  Has this patient used any form of tobacco in the last 30 days? (Cigarettes, Smokeless  Tobacco, Cigars, and/or Pipes) Yes, Yes, A prescription for an FDA-approved tobacco cessation medication was offered at discharge and the patient refused  Blood Alcohol level:  Lab Results  Component Value Date   [facility] <5 10/23/2015   ETH <5 09/27/2015    Metabolic Disorder Labs:  Lab Results  Component Value Date   HGBA1C 4.9 10/23/2015   MPG 103 08/17/2013   Lab Results  Component Value Date   PROLACTIN 127.2 (H) 10/23/2015   PROLACTIN 78.6 (H) 09/30/2015   Lab Results  Component Value Date   CHOL 198 10/23/2015   TRIG 72 10/23/2015   HDL 85 10/23/2015   CHOLHDL 2.3 10/23/2015   VLDL 14 10/23/2015   LDLCALC 99 10/23/2015   LDLCALC 73 09/30/2015    See Psychiatric Specialty Exam and Suicide Risk Assessment completed by Attending Physician prior to discharge.  Discharge destination:  Home  Is patient on multiple antipsychotic therapies at discharge:  No   Has Patient had three or more failed trials of antipsychotic monotherapy by history:  No  Recommended Plan for Multiple Antipsychotic Therapies: NA  Discharge Instructions    Diet - low sodium heart healthy    Complete by:  As directed   Increase activity slowly    Complete by:  As directed       Medication List    STOP taking these medications   ALPRAZolam 1 MG tablet Commonly known as:  XANAX   FLUoxetine 40 MG capsule Commonly known as:  PROZAC   folic acid 1 MG tablet Commonly known as:  FOLVITE     TAKE these medications     Indication  atorvastatin 20 MG tablet Commonly known as:  LIPITOR Take 20 mg by mouth at bedtime.  Indication:  High Amount of Triglycerides in the Blood   clindamycin 300 MG capsule Commonly known as:  CLEOCIN Take 1 capsule (300 mg total) by mouth every 6 (six) hours.  Indication:  Skin and Soft Tissue Infection   doxepin 100 MG capsule Commonly known as:  SINEQUAN Take 1 capsule (100 mg total) by mouth at bedtime.  Indication:  Depression   lamoTRIgine 200 MG  tablet Commonly known as:  LAMICTAL Take 1 tablet (200 mg total) by mouth daily.  Indication:  Manic-Depression   naloxone HCl 4 MG/0.1ML Liqd administer in case of opioid overdose.  Indication:  Opioid Overdose   QUEtiapine 300 MG tablet Commonly known as:  SEROQUEL Take 1 tablet (300 mg total) by mouth at bedtime. What changed:  medication strength  how much to take  Indication:  Depressive Phase of Manic-Depression   venlafaxine XR 150 MG 24 hr capsule Commonly known as:  EFFEXOR-XR Take 1 capsule (150 mg total) by mouth daily with breakfast.  Indication:  Major Depressive Disorder      Follow-up Information    [facility] .   Why:  Please arrive to the walk-in clinic on Monday, Wednesday or Friday between the hours of 8am-3pm for your hospital follow up and you have an appt on Fri.October 13th at 3pm to meet with Dr. Suzie Portela for medication management and therapy.  Contact information: [address] [place] [address] [phone]           Follow-up recommendations:  Activity:  as tolerated. Diet:  low sodium heart healthy. Other:  keep follow up appointment.  Comments:    Signed: Kristine Linea, MD 10/30/2015, 7:58 AM

## 2015-12-06 IMAGING — CR DG CHEST 1V PORT
1 series · 1 of 1 positions shown · non-contrast
Comparison: 05/23/2013

CLINICAL DATA: Unresponsive patient.

EXAM:
PORTABLE CHEST - 1 VIEW

[ap]
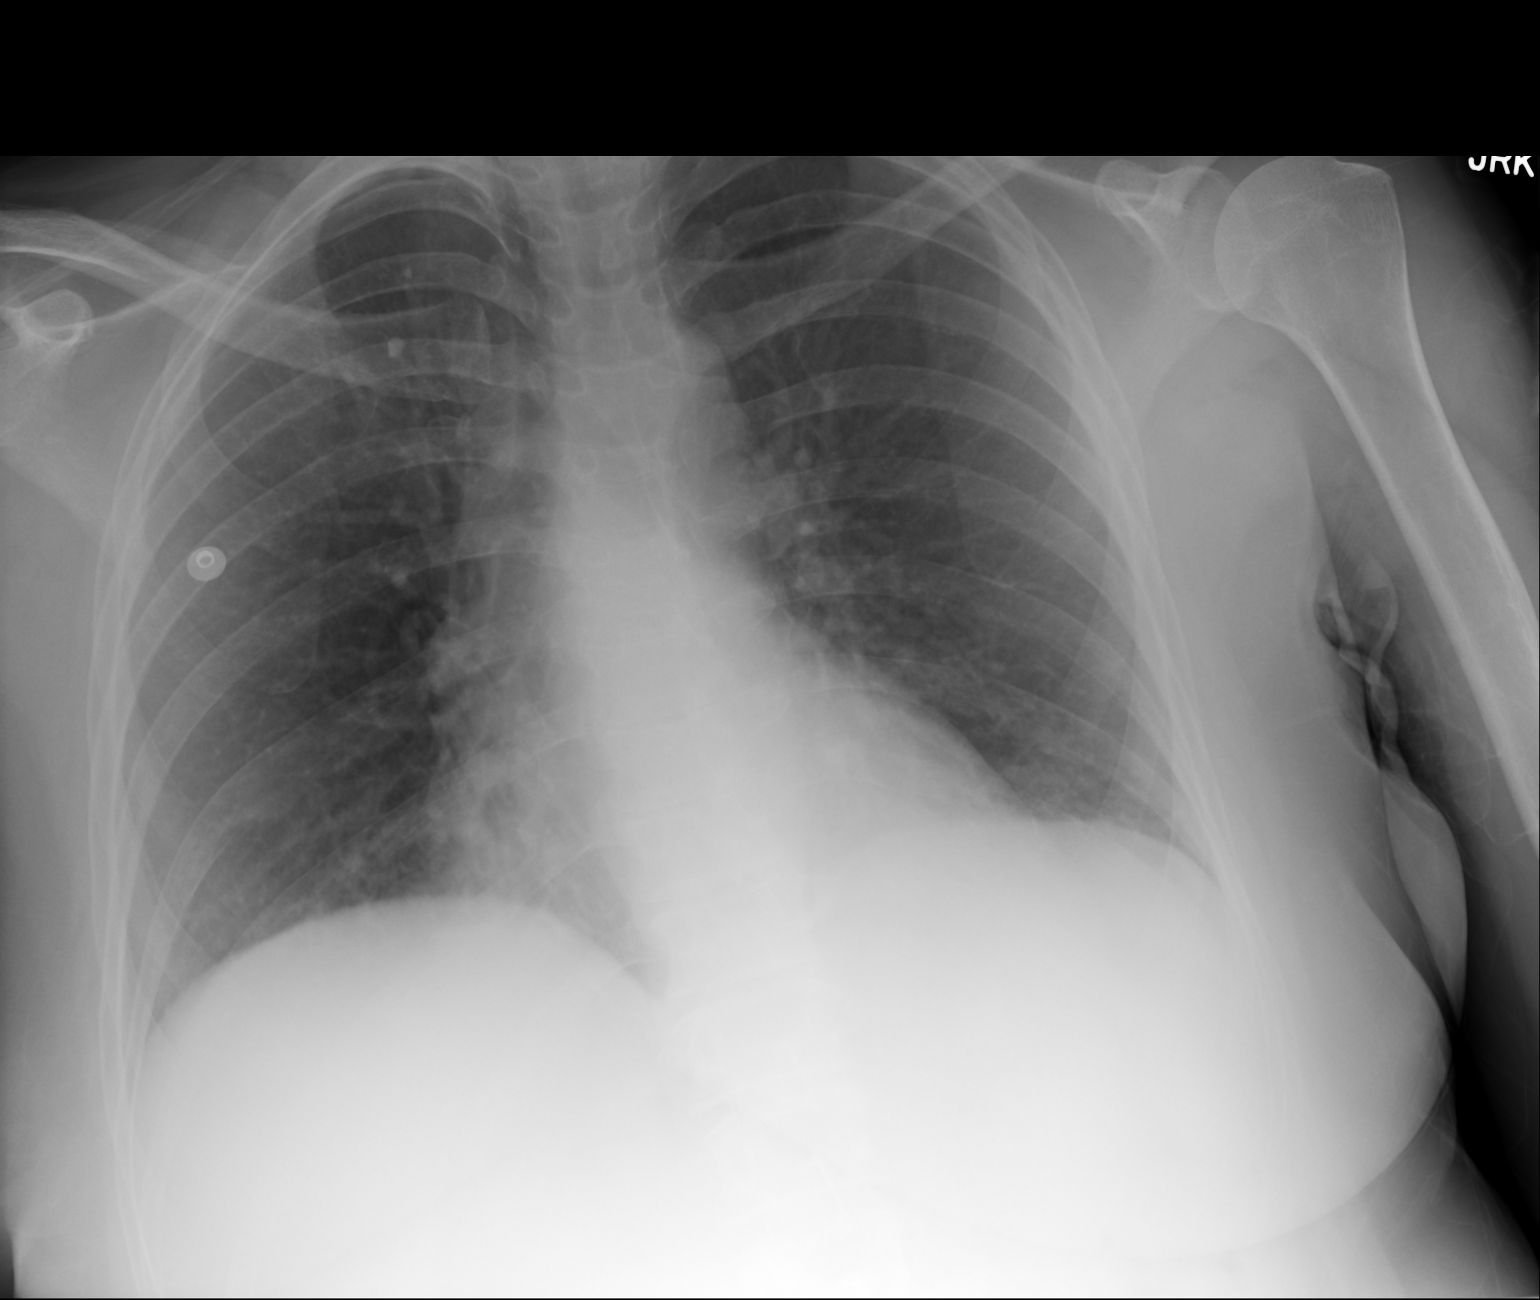

[1 of 1 positions shown; findings below may reference images not displayed]

FINDINGS: Heart, mediastinum and hila are unremarkable. Stable right upper
lobe granuloma. Lungs are otherwise clear. No pleural effusion. No
pneumothorax. Bony thorax is intact.
IMPRESSION: No active disease.

## 2015-12-07 IMAGING — CR DG CHEST 1V PORT
1 series · 1 of 1 positions shown · non-contrast
Comparison: 07/29/2013

CLINICAL DATA: Intubation.

EXAM:
PORTABLE CHEST - 1 VIEW

[ap]
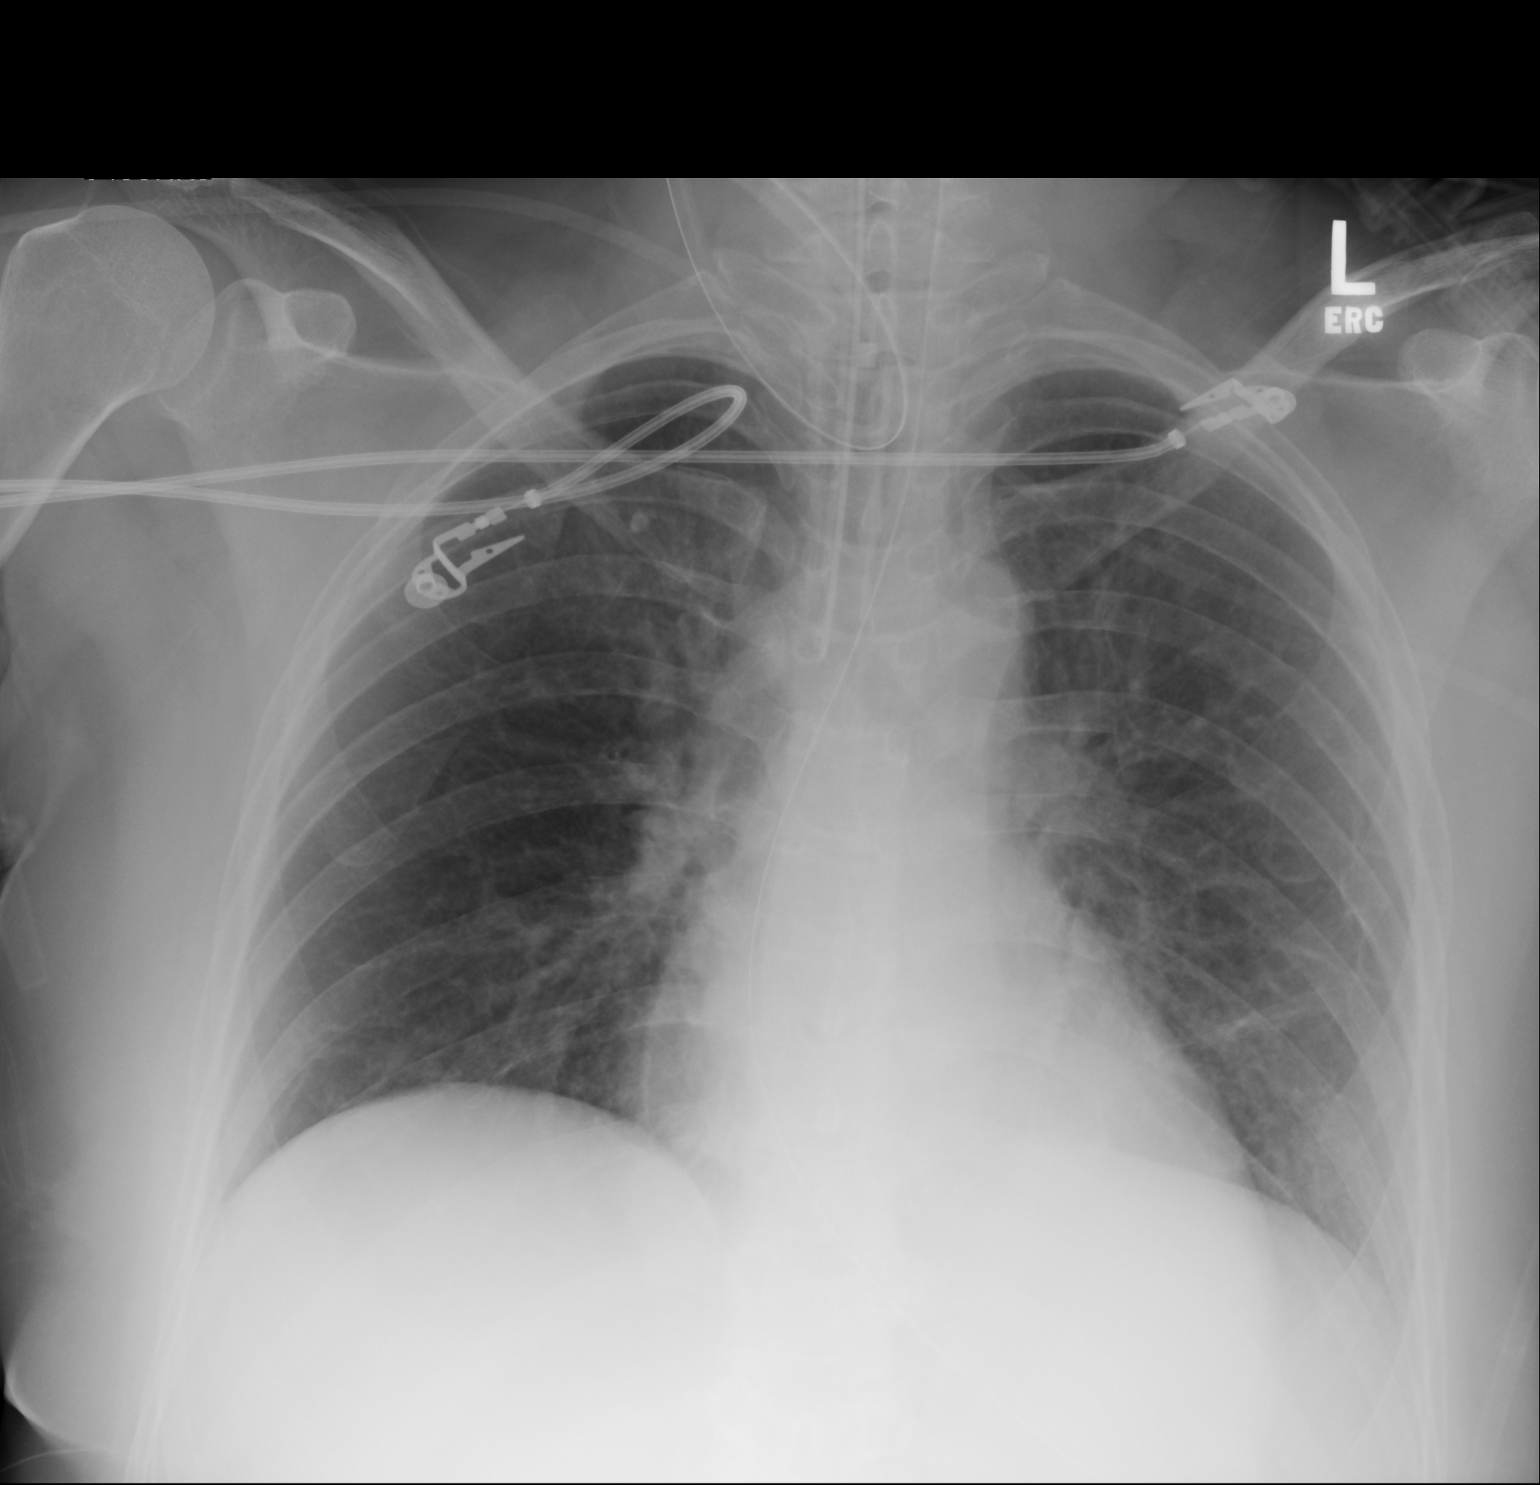

[1 of 1 positions shown; findings below may reference images not displayed]

FINDINGS: Endotracheal tube ends at the carina. The orogastric tube enters the
stomach. These results were called by telephone at the time of
interpretation on 07/30/2013 at [DATE] to [REDACTED], who verbally
acknowledged these results.

The lungs remain relatively well aerated. No cardiomegaly when
accounting for technique. No effusion or pneumothorax.
IMPRESSION: 1. Endotracheal tube at the carina.  Suggest retraction by 2 cm.
2. Orogastric tube in good position.
3. Lungs remain essentially clear.

## 2015-12-09 IMAGING — CR DG CHEST 1V PORT
1 series · 1 of 1 positions shown · non-contrast
Comparison: July 30, 2013.

CLINICAL DATA: Endotracheal tube placement.

EXAM:
PORTABLE CHEST - 1 VIEW

[ap]
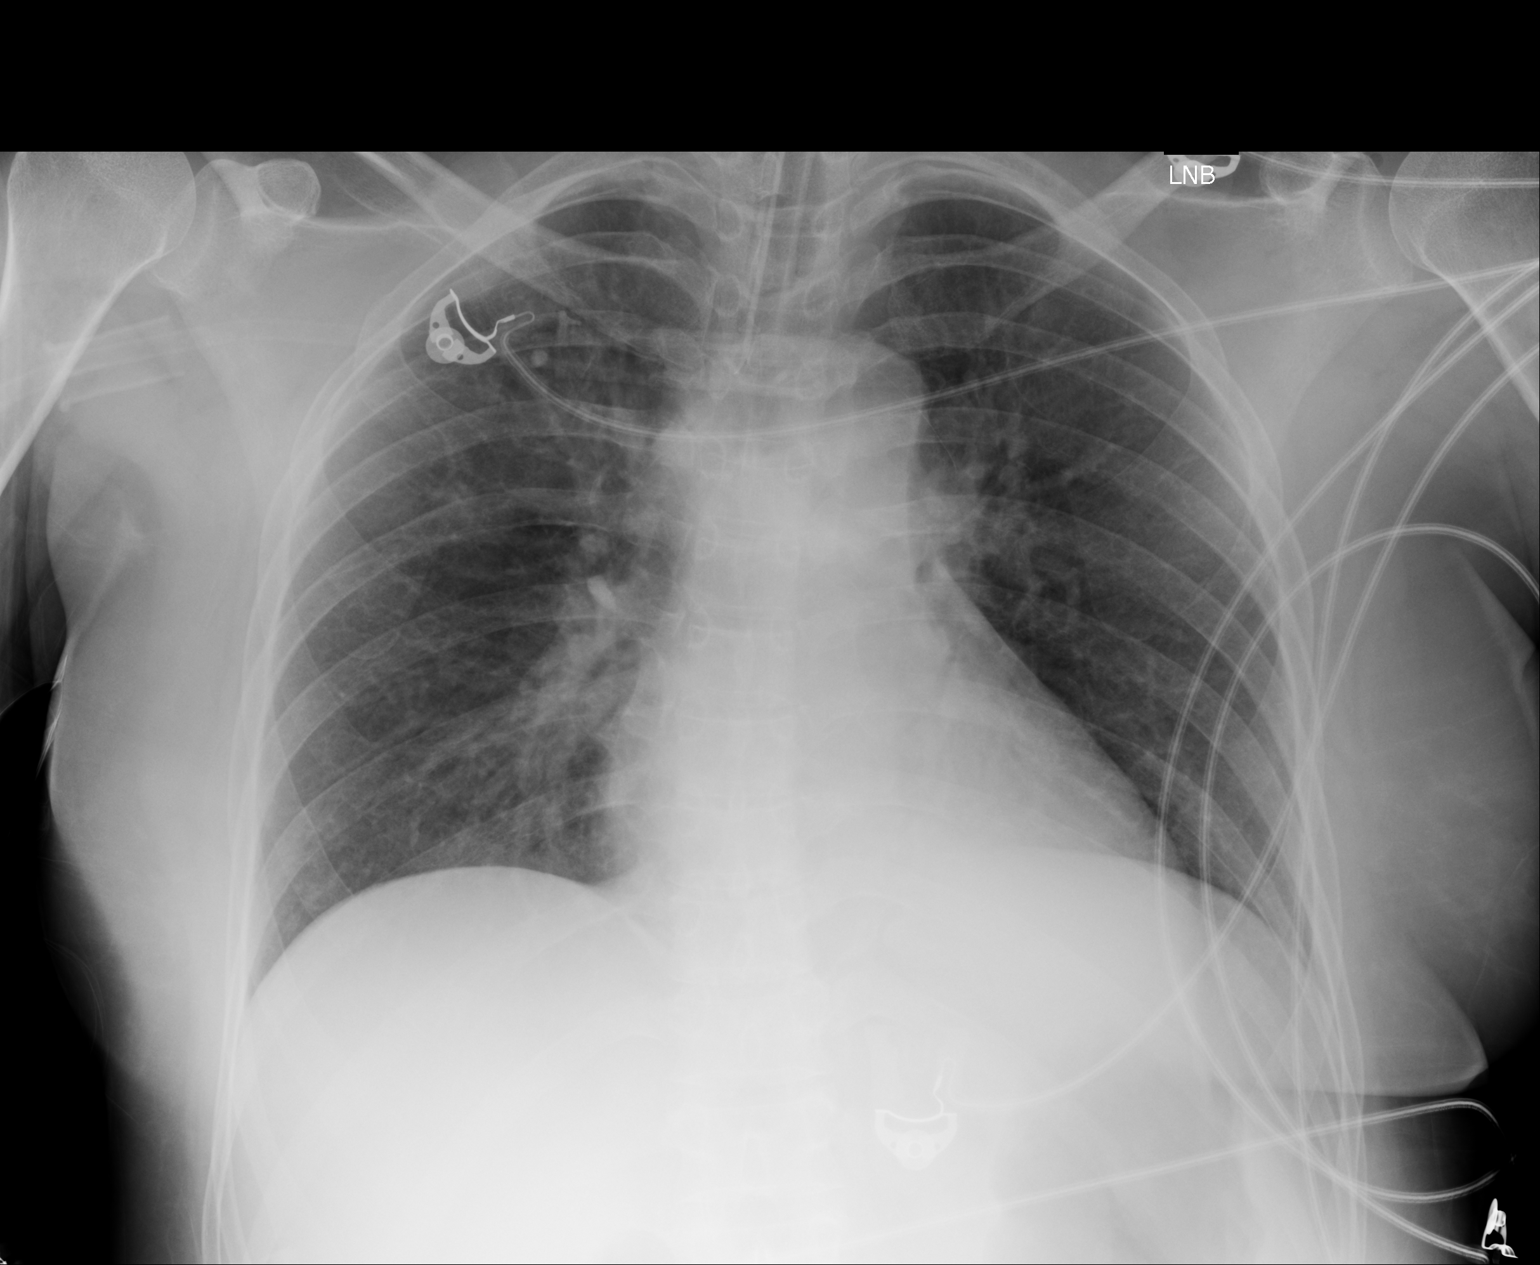

[1 of 1 positions shown; findings below may reference images not displayed]

FINDINGS: The heart size and mediastinal contours are within normal limits.
Endotracheal tube is in grossly good position with distal tip 2 cm
above the carina. No pneumothorax or pleural effusion is noted. Both
lungs are clear. The visualized skeletal structures are
unremarkable.
IMPRESSION: Endotracheal tube in grossly good position. No acute cardiopulmonary
abnormality seen.

## 2015-12-16 IMAGING — CR DG CHEST 1V PORT
1 series · 1 of 1 positions shown · non-contrast
Comparison: 08/05/2013

CLINICAL DATA: Ventilator.

EXAM:
PORTABLE CHEST - 1 VIEW

[ap]
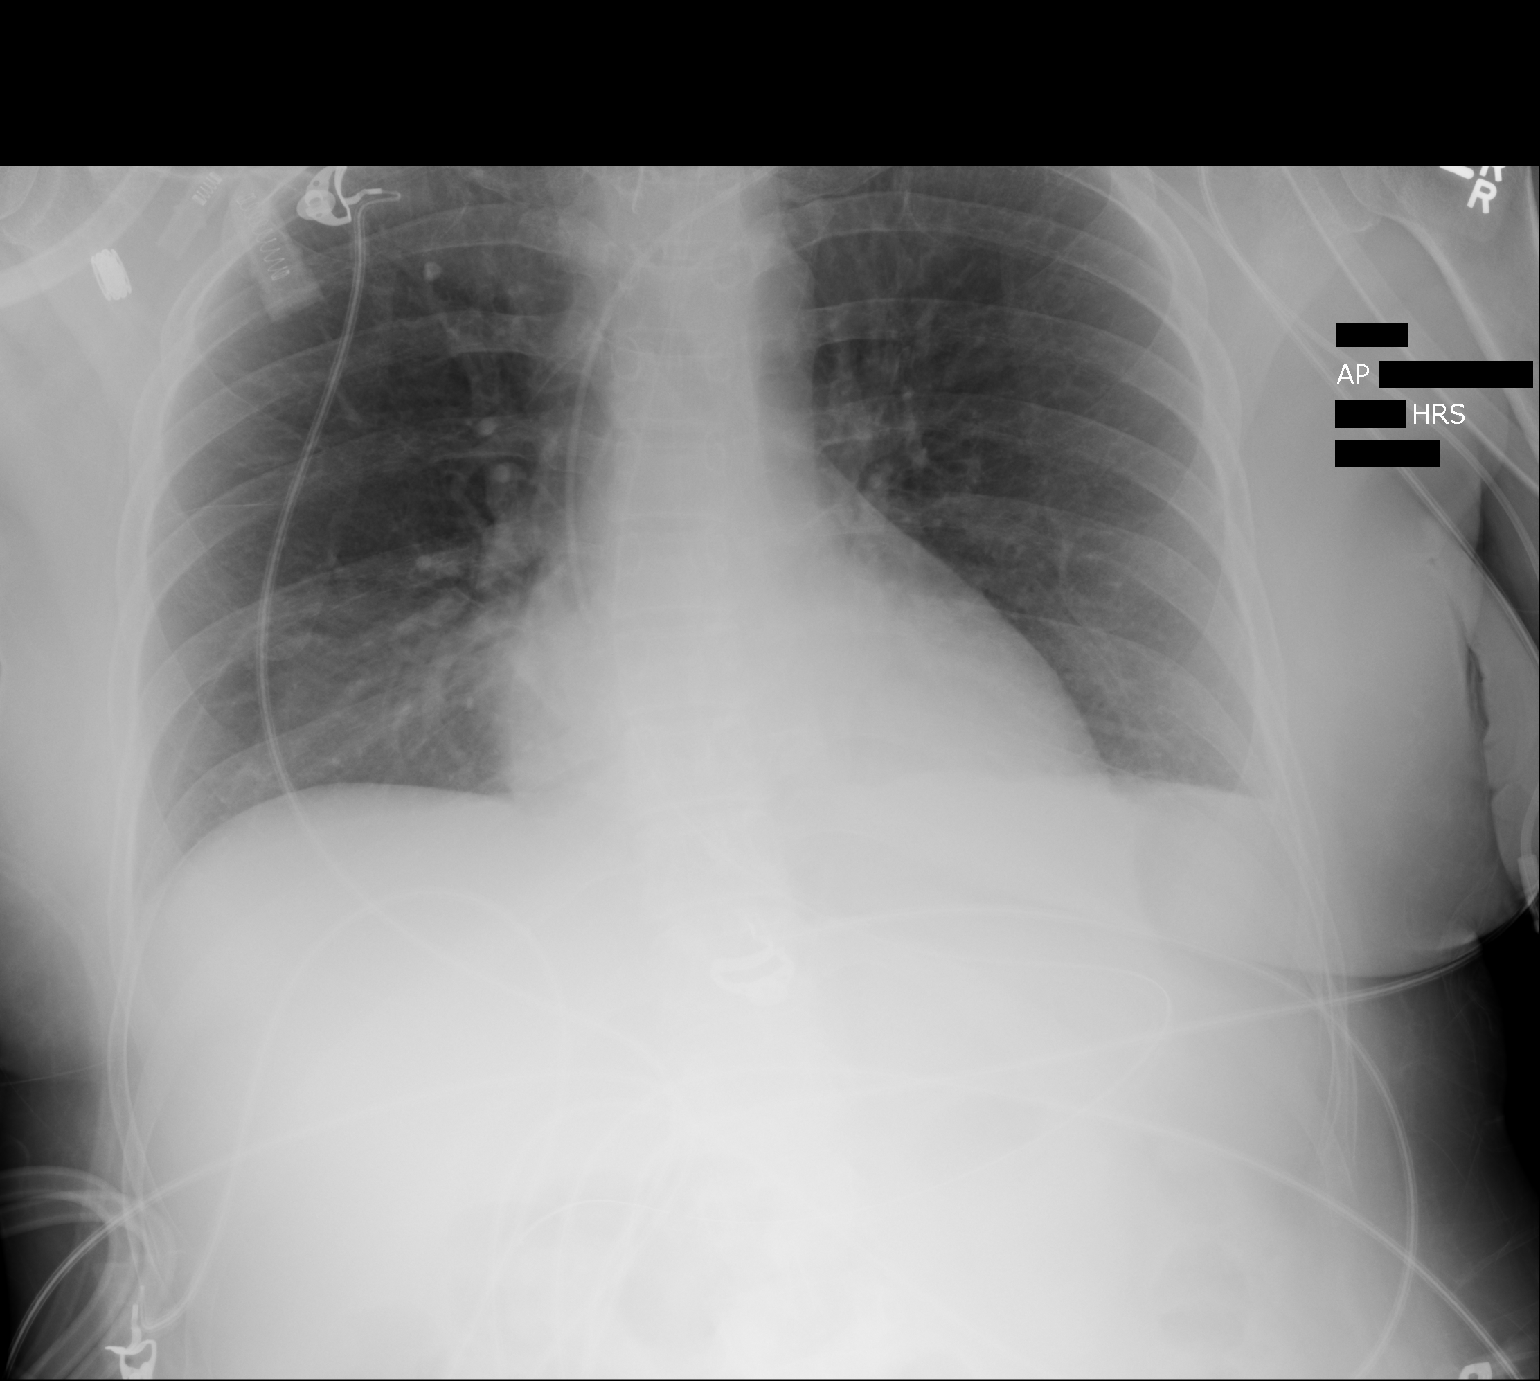

[1 of 1 positions shown; findings below may reference images not displayed]

FINDINGS: Nasogastric tube courses through the region of the stomach with tip
overlying the right upper quadrant likely within the distal stomach
or proximal duodenum. Endotracheal tube has tip 3.6 cm above the
carina. Left subclavian central venous catheter is unchanged. Lungs
are hypoinflated with interval improvement in the previously seen
left basilar opacification with only minimal residual left
retrocardiac density suggesting resolving atelectasis or infection.
Interval resolution of right base hazy airspace density.
Cardiomediastinal silhouette and remainder of the exam is unchanged.
IMPRESSION: Resolution of right base hazy airspace density. Moderate per
interval improvement and left base opacification with minimal
residual left retrocardiac density suggesting improving
atelectasis/effusion or infection.

Tubes and lines as described.

## 2015-12-18 ENCOUNTER — Encounter: Payer: Self-pay | Admitting: Emergency Medicine

## 2015-12-18 ENCOUNTER — Emergency Department: Payer: BLUE CROSS/BLUE SHIELD

## 2015-12-18 ENCOUNTER — Inpatient Hospital Stay
Admission: EM | Admit: 2015-12-18 | Discharge: 2015-12-21 | DRG: 917 | Disposition: A | Discharge status: Other Acute Inpatient Hospital | Payer: BLUE CROSS/BLUE SHIELD | Attending: Internal Medicine | Admitting: Internal Medicine

## 2015-12-18 ENCOUNTER — Inpatient Hospital Stay: Payer: BLUE CROSS/BLUE SHIELD

## 2015-12-18 ENCOUNTER — Inpatient Hospital Stay
Admission: RE | Admit: 2015-12-18 | Discharge: 2015-12-18 | DRG: 885 | Disposition: A | Discharge status: Other Acute Inpatient Hospital | Payer: BLUE CROSS/BLUE SHIELD | Source: Intra-hospital | Attending: Psychiatry | Admitting: Psychiatry

## 2015-12-18 ENCOUNTER — Emergency Department (EMERGENCY_DEPARTMENT_HOSPITAL)
Admission: EM | Admit: 2015-12-18 | Discharge: 2015-12-18 | Disposition: A | Discharge status: Other Acute Inpatient Hospital | Payer: BLUE CROSS/BLUE SHIELD | Source: Home / Self Care | Attending: Emergency Medicine | Admitting: Emergency Medicine

## 2015-12-18 DIAGNOSIS — R569 Unspecified convulsions: Secondary | ICD-10-CM

## 2015-12-18 DIAGNOSIS — E876 Hypokalemia: Secondary | ICD-10-CM | POA: Diagnosis present

## 2015-12-18 DIAGNOSIS — G40409 Other generalized epilepsy and epileptic syndromes, not intractable, without status epilepticus: Secondary | ICD-10-CM | POA: Diagnosis present

## 2015-12-18 DIAGNOSIS — F3181 Bipolar II disorder: Secondary | ICD-10-CM | POA: Diagnosis present

## 2015-12-18 DIAGNOSIS — J811 Chronic pulmonary edema: Secondary | ICD-10-CM | POA: Diagnosis present

## 2015-12-18 DIAGNOSIS — D649 Anemia, unspecified: Secondary | ICD-10-CM | POA: Diagnosis present

## 2015-12-18 DIAGNOSIS — Z818 Family history of other mental and behavioral disorders: Secondary | ICD-10-CM

## 2015-12-18 DIAGNOSIS — R45851 Suicidal ideations: Secondary | ICD-10-CM | POA: Diagnosis not present

## 2015-12-18 DIAGNOSIS — Z01818 Encounter for other preprocedural examination: Secondary | ICD-10-CM

## 2015-12-18 DIAGNOSIS — Z87891 Personal history of nicotine dependence: Secondary | ICD-10-CM | POA: Diagnosis not present

## 2015-12-18 DIAGNOSIS — I959 Hypotension, unspecified: Secondary | ICD-10-CM | POA: Diagnosis present

## 2015-12-18 DIAGNOSIS — R000 Tachycardia, unspecified: Secondary | ICD-10-CM | POA: Diagnosis present

## 2015-12-18 DIAGNOSIS — Z8249 Family history of ischemic heart disease and other diseases of the circulatory system: Secondary | ICD-10-CM

## 2015-12-18 DIAGNOSIS — Z811 Family history of alcohol abuse and dependence: Secondary | ICD-10-CM | POA: Diagnosis not present

## 2015-12-18 DIAGNOSIS — F1120 Opioid dependence, uncomplicated: Secondary | ICD-10-CM | POA: Diagnosis present

## 2015-12-18 DIAGNOSIS — R4182 Altered mental status, unspecified: Secondary | ICD-10-CM | POA: Diagnosis not present

## 2015-12-18 DIAGNOSIS — T40601A Poisoning by unspecified narcotics, accidental (unintentional), initial encounter: Secondary | ICD-10-CM | POA: Diagnosis present

## 2015-12-18 DIAGNOSIS — Z930 Tracheostomy status: Secondary | ICD-10-CM

## 2015-12-18 DIAGNOSIS — F32A Depression, unspecified: Secondary | ICD-10-CM

## 2015-12-18 DIAGNOSIS — J9601 Acute respiratory failure with hypoxia: Secondary | ICD-10-CM

## 2015-12-18 DIAGNOSIS — J9600 Acute respiratory failure, unspecified whether with hypoxia or hypercapnia: Secondary | ICD-10-CM

## 2015-12-18 DIAGNOSIS — Z79899 Other long term (current) drug therapy: Secondary | ICD-10-CM | POA: Insufficient documentation

## 2015-12-18 DIAGNOSIS — T402X2A Poisoning by other opioids, intentional self-harm, initial encounter: Secondary | ICD-10-CM | POA: Diagnosis present

## 2015-12-18 DIAGNOSIS — T40602A Poisoning by unspecified narcotics, intentional self-harm, initial encounter: Secondary | ICD-10-CM | POA: Diagnosis not present

## 2015-12-18 DIAGNOSIS — T426X2A Poisoning by other antiepileptic and sedative-hypnotic drugs, intentional self-harm, initial encounter: Principal | ICD-10-CM | POA: Diagnosis present

## 2015-12-18 DIAGNOSIS — Z803 Family history of malignant neoplasm of breast: Secondary | ICD-10-CM

## 2015-12-18 DIAGNOSIS — Z4659 Encounter for fitting and adjustment of other gastrointestinal appliance and device: Secondary | ICD-10-CM

## 2015-12-18 DIAGNOSIS — F329 Major depressive disorder, single episode, unspecified: Secondary | ICD-10-CM

## 2015-12-18 DIAGNOSIS — T41292A Poisoning by other general anesthetics, intentional self-harm, initial encounter: Secondary | ICD-10-CM | POA: Insufficient documentation

## 2015-12-18 DIAGNOSIS — F331 Major depressive disorder, recurrent, moderate: Secondary | ICD-10-CM | POA: Diagnosis not present

## 2015-12-18 DIAGNOSIS — T50902A Poisoning by unspecified drugs, medicaments and biological substances, intentional self-harm, initial encounter: Secondary | ICD-10-CM

## 2015-12-18 HISTORY — DX: Unspecified convulsions: R56.9

## 2015-12-18 LAB — CBC
HCT: 37.5 % (ref 35.0–47.0)
Hemoglobin: 12.9 g/dL (ref 12.0–16.0)
MCH: 31.9 pg (ref 26.0–34.0)
MCHC: 34.5 g/dL (ref 32.0–36.0)
MCV: 92.5 fL (ref 80.0–100.0)
PLATELETS: 235 10*3/uL (ref 150–440)
RBC: 4.06 MIL/uL (ref 3.80–5.20)
RDW: 14.2 % (ref 11.5–14.5)
WBC: 9.6 10*3/uL (ref 3.6–11.0)

## 2015-12-18 LAB — BLOOD GAS, ARTERIAL
ACID-BASE DEFICIT: 1.2 mmol/L (ref 0.0–2.0)
Bicarbonate: 24.3 mmol/L (ref 20.0–28.0)
FIO2: 1
MECHANICAL RATE: 18
MECHVT: 500 mL
O2 Saturation: 100 %
PEEP/CPAP: 5 cmH2O
PH ART: 7.36 (ref 7.350–7.450)
Patient temperature: 37
pCO2 arterial: 43 mmHg (ref 32.0–48.0)
pO2, Arterial: 524 mmHg — ABNORMAL HIGH (ref 83.0–108.0)

## 2015-12-18 LAB — COMPREHENSIVE METABOLIC PANEL
ALT: 34 U/L (ref 14–54)
AST: 86 U/L — AB (ref 15–41)
Albumin: 4.3 g/dL (ref 3.5–5.0)
Alkaline Phosphatase: 105 U/L (ref 38–126)
Anion gap: 12 (ref 5–15)
BILIRUBIN TOTAL: 0.6 mg/dL (ref 0.3–1.2)
BUN: 15 mg/dL (ref 6–20)
CO2: 26 mmol/L (ref 22–32)
CREATININE: 0.94 mg/dL (ref 0.44–1.00)
Calcium: 9.5 mg/dL (ref 8.9–10.3)
Chloride: 100 mmol/L — ABNORMAL LOW (ref 101–111)
Glucose, Bld: 114 mg/dL — ABNORMAL HIGH (ref 65–99)
POTASSIUM: 4 mmol/L (ref 3.5–5.1)
Sodium: 138 mmol/L (ref 135–145)
TOTAL PROTEIN: 7.7 g/dL (ref 6.5–8.1)

## 2015-12-18 LAB — TROPONIN I: TROPONIN I: 0.05 ng/mL — AB (ref <0.03)

## 2015-12-18 LAB — ETHANOL

## 2015-12-18 LAB — AMYLASE: Amylase: 125 U/L — ABNORMAL HIGH (ref 28–100)

## 2015-12-18 LAB — GLUCOSE, CAPILLARY
GLUCOSE-CAPILLARY: 116 mg/dL — AB (ref 65–99)
GLUCOSE-CAPILLARY: 113 mg/dL — AB (ref 65–99)

## 2015-12-18 LAB — LIPASE, BLOOD: LIPASE: 19 U/L (ref 11–51)

## 2015-12-18 LAB — PROCALCITONIN

## 2015-12-18 LAB — LACTIC ACID, PLASMA: LACTIC ACID, VENOUS: 1.6 mmol/L (ref 0.5–1.9)

## 2015-12-18 LAB — URINE DRUG SCREEN, QUALITATIVE (ARMC ONLY)
Amphetamines, Ur Screen: NOT DETECTED
BARBITURATES, UR SCREEN: NOT DETECTED
BENZODIAZEPINE, UR SCRN: NOT DETECTED
CANNABINOID 50 NG, UR ~~LOC~~: NOT DETECTED
Cocaine Metabolite,Ur ~~LOC~~: NOT DETECTED
MDMA (Ecstasy)Ur Screen: NOT DETECTED
Methadone Scn, Ur: NOT DETECTED
Opiate, Ur Screen: POSITIVE — AB
PHENCYCLIDINE (PCP) UR S: NOT DETECTED
Tricyclic, Ur Screen: POSITIVE — AB

## 2015-12-18 LAB — ACETAMINOPHEN LEVEL: Acetaminophen (Tylenol), Serum: <10 ug/mL — ABNORMAL LOW (ref 10–30)

## 2015-12-18 LAB — SALICYLATE LEVEL: SALICYLATE LVL: 22.2 mg/dL (ref 2.8–30.0)

## 2015-12-18 LAB — TRIGLYCERIDES: TRIGLYCERIDES: 112 mg/dL (ref <150)

## 2015-12-18 MED ORDER — FAMOTIDINE IN NACL 20-0.9 MG/50ML-% IV SOLN
20.0000 mg | Freq: Two times a day (BID) | INTRAVENOUS | Status: DC
  Administered 2015-12-19 – 2015-12-20 (×4): 20 mg via INTRAVENOUS
  Filled 2015-12-18 (×7): qty 50

## 2015-12-18 MED ORDER — PROPOFOL 1000 MG/100ML IV EMUL
0.0000 ug/kg/min | INTRAVENOUS | Status: DC
  Administered 2015-12-19: 35 ug/kg/min via INTRAVENOUS
  Administered 2015-12-19: 40 ug/kg/min via INTRAVENOUS
  Filled 2015-12-18 (×2): qty 100

## 2015-12-18 MED ORDER — QUETIAPINE FUMARATE 200 MG PO TABS: 300.0000 mg | ORAL_TABLET | Freq: Every day | ORAL | Status: DC

## 2015-12-18 MED ORDER — ETOMIDATE 2 MG/ML IV SOLN
10.0000 mg | Freq: Once | INTRAVENOUS | Status: AC
  Administered 2015-12-18: 10 mg via INTRAVENOUS

## 2015-12-18 MED ORDER — ATORVASTATIN CALCIUM 20 MG PO TABS
20.0000 mg | ORAL_TABLET | Freq: Every day | ORAL | Status: DC
  Administered 2015-12-19 – 2015-12-20 (×2): 20 mg via NASOGASTRIC
  Filled 2015-12-18 (×2): qty 1

## 2015-12-18 MED ORDER — VENLAFAXINE HCL ER 75 MG PO CP24: 150.0000 mg | ORAL_CAPSULE | Freq: Every day | ORAL | Status: DC

## 2015-12-18 MED ORDER — FOLIC ACID 5 MG/ML IJ SOLN
1.0000 mg | Freq: Every day | INTRAMUSCULAR | Status: DC
  Administered 2015-12-19 – 2015-12-21 (×3): 1 mg via INTRAVENOUS
  Filled 2015-12-18 (×4): qty 0.2

## 2015-12-18 MED ORDER — SODIUM CHLORIDE 0.9 % IV SOLN: INTRAVENOUS | Status: DC

## 2015-12-18 MED ORDER — ACETAMINOPHEN 325 MG PO TABS: 650.0000 mg | ORAL_TABLET | Freq: Four times a day (QID) | ORAL | Status: DC | PRN

## 2015-12-18 MED ORDER — KETOROLAC TROMETHAMINE 30 MG/ML IJ SOLN
INTRAMUSCULAR | Status: AC
  Filled 2015-12-18: qty 1

## 2015-12-18 MED ORDER — DOXEPIN HCL 100 MG PO CAPS
100.0000 mg | ORAL_CAPSULE | Freq: Every day | ORAL | Status: DC
  Filled 2015-12-18: qty 1

## 2015-12-18 MED ORDER — SODIUM CHLORIDE 0.9 % IV SOLN
1000.0000 mg | INTRAVENOUS | Status: AC
  Administered 2015-12-18: 1000 mg via INTRAVENOUS
  Filled 2015-12-18: qty 10

## 2015-12-18 MED ORDER — IPRATROPIUM-ALBUTEROL 0.5-2.5 (3) MG/3ML IN SOLN: 3.0000 mL | Freq: Four times a day (QID) | RESPIRATORY_TRACT | Status: DC | PRN

## 2015-12-18 MED ORDER — FENTANYL 2500MCG IN NS 250ML (10MCG/ML) PREMIX INFUSION
150.0000 ug/h | INTRAVENOUS | Status: DC
  Administered 2015-12-18: 150 ug/h via INTRAVENOUS
  Filled 2015-12-18: qty 250

## 2015-12-18 MED ORDER — PROPOFOL 10 MG/ML IV BOLUS
50.0000 mg | Freq: Once | INTRAVENOUS | Status: AC
  Administered 2015-12-18: 50 mg via INTRAVENOUS

## 2015-12-18 MED ORDER — MAGNESIUM HYDROXIDE 400 MG/5ML PO SUSP: 30.0000 mL | Freq: Every day | ORAL | Status: DC | PRN

## 2015-12-18 MED ORDER — MIDAZOLAM HCL 2 MG/2ML IJ SOLN
2.0000 mg | INTRAMUSCULAR | Status: AC | PRN
  Administered 2015-12-19 (×3): 2 mg via INTRAVENOUS
  Filled 2015-12-18 (×2): qty 2

## 2015-12-18 MED ORDER — ATORVASTATIN CALCIUM 20 MG PO TABS
20.0000 mg | ORAL_TABLET | Freq: Every day | ORAL | Status: DC
  Administered 2015-12-18: 20 mg via ORAL
  Filled 2015-12-18: qty 1

## 2015-12-18 MED ORDER — LAMOTRIGINE 25 MG PO TABS
200.0000 mg | ORAL_TABLET | Freq: Every day | ORAL | Status: DC
  Administered 2015-12-20 – 2015-12-21 (×2): 200 mg via NASOGASTRIC
  Filled 2015-12-18: qty 2
  Filled 2015-12-18: qty 8

## 2015-12-18 MED ORDER — LAMOTRIGINE 100 MG PO TABS
200.0000 mg | ORAL_TABLET | Freq: Every day | ORAL | Status: DC
  Administered 2015-12-18: 200 mg via ORAL
  Filled 2015-12-18: qty 2

## 2015-12-18 MED ORDER — PROPOFOL 1000 MG/100ML IV EMUL
5.0000 ug/kg/min | Freq: Once | INTRAVENOUS | Status: AC
  Administered 2015-12-18: 5 ug/kg/min via INTRAVENOUS

## 2015-12-18 MED ORDER — CHLORHEXIDINE GLUCONATE 0.12% ORAL RINSE (MEDLINE KIT)
15.0000 mL | Freq: Two times a day (BID) | OROMUCOSAL | Status: DC
  Administered 2015-12-19 – 2015-12-20 (×3): 15 mL via OROMUCOSAL

## 2015-12-18 MED ORDER — ATORVASTATIN CALCIUM 20 MG PO TABS: 20.0000 mg | ORAL_TABLET | Freq: Every day | ORAL | Status: DC

## 2015-12-18 MED ORDER — VECURONIUM BROMIDE 10 MG IV SOLR
10.0000 mg | Freq: Once | INTRAVENOUS | Status: AC
Start: 2015-12-18 — End: 2015-12-18
  Administered 2015-12-18: 10 mg via INTRAVENOUS

## 2015-12-18 MED ORDER — SODIUM CHLORIDE 0.9 % IV SOLN
250.0000 mL | INTRAVENOUS | Status: DC | PRN
  Administered 2015-12-19: 250 mL via INTRAVENOUS

## 2015-12-18 MED ORDER — PROPOFOL 1000 MG/100ML IV EMUL
0.0000 ug/kg/min | INTRAVENOUS | Status: DC
  Administered 2015-12-18: 40 ug/kg/min via INTRAVENOUS

## 2015-12-18 MED ORDER — SODIUM CHLORIDE 0.9 % IV BOLUS (SEPSIS)
1000.0000 mL | Freq: Once | INTRAVENOUS | Status: AC
  Administered 2015-12-18: 1000 mL via INTRAVENOUS

## 2015-12-18 MED ORDER — SODIUM CHLORIDE 0.9 % IV SOLN
500.0000 mg | Freq: Two times a day (BID) | INTRAVENOUS | Status: DC
  Administered 2015-12-19 – 2015-12-21 (×5): 500 mg via INTRAVENOUS
  Filled 2015-12-18 (×8): qty 5

## 2015-12-18 MED ORDER — DOXEPIN HCL 50 MG PO CAPS: 100.0000 mg | ORAL_CAPSULE | Freq: Every day | ORAL | Status: DC

## 2015-12-18 MED ORDER — MIDAZOLAM HCL 5 MG/5ML IJ SOLN
4.0000 mg | Freq: Once | INTRAMUSCULAR | Status: AC
  Administered 2015-12-18: 4 mg via INTRAVENOUS

## 2015-12-18 MED ORDER — LAMOTRIGINE 100 MG PO TABS: 200.0000 mg | ORAL_TABLET | Freq: Every day | ORAL | Status: DC

## 2015-12-18 MED ORDER — THIAMINE HCL 100 MG/ML IJ SOLN
100.0000 mg | Freq: Every day | INTRAMUSCULAR | Status: DC
  Administered 2015-12-19 – 2015-12-20 (×3): 100 mg via INTRAVENOUS
  Filled 2015-12-18 (×4): qty 2

## 2015-12-18 MED ORDER — BISACODYL 10 MG RE SUPP: 10.0000 mg | Freq: Every day | RECTAL | Status: DC | PRN

## 2015-12-18 MED ORDER — ALUM & MAG HYDROXIDE-SIMETH 200-200-20 MG/5ML PO SUSP: 30.0000 mL | ORAL | Status: DC | PRN

## 2015-12-18 MED ORDER — SENNOSIDES 8.8 MG/5ML PO SYRP
5.0000 mL | ORAL_SOLUTION | Freq: Two times a day (BID) | ORAL | Status: DC | PRN
  Filled 2015-12-18: qty 5

## 2015-12-18 MED ORDER — ORAL CARE MOUTH RINSE
15.0000 mL | Freq: Four times a day (QID) | OROMUCOSAL | Status: DC
  Administered 2015-12-19 (×3): 15 mL via OROMUCOSAL

## 2015-12-18 MED ORDER — MIDAZOLAM HCL 2 MG/2ML IJ SOLN
2.0000 mg | INTRAMUSCULAR | Status: DC | PRN
  Administered 2015-12-19: 2 mg via INTRAVENOUS
  Filled 2015-12-18 (×2): qty 2

## 2015-12-18 MED ORDER — HEPARIN SODIUM (PORCINE) 5000 UNIT/ML IJ SOLN
5000.0000 [IU] | Freq: Three times a day (TID) | INTRAMUSCULAR | Status: DC
  Administered 2015-12-19 – 2015-12-21 (×8): 5000 [IU] via SUBCUTANEOUS
  Filled 2015-12-18 (×8): qty 1

## 2015-12-18 MED ORDER — ONDANSETRON HCL 4 MG/2ML IJ SOLN: 4.0000 mg | Freq: Four times a day (QID) | INTRAMUSCULAR | Status: DC | PRN

## 2015-12-18 MED ORDER — ONDANSETRON HCL 4 MG/2ML IJ SOLN
INTRAMUSCULAR | Status: AC
  Filled 2015-12-18: qty 2

## 2015-12-18 MED ORDER — SUCCINYLCHOLINE CHLORIDE 20 MG/ML IJ SOLN
100.0000 mg | Freq: Once | INTRAMUSCULAR | Status: AC
  Administered 2015-12-18: 100 mg via INTRAVENOUS

## 2015-12-18 NOTE — Progress Notes (Signed)
Pt still unresponsive. Rapid called. Pt had emesis x2 brown in color. Being transferred back to ED. AC and MD present.

## 2015-12-18 NOTE — BH Assessment (Signed)
Tele Assessment Note   Patricia [name] is an 55 y.o. female.   Patricia [name] is a 55 y.o. female who presents to the ED voluntarily with of Bipolar. Patient describes her baseline as having daily passive suicidal thoughts with no plan. Yesterday she became angry with her husband while they were driving in the car. She came home and overdosed that afternoon. She overdosed again this morning and called EMS.   "Pt admits to SI and states "i just want to die". Bruise to left upper arm from previous fall. Old laceration marks to wrist. Per EMS report pt took unknown pills. Many bottles were missing pills including Ambien, metoprolol, ASA,. Pupils pinpoint for EMS and did respond to narcan. They did not see narcotic bottles. Pt alert and fidgety on arrival."  Patient describes increasing symptoms of depression, agitation, hopelessness.  No HI or A/V. Patient presents with disheveled appearance, agitated motor behavior, slowed thought and speech, oriented, poor judgement and insight.   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 She currently has a diagnosis of Stimulant Disorder and Opioid use disorder. Patient was recently in inpatient treatment, discharged Sept 2017. She was to have dual diagnosis treatment. It is unknown if she followed up with her treatment plan. Patient receives services from [facility] with Dr. Suzie Portela.     Diagnosis: Bipolar 2 disorder, major depressive episode; Opioid use disorder, moderate, dependence; Stimulant use Disorder  Past Medical History:  Past Medical History:  Diagnosis Date  . Alcohol abuse unk  . Anxiety unk  . Aspiration into airway   . Depression unk    Past Surgical History:  Procedure Laterality Date  . ABDOMINAL HYSTERECTOMY    . FOOT SURGERY    . TRACHEAL SURGERY      Family History:  Family History  Problem Relation Age of Onset  . Breast cancer Mother   . Hypertension Mother   . Depression Mother   . Anxiety disorder Mother   .  Alcohol abuse Father   . Alcohol abuse Sister   . Anxiety disorder Sister   . Bipolar disorder Sister     Social History:  reports that she has quit smoking. Her smoking use included Cigarettes. She has never used smokeless tobacco. She reports that she does not drink alcohol or use drugs.  Additional Social History:  Alcohol / Drug Use Pain Medications: see MAR Prescriptions: see MAR Over the Counter: see MAR History of alcohol / drug use?: Yes Longest period of sobriety (when/how long): Unknown  CIWA: CIWA-Ar BP: (!) 130/91 Pulse Rate: (!) 118 COWS:    PATIENT STRENGTHS: (choose at least two) Average or above average intelligence Capable of independent living  Allergies:  Allergies  Allergen Reactions  . Penicillins Hives    Has patient had a PCN reaction causing immediate rash, facial/tongue/throat swelling, SOB or lightheadedness with hypotension: Yes Has patient had a PCN reaction causing severe rash involving mucus membranes or skin necrosis: No Has patient had a PCN reaction that required hospitalization Yes Has patient had a PCN reaction occurring within the last 10 years: Yes If all of the above answers are "NO", then may proceed with Cephalosporin use.     Home Medications:  (Not in a hospital admission)  OB/GYN Status:  No LMP recorded. Patient has had a hysterectomy.  General Assessment Data Location of Assessment: [facility] ED TTS Assessment: In system Is this a Tele or Face-to-Face Assessment?: Face-to-Face Is this an Initial Assessment or a Re-assessment for this  encounter?: Initial Assessment Marital status: Married RandMaiden name: Melino Is patient pregnant?: No Pregnancy Status: No Living Arrangements: Spouse/significant other Can pt return to current living arrangement?: Yes Admission Status: Voluntary Is patient capable of signing voluntary admission?: Yes Referral Source: Self/Family/Friend Insurance type: Scientist, research (physical sciences)BCBS  Medical Screening Exam [facility] Walk-in  ONLY) Medical Exam completed: Yes  Crisis Care Plan Living Arrangements: Spouse/significant other Legal Guardian: Other: Name of Psychiatrist: D.r Moffitt  Education Status Is patient currently in school?: No Current Grade: n/a Highest grade of school patient has completed: 12th Grade Name of school: n/a Contact person: n/a  Risk to self with the past 6 months Suicidal Ideation: Yes-Currently Present Has patient been a risk to self within the past 6 months prior to admission? : Yes Suicidal Intent: Yes-Currently Present Has patient had any suicidal intent within the past 6 months prior to admission? : Yes Is patient at risk for suicide?: Yes Suicidal Plan?: Yes-Currently Present Has patient had any suicidal plan within the past 6 months prior to admission? : Yes Specify Current Suicidal Plan: Patient overdosed Access to Means: Yes Specify Access to Suicidal Means: Pt has medications What has been your use of drugs/alcohol within the last 12 months?: alcohol Previous Attempts/Gestures: Yes How many times?: 3 Triggers for Past Attempts: Unknown Intentional Self Injurious Behavior: Cutting Comment - Self Injurious Behavior: history of cutting wrist Family Suicide History: Unknown Recent stressful life event(s): Other (Comment) Persecutory voices/beliefs?: No Depression: Yes Depression Symptoms: Loss of interest in usual pleasures, Feeling angry/irritable Substance abuse history and/or treatment for substance abuse?: Yes Suicide prevention information given to non-admitted patients: Not applicable  Risk to Others within the past 6 months Homicidal Ideation: No Does patient have any lifetime risk of violence toward others beyond the six months prior to admission? : No Thoughts of Harm to Others: No Current Homicidal Intent: No Current Homicidal Plan: No Access to Homicidal Means: No History of harm to others?: No Assessment of Violence: None Noted Does patient have access  to weapons?: No Criminal Charges Pending?: No Does patient have a court date: No Is patient on probation?: No  Psychosis Hallucinations: None noted Delusions: None noted  Mental Status Report Appearance/Hygiene: Unremarkable, Disheveled Eye Contact: Fair Motor Activity: Agitation Speech: Slow Level of Consciousness: Drowsy Mood: Depressed Affect: Flat Anxiety Level: Moderate Thought Processes: Circumstantial Judgement: Impaired Orientation: Appropriate for developmental age Obsessive Compulsive Thoughts/Behaviors: None  Cognitive Functioning Concentration: Decreased Memory: Recent Intact, Remote Intact IQ: Average Insight: Poor Impulse Control: Poor Appetite: Fair Sleep: No Change  ADLScreening [facility] Assessment Services) Patient's cognitive ability adequate to safely complete daily activities?: Yes Independently performs ADLs?: Yes (appropriate for developmental age)  Prior Inpatient Therapy Prior Inpatient Therapy: Yes [facility] Facilty[facility]Provider(s): 2012, 2017 Reason for Treatment: depression  Prior Outpatient Therapy Prior Outpatient Therapy: Yes Prior Therapy Dates: Current Reason for Treatment: Depression and alcohol use Does patient have an ACCT team?: No Does patient have Intensive In-House Services?  : No Does patient have [facility] services? : No Does patient have P4CC services?: No  ADL Screening (condition at time of admission) Patient's cognitive ability adequate to safely complete daily activities?: Yes Is the patient deaf or have difficulty hearing?: No Does the patient have difficulty seeing, even when wearing glasses/contacts?: No Does the patient have difficulty concentrating, remembering, or making decisions?: No Does the patient have difficulty dressing or bathing?: No Independently performs ADLs?: Yes (appropriate for developmental age)       Abuse/Neglect Assessment (  Assessment to be complete while  patient is alone) Physical Abuse: Denies Verbal Abuse: Denies Sexual Abuse: Denies          Additional Information 1:1 In Past 12 Months?: No CIRT Risk: No Elopement Risk: No Does patient have medical clearance?: No  Child/Adolescent Assessment Running Away Risk: Denies Bed-Wetting: Denies Destruction of Property: Denies Cruelty to Animals: Denies Stealing: Denies Rebellious/Defies Authority: Denies Satanic Involvement: Denies Archivist: Denies Problems at [employer]: Denies Gang Involvement: Denies  Disposition:  Disposition Initial Assessment Completed for this Encounter: Yes Disposition of Patient: Referred to (Dr. Toni Amend to see patient ) Patient referred to:  (waiting for disposition, see note for update)  Westley Hummer 12/18/2015 1:09 PM

## 2015-12-18 NOTE — Progress Notes (Signed)
Chaplain received a medical alert page for a patient in behavioral unit who was having a seizure. Chaplain visited Pt and met with the medical team moving the Pt to Ed, where she was being treated and observed before being moved to ICU. Patient was not verbal, and chaplain couldn't find any information about the Pt's family and was not able to notify family. Note: A follow-up is needed for this patient.     12/18/15 2300  Clinical Encounter Type  Visited With Patient  Visit Type Initial  Referral From Nurse  Consult/Referral To Chaplain  Spiritual Encounters  Spiritual Needs Prayer;Other (Comment)

## 2015-12-18 NOTE — Progress Notes (Signed)
ETT advanced to 25cm at the lip.Resecured and tolerated well.

## 2015-12-18 NOTE — Progress Notes (Signed)
Received patient to room ICU 15 and placed her on Servo vent without complication.  Patient tolerating well at this time, will continue to monitor.

## 2015-12-18 NOTE — ED Provider Notes (Signed)
[facility] Emergency Department Provider Note  ____________________________________________   First MD Initiated Contact with Patient 12/18/15 2100   (approximate)  I have reviewed the triage vital signs and the nursing notes.   HISTORY  Chief Complaint Drug Overdose  LEVEL 5 CAVEAT - PATIENT UNRESPONSIVE AND CANNOT PROVIDE HISTORY  HPI Patricia [name] is a 55 y.o. female seen earlier today in this emergency department for an intentional drug overdose of multiple substances.  She was medically stable, conversant,ambulated to the bathroom without difficulty, and was taken down stairs to behavioral medicine for further treatment.  When she got down to behavioral medicine the ED tech that had been with her noted that she seemed somewhat more confused that she had been.  Over a relatively rapid period of time she became less and less responsive, vomited, and began having seizure-like activity.  A rapid response was called and the [place] unit nurse called to my charge nurse and asked that we come down to evaluate the patient.  We ran down to the behavioral unit and I found that she was unresponsive sitting up in a wheelchair, vomiting over the left side and having seizure-like activity.  She did not respond to noxious stimuli, had no corneal reflex when I touch her eyelids, and her eyes rolled back in her head.  We used a Yankauer to suction her airway and she had a very minimal reaction, not a full gag reflex.  I made the decision that it would be safest for her to transport her emergently back to the emergency department rather than try to protect her airway in the behavioral unit.  See below for further details.   Past Medical History:  Diagnosis Date  . Alcohol abuse unk  . Anxiety unk  . Aspiration into airway   . Depression unk    Patient Active Problem List   Diagnosis Date Noted  . Suicidal ideation 12/18/2015  . Involuntary commitment 12/18/2015  .  Bipolar 2 disorder (HCC) 12/18/2015  . Seizure (HCC) 12/18/2015  . Opiate overdose 09/28/2015  . Stimulant use disorder 09/28/2015  . Bipolar 2 disorder, major depressive episode (HCC) 09/28/2015  . Opioid use disorder, moderate, dependence (HCC) 09/28/2015  . 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 H/O eating disorder 09/19/2013    Past Surgical History:  Procedure Laterality Date  . ABDOMINAL HYSTERECTOMY    . FOOT SURGERY    . TRACHEAL SURGERY      Prior to Admission medications   Medication Sig Start Date End Date Taking? Authorizing Provider  asenapine (SAPHRIS) 5 MG SUBL 24 hr tablet Place 5 mg under the tongue daily. nightly   Yes Historical Provider, MD  atorvastatin (LIPITOR) 20 MG tablet Take 20 mg by mouth at bedtime.   Yes Historical Provider, MD  doxepin (SINEQUAN) 100 MG capsule Take 1 capsule (100 mg total) by mouth at bedtime. 10/29/15  Yes Jolanta B Pucilowska, MD  folic acid (FOLVITE) 1 MG tablet Take 1 mg by mouth daily.   Yes Historical Provider, MD  lamoTRIgine (LAMICTAL) 200 MG tablet Take 1 tablet (200 mg total) by mouth daily. 10/29/15  Yes Jolanta B Pucilowska, MD  QUEtiapine (SEROQUEL) 300 MG tablet Take 1 tablet (300 mg total) by mouth at bedtime. 10/29/15  Yes Shari Prows, MD  venlafaxine XR (EFFEXOR-XR) 150 MG 24 hr capsule Take 1 capsule (150 mg total) by mouth daily with breakfast. 10/29/15  Yes Jolanta B Pucilowska, MD  naloxone HCl 4  MG/0.1ML LIQD administer in case of opioid overdose. 10/29/15   Shari ProwsJolanta B Pucilowska, MD    Allergies Penicillins  Family History  Problem Relation Age of Onset  . Breast cancer Mother   . Hypertension Mother   . Depression Mother   . Anxiety disorder Mother   . Alcohol abuse Father   . Alcohol abuse Sister   . Anxiety disorder Sister   . Bipolar disorder Sister     Social History Social History  Substance Use Topics  . Smoking status: Former Smoker    Types: Cigarettes  .  Smokeless tobacco: Never Used  . Alcohol use No     Comment: former alcoholic "sober for a month now"    Review of Systems  Level 5 caveat-patient unresponsive and critically ill ____________________________________________   PHYSICAL EXAM:  VITAL SIGNS: ED Triage Vitals [12/18/15 0936]  Enc Vitals Group     BP 124/63     Pulse Rate (!) 119     Resp 20     Temp 97.3 F (36.3 C)     Temp Source Oral     SpO2 95 %     Weight 150 lb (68 kg)     Height      Head Circumference      Peak Flow      Pain Score      Pain Loc      Pain Edu?      Excl. in GC?     Constitutional: Patient appears acutely ill, unresponsive, having seizure-like activity, vomiting, not protecting airway Eyes: Eyes rolled up superiorly, I cannot appreciate her pupillary exam Head: Atraumatic. Nose: No congestion/rhinnorhea. Mouth/Throat: Mucous membranes are moist.  Emesis present in mouth Cardiovascular: Normal rate, regular rhythm. Good peripheral circulation. Grossly normal heart sounds. Respiratory: Decreased respiratory rate but still breathing spontaneously Gastrointestinal: Soft and nontender. No distention.  Musculoskeletal: No lower extremity tenderness nor edema. No gross deformities of extremities. Neurologic:  GCS 5, patient seems to be seizing, no corneal reflex, minimal gag reflex, not protecting airway Skin:  Skin is warm, diaphoretic, and intact. No rash noted.   ____________________________________________   LABS (all labs ordered are listed, but only abnormal results are displayed)  Labs Reviewed  COMPREHENSIVE METABOLIC PANEL - Abnormal; Notable for the following:       Result Value   Chloride 100 (*)    Glucose, Bld 114 (*)    AST 86 (*)    All other components within normal limits  ACETAMINOPHEN LEVEL - Abnormal; Notable for the following:    Acetaminophen (Tylenol), Serum <10 (*)    All other components within normal limits  URINE DRUG SCREEN, QUALITATIVE ([facility] ONLY)  - Abnormal; Notable for the following:    Tricyclic, Ur Screen POSITIVE (*)    Opiate, Ur Screen POSITIVE (*)    All other components within normal limits  GLUCOSE, CAPILLARY - Abnormal; Notable for the following:    Glucose-Capillary 116 (*)    All other components within normal limits  ETHANOL  SALICYLATE LEVEL  CBC   ____________________________________________  EKG  ED ECG REPORT I, Aldine Chakraborty, the attending physician, personally viewed and interpreted this ECG.  Date: 12/18/2015 EKG Time: 21:56 Rate: 108 Rhythm: Sinus tachycardia QRS Axis: normal Intervals: normal ST/T Wave abnormalities: normal Conduction Disturbances: none Narrative Interpretation: unremarkable  ____________________________________________  RADIOLOGY   Ct Head Wo Contrast  Result Date: 12/18/2015 CLINICAL DATA:  55 year old female with seizures and unresponsiveness EXAM: CT HEAD WITHOUT CONTRAST TECHNIQUE:  Contiguous axial images were obtained from the base of the skull through the vertex without intravenous contrast. COMPARISON:  Head CT dated 02/06/2015 FINDINGS: Brain: No evidence of acute infarction, hemorrhage, hydrocephalus, extra-axial collection or mass lesion/mass effect. Vascular: No hyperdense vessel or unexpected calcification. Skull: Normal. Negative for fracture or focal lesion. Sinuses/Orbits: No acute finding. Other: None IMPRESSION: No acute intracranial pathology. Electronically Signed   By: Elgie Collard M.D.   On: 12/18/2015 22:51   Dg Chest Portable 1 View  Result Date: 12/18/2015 CLINICAL DATA:  Endotracheal placement. EXAM: PORTABLE CHEST 1 VIEW COMPARISON:  10/23/2015 FINDINGS: Endotracheal tip is 5.5 cm proximal to the carina. Heart size is normal. Mediastinal shadows are normal. There may be mild interstitial edema and minimal basilar atelectasis. Chronic granuloma right upper lobe as seen previously. No acute bone finding. IMPRESSION: Endotracheal tube tip 5.5 cm above  the carina. Probable mild interstitial edema. Mild basilar atelectasis. Electronically Signed   By: Paulina Fusi M.D.   On: 12/18/2015 21:43    ____________________________________________   PROCEDURES  Procedure(s) performed:   .Intubation Date/Time: 12/18/2015 9:37 PM Performed by: Loleta Rose Authorized by: Loleta Rose   Consent:    Consent obtained:  Emergent situation Pre-procedure details:    Patient status:  Unresponsive   Pretreatment meds: etomidate 10 mg IV.   Paralytics:  Succinylcholine Procedure details:    Preoxygenation:  Bag valve mask   Intubation method:  Oral   Oral intubation technique:  Video-assisted   Tube type:  Cuffed   Number of attempts:  1   Ventilation between attempts: no     Cricoid pressure: yes     Tube visualized through cords: yes   Placement assessment:    ETT to lip:  23   Tube secured with:  ETT holder   Breath sounds:  Equal and absent over the epigastrium   Placement verification: chest rise, CXR verification, equal breath sounds and ETCO2 detector     CXR findings:  ETT in proper place Post-procedure details:    Patient tolerance of procedure:  Tolerated well, no immediate complications .Critical Care Performed by: Loleta Rose Authorized by: Loleta Rose   Critical care provider statement:    Critical care time (minutes):  30   Critical care time was exclusive of:  Separately billable procedures and treating other patients   Critical care was necessary to treat or prevent imminent or life-threatening deterioration of the following conditions:  Respiratory failure and CNS failure or compromise   Critical care was time spent personally by me on the following activities:  Development of treatment plan with patient or surrogate, discussions with consultants, evaluation of patient's response to treatment, examination of patient, obtaining history from patient or surrogate, ordering and performing treatments and interventions,  ordering and review of laboratory studies, ordering and review of radiographic studies, pulse oximetry, re-evaluation of patient's condition and review of old charts     Critical Care performed: Yes, see critical care procedure note(s) ____________________________________________   INITIAL IMPRESSION / ASSESSMENT AND PLAN / ED COURSE  Pertinent labs & imaging results that were available during my care of the patient were reviewed by me and considered in my medical decision making (see chart for details).  The patient was not protecting her airway and seemed to be having seizure-like activity.  I made the decision to RSI her in order to protect the airway.  The patient was accomplished quickly and without complications.  I started the patient on a propofol drip  aching that this would help with seizure management as well.  She was somewhat combative afterwards and required 2 additional boluses of a fall of there was not purposeful activity, she was simply thrashing around and endangering the ET tube.  I gave her Versed 4 mg IV in the somewhat unlikely event that this is alcohol related; she has a distant history of alcohol abuse but had a negative alcohol level earlier this morning when she was first seen and reportedly told the ED physician and the psychiatrist that she has not been drinking recently which should mean that she is not having complicated alcohol withdrawal seizures.  I discussed the case with the eLink intensivist and she agreed with the plan.  I asked for her recommendation regarding fosphenytoin bolus and she recommended Keppra instead so I have ordered Keppra 1 g IV.  I also gave vecuronium 10 mg IV given the patient's persistent agitation and I have started a fentanyl drip at 150 g per hour to help with pain control in addition to the propofol.  I will order a stat head CT once she is settled in with her ET tube given the acute decompensation exhibited by the patient.     Clinical Course  Comment By Time  ETT advanced 2 cm.  Head CT unremarkable.  Hemodynamic status improved.  Discussed in person with ICU PA.   Loleta Rose, MD 10/20 2337    ____________________________________________  FINAL CLINICAL IMPRESSION(S) / ED DIAGNOSES  Final diagnoses:  Intentional drug overdose, initial encounter (HCC)  Depression, unspecified depression type  Acute respiratory failure, unspecified whether with hypoxia or hypercapnia (HCC)  Seizure (HCC)     MEDICATIONS GIVEN DURING THIS VISIT:  Medications - No data to display   NEW OUTPATIENT MEDICATIONS STARTED DURING THIS VISIT:  Discharge Medication List as of 12/18/2015  7:50 PM      Discharge Medication List as of 12/18/2015  7:50 PM      Discharge Medication List as of 12/18/2015  7:50 PM       Note:  This document was prepared using Dragon voice recognition software and may include unintentional dictation errors.    Loleta Rose, MD 12/18/15 [phone]

## 2015-12-18 NOTE — ED Provider Notes (Signed)
[facility] Emergency Department Provider Note ____________________________________________   I have reviewed the triage vital signs and the triage nursing note.  HISTORY  Chief Complaint Drug Overdose   Historian Level V caveat, patient altered level V caveat, patient altered after overdose  HPI Patricia [name] is a 55 y.o. female came in by EMS after reportedly unresponsive after overdose. Patient herself states that she got in an argument with her husband and when she went home she took a handful, unknown exact number of Seroquel tablets which are her prescription. She states that she took 2 pain pills, but does not state exactly what these are. EMS reported giving Narcan with some slight improvement in her mental status. She states that she was angry and has overdosed before, and says that she did not want to live and asked why she took the overdose.    Past Medical History:  Diagnosis Date  . Alcohol abuse unk  . Anxiety unk  . Aspiration into airway   . Depression unk    Patient Active Problem List   Diagnosis Date Noted  . Opiate overdose 09/28/2015  . Stimulant use disorder 09/28/2015  . Bipolar 2 disorder, major depressive episode (HCC) 09/28/2015  . Opioid use disorder, moderate, dependence (HCC) 09/28/2015  . 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 H/O eating disorder 09/19/2013    Past Surgical History:  Procedure Laterality Date  . ABDOMINAL HYSTERECTOMY    . FOOT SURGERY    . TRACHEAL SURGERY      Prior to Admission medications   Medication Sig Start Date End Date Taking? Authorizing Provider  atorvastatin (LIPITOR) 20 MG tablet Take 20 mg by mouth at bedtime.    Historical Provider, MD  clindamycin (CLEOCIN) 300 MG capsule Take 1 capsule (300 mg total) by mouth every 6 (six) hours. 10/29/15   Shari Prows, MD  doxepin (SINEQUAN) 100 MG capsule Take 1 capsule (100 mg total) by mouth at bedtime. 10/29/15    Shari Prows, MD  lamoTRIgine (LAMICTAL) 200 MG tablet Take 1 tablet (200 mg total) by mouth daily. 10/29/15   Shari Prows, MD  naloxone HCl 4 MG/0.1ML LIQD administer in case of opioid overdose. 10/29/15   Shari Prows, MD  QUEtiapine (SEROQUEL) 300 MG tablet Take 1 tablet (300 mg total) by mouth at bedtime. 10/29/15   Shari Prows, MD  venlafaxine XR (EFFEXOR-XR) 150 MG 24 hr capsule Take 1 capsule (150 mg total) by mouth daily with breakfast. 10/29/15   Shari Prows, MD    Allergies  Allergen Reactions  . Penicillins Hives    Has patient had a PCN reaction causing immediate rash, facial/tongue/throat swelling, SOB or lightheadedness with hypotension: Yes Has patient had a PCN reaction causing severe rash involving mucus membranes or skin necrosis: No Has patient had a PCN reaction that required hospitalization Yes Has patient had a PCN reaction occurring within the last 10 years: Yes If all of the above answers are "NO", then may proceed with Cephalosporin use.     Family History  Problem Relation Age of Onset  . Breast cancer Mother   . Hypertension Mother   . Depression Mother   . Anxiety disorder Mother   . Alcohol abuse Father   . Alcohol abuse Sister   . Anxiety disorder Sister   . Bipolar disorder Sister     Social History Social History  Substance Use Topics  . Smoking status: Former Smoker  Types: Cigarettes  . Smokeless tobacco: Never Used  . Alcohol use No     Comment: former alcoholic "sober for a month now"    Review of Systems Unable to obtain due to altered mental status from overdose  She does deny chest pain and trouble breathing  ____________________________________________   PHYSICAL EXAM:  VITAL SIGNS: ED Triage Vitals [12/18/15 0936]  Enc Vitals Group     BP 124/63     Pulse Rate (!) 119     Resp 20     Temp 97.3 F (36.3 C)     Temp Source Oral     SpO2 95 %     Weight 150 lb (68 kg)     Height       Head Circumference      Peak Flow      Pain Score      Pain Loc      Pain Edu?      Excl. in GC?      Constitutional: Awake to voice, falls asleep easily. HEENT   Head: Normocephalic and atraumatic.      Eyes: Conjunctivae are normal. PERRL. Normal extraocular movements.      Ears:         Nose: No congestion/rhinnorhea.   Mouth/Throat: Mucous membranes are moist.   Neck: No stridor. Cardiovascular/Chest: Cardiac, regular rhythm.  No murmurs, rubs, or gallops. Respiratory: Normal respiratory effort without tachypnea nor retractions. Breath sounds are clear and equal bilaterally. No wheezes/rales/rhonchi. Gastrointestinal: Soft. No distention, no guarding, no rebound. Nontender.    Genitourinary/rectal:Deferred Musculoskeletal: Nontender with normal range of motion in all extremities. No joint effusions.  No lower extremity tenderness.  No edema. Neurologic:  No facial droop.  No gross or focal neurologic deficits are appreciated. Skin:  Skin is warm, dry and intact. No rash noted. Psychiatric: Depressed mood and suicidal intent to intentional overdose ____________________________________________  LABS (pertinent positives/negatives)  Labs Reviewed  COMPREHENSIVE METABOLIC PANEL - Abnormal; Notable for the following:       Result Value   Chloride 100 (*)    Glucose, Bld 114 (*)    AST 86 (*)    All other components within normal limits  ACETAMINOPHEN LEVEL - Abnormal; Notable for the following:    Acetaminophen (Tylenol), Serum <10 (*)    All other components within normal limits  URINE DRUG SCREEN, QUALITATIVE ([facility] ONLY) - Abnormal; Notable for the following:    Tricyclic, Ur Screen POSITIVE (*)    Opiate, Ur Screen POSITIVE (*)    All other components within normal limits  GLUCOSE, CAPILLARY - Abnormal; Notable for the following:    Glucose-Capillary 116 (*)    All other components within normal limits  ETHANOL  SALICYLATE LEVEL  CBC  CBG MONITORING,  ED    ____________________________________________    EKG I, Governor Rooksebecca Palmina Clodfelter, MD, the attending physician have personally viewed and interpreted all ECGs.  124 bpm. Sinus tachycardia. Narrow QRS. Normal axis. Nonspecific ST and T-wave ____________________________________________  RADIOLOGY All Xrays were viewed by me. Imaging interpreted by Radiologist.  Chest x-ray 1 view: No active disease. __________________________________________  PROCEDURES  Procedure(s) performed: None  Critical Care performed: CRITICAL CARE Performed by: Governor RooksLORD, Rabon Scholle   Total critical care time: 30 minutes  Critical care time was exclusive of separately billable procedures and treating other patients.  Critical care was necessary to treat or prevent imminent or life-threatening deterioration.  Critical care was time spent personally by me on the following activities: development  of treatment plan with patient and/or surrogate as well as nursing, discussions with consultants, evaluation of patient's response to treatment, examination of patient, obtaining history from patient or surrogate, ordering and performing treatments and interventions, ordering and review of laboratory studies, ordering and review of radiographic studies, pulse oximetry and re-evaluation of patient's condition.   ____________________________________________   ED COURSE / ASSESSMENT AND PLAN  Pertinent labs & imaging results that were available during my care of the patient were reviewed by me and considered in my medical decision making (see chart for details).  Patient arrived after unresponsive from intentional overdose. Patient arousable, but placed on oxygen due to somnolence. Frequent checks needed for neurologic and cardiovascular evaluation, but over time it appeared to be stable.  Patient placed on involuntary commitment after intentional overdose with suicidal intent.  Patient was given IV fluids. Patient will be  monitored here in the emergency department until mental status improves and medically clear for psychiatric disposition.  Patient care transferred at shift change 4 PM, Dr. York Cerise.  Still with symptoms after the overdose, not medically clear yet.    CONSULTATIONS:   TTS and psychiatry.   Patient / Family / Caregiver informed of clinical course, medical decision-making process, and agree with plan.    ___________________________________________   FINAL CLINICAL IMPRESSION(S) / ED DIAGNOSES   Final diagnoses:  Intentional drug overdose, initial encounter (HCC)  Depression, unspecified depression type              Note: This dictation was prepared with Dragon dictation. Any transcriptional errors that result from this process are unintentional    Governor Rooks, MD 12/18/15 [phone]

## 2015-12-18 NOTE — Progress Notes (Signed)
Pt husband contacted and updated regarding Pts placement in ICU. Orland Jarred*Karl Willmann [phone]

## 2015-12-18 NOTE — ED Triage Notes (Signed)
Per EMS report pt took unknown pills. Many bottles were missing pills including ambien, metroprolol, ASA,. Pupils pinpoint for EMS and did respond to narcan. They did not see narcotic bottles. Pt alert and fidgety on arrival.

## 2015-12-18 NOTE — ED Notes (Signed)
Pt to be admitted to [facility] after shift change per Jerilynn Somalvin

## 2015-12-18 NOTE — ED Triage Notes (Signed)
Pt to ED from [facility], per ED Doc, pt was unresponsive, breathing okay with good o2sat, but minimal gag reflex.  PT intubated upon ED arrival

## 2015-12-18 NOTE — H&P (Signed)
PULMONARY / CRITICAL CARE MEDICINE   Name: Patricia [name] MRN: [id number] DOB: 07/21/1960    ADMISSION DATE:  12/18/2015   REFERRING MD: Dr. York CeriseForbach  CHIEF COMPLAINT: Unresponsive  HISTORY OF PRESENT ILLNESS:   This is a 55 y/o female with a PMH of polysubstance abuse, depression, anxiety, bipolar disorder and previous tracheostomy who initially presented with possible overdose on multiple medications and suicide attempt. History is obtained from ED and EMS notes. EMS was initially called because patient took an unknown number of Seroquel and two pain pills after getting into an argument with her husband. EMS found missing Ambien, metoprolol and ASA pills as well. She was given narcan with mild improvement in mental status. At the ED, patient indicated that she wanted to die. She was placed on suicide watch and monitored in the ED for hours. She was medically cleared and transferred to psyche. However, shortly after being in psyche, patient developed a grand mal seizure, became unresponse and was emergently intubated for airway protection. PCCM was asked to admit. Unclear how long seizure activity lasted. No further seizure activity post intubation. No prior h/o seizures Per patient's husband, he is not able to find his Ambien and Norco medication bottles. He states that patient had a similar suicide attempt before in which she took his Norco and Ambien and also hide the Narcan that they have at home, so he won't be able to give it to her.  PAST MEDICAL HISTORY :  She  has a past medical history of Alcohol abuse (unk); Anxiety (unk); Aspiration into airway; and Depression (unk).  PAST SURGICAL HISTORY: She  has a past surgical history that includes Tracheal surgery; Abdominal hysterectomy; and Foot surgery.  Allergies  Allergen Reactions  . Penicillins Hives    Has patient had a PCN reaction causing immediate rash, facial/tongue/throat swelling, SOB or lightheadedness with hypotension:  Yes Has patient had a PCN reaction causing severe rash involving mucus membranes or skin necrosis: No Has patient had a PCN reaction that required hospitalization Yes Has patient had a PCN reaction occurring within the last 10 years: Yes If all of the above answers are "NO", then may proceed with Cephalosporin use.     No current facility-administered medications on file prior to encounter.    Current Outpatient Prescriptions on File Prior to Encounter  Medication Sig  . asenapine (SAPHRIS) 5 MG SUBL 24 hr tablet Place 5 mg under the tongue daily. nightly  . atorvastatin (LIPITOR) 20 MG tablet Take 20 mg by mouth at bedtime.  Marland Kitchen. doxepin (SINEQUAN) 100 MG capsule Take 1 capsule (100 mg total) by mouth at bedtime.  . folic acid (FOLVITE) 1 MG tablet Take 1 mg by mouth daily.  Marland Kitchen. lamoTRIgine (LAMICTAL) 200 MG tablet Take 1 tablet (200 mg total) by mouth daily.  . naloxone HCl 4 MG/0.1ML LIQD administer in case of opioid overdose.  Marland Kitchen. QUEtiapine (SEROQUEL) 300 MG tablet Take 1 tablet (300 mg total) by mouth at bedtime.  Marland Kitchen. venlafaxine XR (EFFEXOR-XR) 150 MG 24 hr capsule Take 1 capsule (150 mg total) by mouth daily with breakfast.    FAMILY HISTORY:  Her indicated that her mother is alive. She indicated that her father is alive. She indicated that both of her sisters are alive. She indicated that her brother is alive.    SOCIAL HISTORY: She  reports that she has quit smoking. Her smoking use included Cigarettes. She has never used smokeless tobacco. She reports that she does not drink  alcohol or use drugs.  REVIEW OF SYSTEMS:   Unable to obtain due to patient's mental status  SUBJECTIVE:   VITAL SIGNS: BP 105/64   Pulse (!) 102   Temp 98.3 F (36.8 C) (Axillary)   Resp (!) 27   Wt 150 lb (68 kg)   SpO2 100%   BMI 24.96 kg/m   HEMODYNAMICS:    VENTILATOR SETTINGS: Vent Mode: AC FiO2 (%):  [28 %-100 %] 28 % Set Rate:  [18 bmp] 18 bmp Vt Set:  [500 mL] 500 mL PEEP:  [5  cmH20] 5 cmH20  INTAKE / OUTPUT: No intake/output data recorded.  PHYSICAL EXAMINATION: General: Disheveled, appropriate for age Neuro: Withdraws to noxious stimulus, sedated HEENT: Head is normocephalic and nontraumatic. Oral mucosa moist, oral cavity with minimal secretions, trachea midline, well-healed tracheostomy scar Cardiovascular:  Mildly tachycardic, S1, S2, no murmur, regurg or gallop Lungs: Clear to auscultation bilaterally without any wheezes or rhonchi Abdomen: Nondistended, normal bowel sounds, palpation reveals no organomegaly Musculoskeletal: No visible deformities Extremities: +2 pulses bilaterally, no edema Skin: Warm, dry with no rash  LABS:  BMET  Recent Labs Lab 12/18/15 0927  NA 138  K 4.0  CL 100*  CO2 26  BUN 15  CREATININE 0.94  GLUCOSE 114*    Electrolytes  Recent Labs Lab 12/18/15 0927  CALCIUM 9.5    CBC  Recent Labs Lab 12/18/15 0927  WBC 9.6  HGB 12.9  HCT 37.5  PLT 235    Coag's No results for input(s): APTT, INR in the last 168 hours.  Sepsis Markers  Recent Labs Lab 12/18/15 2143  LATICACIDVEN 1.6    ABG  Recent Labs Lab 12/18/15 2105  PHART 7.36  PCO2ART 43  PO2ART 524*    Liver Enzymes  Recent Labs Lab 12/18/15 0927  AST 86*  ALT 34  ALKPHOS 105  BILITOT 0.6  ALBUMIN 4.3    Cardiac Enzymes No results for input(s): TROPONINI, PROBNP in the last 168 hours.  Glucose  Recent Labs Lab 12/18/15 0934  GLUCAP 116*    Imaging Ct Head Wo Contrast  Result Date: 12/18/2015 CLINICAL DATA:  55 year old female with seizures and unresponsiveness EXAM: CT HEAD WITHOUT CONTRAST TECHNIQUE: Contiguous axial images were obtained from the base of the skull through the vertex without intravenous contrast. COMPARISON:  Head CT dated 02/06/2015 FINDINGS: Brain: No evidence of acute infarction, hemorrhage, hydrocephalus, extra-axial collection or mass lesion/mass effect. Vascular: No hyperdense vessel or  unexpected calcification. Skull: Normal. Negative for fracture or focal lesion. Sinuses/Orbits: No acute finding. Other: None IMPRESSION: No acute intracranial pathology. Electronically Signed   By: Elgie Collard M.D.   On: 12/18/2015 22:51   Dg Chest Portable 1 View  Result Date: 12/18/2015 CLINICAL DATA:  Endotracheal placement. EXAM: PORTABLE CHEST 1 VIEW COMPARISON:  10/23/2015 FINDINGS: Endotracheal tip is 5.5 cm proximal to the carina. Heart size is normal. Mediastinal shadows are normal. There may be mild interstitial edema and minimal basilar atelectasis. Chronic granuloma right upper lobe as seen previously. No acute bone finding. IMPRESSION: Endotracheal tube tip 5.5 cm above the carina. Probable mild interstitial edema. Mild basilar atelectasis. Electronically Signed   By: Paulina Fusi M.D.   On: 12/18/2015 21:43     STUDIES:  2-D echo ordered  CULTURES: None  ANTIBIOTICS: None  SIGNIFICANT EVENTS: 12/18/2015: ED with overdose, stabilized and transferred to psych; had a grand mal seizure, became obtunded and emergently intubated  LINES/TUBES: ET tube placed 12/18/2015 Peripheral IVs  DISCUSSION: 55 year old  Caucasian female presenting with an overdose on multiple medications, suicide attempt, new onset seizures and respiratory failure secondary to altered mental status  ASSESSMENT / PLAN:  PULMONARY A: Acute respiratory failure secondary to altered mental status/SEIZURE activity Mild pulmonary edema on chest x-ray P:   Full vent support with current settings. VAP protocol Nebulized bronchodilators as needed. Chest x-ray and ABG daily as needed. Monitor fluid status closely; no need for diuresis at this time  CARDIOVASCULAR A:  Sinus tachycardia Mild hypotension-likely due to sedating medications P:  Hemodynamic monitoring per ICU protocol Stat EKG reviewed, no acute ST changes. Maintain MAP greater than or equal 65 When necessary boluses for map less  than 65 Cycle troponin 2-D echo  RENAL A:   No acute issues P:   Trend creatinine Monitor intake and output  GASTROINTESTINAL A:   No acute issues P:   Pepcid for GI prophylaxis  HEMATOLOGIC A:   Mild anemia-baseline hemoglobin 13.7; now down to 11.1 P:  Heparin for VTE prophylaxis Trend CBC  INFECTIOUS A:   No acute issues P:   Monitor WBC and fever curve  ENDOCRINE A:   No acute issues   P:   Monitor blood glucose with BMP  NEUROLOGIC A:   New onset seizures-exact etiology unclear but likely due to medication side effects. Patient is on doxepin which has seizures as a side effect and supposedly overdosed on multiple medications. She was also given Narcan that also has seizures as a side effect. Her CT head was negative for any abnormalities and she has no prior history of seizures. From patient's account, she hasn't had any alcohol in the month and her blood alcohol level upon ED presentation was negative. Her spouse confirmed this. This is probably a medication-induced seizure rather than alcohol withdrawal seizure P:   RASS goal: -1 to -2 Seizure precautions Keppra load given Continue Keppra 500 mg IV twice a day EEG Neurology consult 2-D echo  Psychiatry A: Suicide attempt. History of bipolar disorder. History of depression and anxiety P: Suicide precautions Continue Lamictal 200 mg daily and hold all other antipsychotic medications. Psych consult-psych case already following  Disposition and family update: Admit to ICU. No family at bedside. Spoke with patient's spouse and updated him on patient's current status, as well as inquired about most of the medications that the patient took. He indicates that he will not begin today to see the patient because he is unable to walk. We'll updated with further changes in patient's treatment  Total critical care time=60 minutes  Magdalene S. Tukov ANP-BC Pulmonary and Critical Care Medicine [facility] Pager [phone] or [phone] 12/18/2015, 10:54 PM   Pt seen and examined with NP, agree with assessment and plan, acute respiratory failure due to seizure with obtundation, altered mental status, intubated acutely for acute respiratory failure- not protecting airway.  Seizures currently controlled, on keppra, neuro following, weaning trial today and will consider extubation if tolerated.   Wells Guiles, M.D.  12/19/2015  Critical Care Attestation.  I have personally obtained a history, examined the patient, evaluated laboratory and imaging results, formulated the assessment and plan and placed orders. The Patient requires high complexity decision making for assessment and support, frequent evaluation and titration of therapies, application of advanced monitoring technologies and extensive interpretation of multiple databases. The patient has critical illness that could lead imminently to failure of 1 or more organ systems and requires the highest level of physician preparedness to intervene.  Critical Care Time devoted  to patient care services described in this note is 35 minutes and is exclusive of time spent in procedures.

## 2015-12-18 NOTE — ED Triage Notes (Signed)
Pt admits to SI and states "i just want to die". Bruise to left upper arm from previous fall. Old laceration marks to wrist.

## 2015-12-18 NOTE — Progress Notes (Signed)
Pt received to unit. Report given to RN was that O2 was 99% at 1930. Upon review it was 94. Pt is unable to ambulate, respond to question and is trembling. [facility]

## 2015-12-18 NOTE — ED Provider Notes (Signed)
-----------------------------------------   3:50 PM on 12/18/2015 -----------------------------------------   Blood pressure (!) 130/91, pulse (!) 118, temperature 97.3 F (36.3 C), temperature source Oral, resp. rate 15, weight 68 kg, SpO2 99 %.  Assuming care from Dr. Shaune PollackLord.  In short, Patricia [name] is a 55 y.o. female with a chief complaint of Drug Overdose .  Refer to the original H&P for additional details.  The current plan of care is to monitor for medical clearance.  Patient is awaiting Psych evaluation.  Patient is currently somnolent but otherwise stable.   ----------------------------------------- 4:35 PM on 12/18/2015 -----------------------------------------   BP (!) 130/91   Pulse (!) 118   Temp 97.3 F (36.3 C) (Oral)   Resp 15   Wt 68 kg   SpO2 99%   BMI 24.96 kg/m   I spoke in person with Dr. Toni Amendlapacs who evaluated the patient and will admit the patient downstairs when medically cleared.  We will watch her for a few more hours and should be stable when a bed is assigned.    APPROX 19:00  Patient was conversant, ambulatory to and from the bathroom without difficulty as per ED tech, and taken downstairs for further management.  No indication of acute medical issue at this time.   Loleta Roseory Ayssa Bentivegna, MD 12/18/15 2111

## 2015-12-18 NOTE — Consult Note (Signed)
[facility] Consult   Reason for Consult:  Consult for 55 year old woman with history of depression who comes into the hospital after another suicide attempt Referring Physician:  Reita Cliche [name] MRN:  [id number] Principal Diagnosis: Bipolar 2 disorder, major depressive episode [facility]) Diagnosis:   Patient Active Problem List   Diagnosis Date Noted  . Suicidal ideation [R45.851] 12/18/2015  . Involuntary commitment [Z04.6] 12/18/2015  . Opiate overdose [T40.601A] 09/28/2015  . 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 Bipolar 2 disorder, major depressive episode ([place]) [F31.81] 09/28/2015  . Opioid use disorder, moderate, dependence ([place]) [F11.20] 09/28/2015  . 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 H/O eating disorder [Z86.59] 09/19/2013    Total Time spent with patient: 1 hour  Subjective:   Patricia [name] is a 55 y.o. female patient admitted with "I've got depression".  HPI:  Patient interviewed. Chart reviewed. Labs and vitals reviewed. 55 year old woman with history of depression diagnosed with bipolar disorder type II. Came into the emergency room apparently after taking an overdose of pills at home. History obtained by talking with her and reviewing the chart because she is currently a little bit loopy in her history to me didn't make much sense. She told me that she was driving in the car and sitting in the passenger seat and realize that she could take her seatbelt off and be involved in a car accident and die. It sounds like she is actually talking about something that happened yesterday. When she got home she overdosed by abusing some of her husband's pain medicine. She had admitted to Dr. Reita Cliche earlier that she took Vicodin at home and her drug screen is positive for opiates which is not something she is supposed to be taking. Patient says she still wants to die and feels suicidal and depressed and sad  all the time. Denies having any hallucinations. Doesn't report any particular new stress. We'll discuss in detail anything else about recent substance abuse. Claims that she has been compliant with her medicine.  Social history: Married lives with her husband and 1 adult child. Not working outside the home.  Medical history: Patient has a history of overdoses and abuse of medicine in the past. Past history of a tracheostomy but that's a distant thing no other active medical problems other than the psychiatric now.  Substance abuse history: History of abuse of multiple drugs including opiates and stimulants both of which she had been abusing prior to her last admission.  Past Psychiatric History: Patient has a history of depressive and suicide attempts. She was in the hospital over the summer. Was treated with Lamictal Effexor Seroquel primarily. She follows up with outpatient psychiatric treatment. Doesn't sound like she's had a full-blown psychotic mania in the past.  Risk to Self: Suicidal Ideation: Yes-Currently Present Suicidal Intent: Yes-Currently Present Is patient at risk for suicide?: Yes Suicidal Plan?: Yes-Currently Present Specify Current Suicidal Plan: Patient overdosed Access to Means: Yes Specify Access to Suicidal Means: Pt has medications What has been your use of drugs/alcohol within the last 12 months?: alcohol How many times?: 3 Triggers for Past Attempts: Unknown Intentional Self Injurious Behavior: Cutting Comment - Self Injurious Behavior: history of cutting wrist Risk to Others: Homicidal Ideation: No Thoughts of Harm to Others: No Current Homicidal Intent: No Current Homicidal Plan: No Access to Homicidal Means: No History of harm to others?: No Assessment of Violence: None Noted Does patient have access to weapons?: No Criminal Charges  Pending?: No Does patient have a court date: No Prior Inpatient Therapy: Prior Inpatient Therapy: Yes Prior Therapy Dates:  [place] Prior Therapy Facilty/Provider(s): 2012, 2017 Reason for Treatment: depression Prior Outpatient Therapy: Prior Outpatient Therapy: Yes Prior Therapy Dates: Current Reason for Treatment: Depression and alcohol use Does patient have an ACCT team?: No Does patient have Intensive In-House Services?  : No Does patient have [facility] services? : No Does patient have P4CC services?: No  Past Medical History:  Past Medical History:  Diagnosis Date  . Alcohol abuse unk  . Anxiety unk  . Aspiration into airway   . Depression unk    Past Surgical History:  Procedure Laterality Date  . ABDOMINAL HYSTERECTOMY    . FOOT SURGERY    . TRACHEAL SURGERY     Family History:  Family History  Problem Relation Age of Onset  . Breast cancer Mother   . Hypertension Mother   . Depression Mother   . Anxiety disorder Mother   . Alcohol abuse Father   . Alcohol abuse Sister   . Anxiety disorder Sister   . Bipolar disorder Sister    Family Psychiatric  History: Patient is unable to give me any history right now about family history. Social History:  History  Alcohol Use No    Comment: former alcoholic "sober for a month now"     History  Drug Use No    Social History   Social History  . Marital status: Married    Spouse name: N/A  . Number of children: N/A  . Years of education: N/A   Social History Main Topics  . Smoking status: Former Smoker    Types: Cigarettes  . Smokeless tobacco: Never Used  . Alcohol use No     Comment: former alcoholic "sober for a month now"  . Drug use: No  . Sexual activity: Not Currently   Other Topics Concern  . None   Social History Narrative  . None   Additional Social History:    Allergies:   Allergies  Allergen Reactions  . Penicillins Hives    Has patient had a PCN reaction causing immediate rash, facial/tongue/throat swelling, SOB or lightheadedness with hypotension: Yes Has patient had a PCN reaction causing severe rash  involving mucus membranes or skin necrosis: No Has patient had a PCN reaction that required hospitalization Yes Has patient had a PCN reaction occurring within the last 10 years: Yes If all of the above answers are "NO", then may proceed with Cephalosporin use.     Labs:  Results for orders placed or performed during the hospital encounter of 12/18/15 (from the past 48 hour(s))  Comprehensive metabolic panel     Status: Abnormal   Collection Time: 12/18/15  9:27 AM  Result Value Ref Range   Sodium 138 135 - 145 mmol/L   Potassium 4.0 3.5 - 5.1 mmol/L   Chloride 100 (L) 101 - 111 mmol/L   CO2 26 22 - 32 mmol/L   Glucose, Bld 114 (H) 65 - 99 mg/dL   BUN 15 6 - 20 mg/dL   Creatinine, Ser 0.94 0.44 - 1.00 mg/dL   Calcium 9.5 8.9 - 10.3 mg/dL   Total Protein 7.7 6.5 - 8.1 g/dL   Albumin 4.3 3.5 - 5.0 g/dL   AST 86 (H) 15 - 41 U/L   ALT 34 14 - 54 U/L   Alkaline Phosphatase 105 38 - 126 U/L   Total Bilirubin 0.6 0.3 - 1.2 mg/dL  GFR calc non Af Amer >60 >60 mL/min   GFR calc Af Amer >60 >60 mL/min    Comment: (NOTE) The eGFR has been calculated using the CKD EPI equation. This calculation has not been validated in all clinical situations. eGFR's persistently <60 mL/min signify possible Chronic Kidney Disease.    Anion gap 12 5 - 15  Ethanol     Status: None   Collection Time: 12/18/15  9:27 AM  Result Value Ref Range   Alcohol, Ethyl (B) <5 <5 mg/dL    Comment:        LOWEST DETECTABLE LIMIT FOR SERUM ALCOHOL IS 5 mg/dL FOR MEDICAL PURPOSES ONLY   Salicylate level     Status: None   Collection Time: 12/18/15  9:27 AM  Result Value Ref Range   Salicylate Lvl 86.5 2.8 - 30.0 mg/dL  Acetaminophen level     Status: Abnormal   Collection Time: 12/18/15  9:27 AM  Result Value Ref Range   Acetaminophen (Tylenol), Serum <10 (L) 10 - 30 ug/mL    Comment:        THERAPEUTIC CONCENTRATIONS VARY SIGNIFICANTLY. A RANGE OF 10-30 ug/mL MAY BE AN EFFECTIVE CONCENTRATION FOR MANY  PATIENTS. HOWEVER, SOME ARE BEST TREATED AT CONCENTRATIONS OUTSIDE THIS RANGE. ACETAMINOPHEN CONCENTRATIONS >150 ug/mL AT 4 HOURS AFTER INGESTION AND >50 ug/mL AT 12 HOURS AFTER INGESTION ARE OFTEN ASSOCIATED WITH TOXIC REACTIONS.   cbc     Status: None   Collection Time: 12/18/15  9:27 AM  Result Value Ref Range   WBC 9.6 3.6 - 11.0 K/uL   RBC 4.06 3.80 - 5.20 MIL/uL   Hemoglobin 12.9 12.0 - 16.0 g/dL   HCT 37.5 35.0 - 47.0 %   MCV 92.5 80.0 - 100.0 fL   MCH 31.9 26.0 - 34.0 pg   MCHC 34.5 32.0 - 36.0 g/dL   RDW 14.2 11.5 - 14.5 %   Platelets 235 150 - 440 K/uL  Glucose, capillary     Status: Abnormal   Collection Time: 12/18/15  9:34 AM  Result Value Ref Range   Glucose-Capillary 116 (H) 65 - 99 mg/dL  Urine Drug Screen, Qualitative     Status: Abnormal   Collection Time: 12/18/15 10:45 AM  Result Value Ref Range   Tricyclic, Ur Screen POSITIVE (A) NONE DETECTED   Amphetamines, Ur Screen NONE DETECTED NONE DETECTED   MDMA (Ecstasy)Ur Screen NONE DETECTED NONE DETECTED   Cocaine Metabolite,Ur [place] NONE DETECTED NONE DETECTED   Opiate, Ur Screen POSITIVE (A) NONE DETECTED   Phencyclidine (PCP) Ur S NONE DETECTED NONE DETECTED   Cannabinoid 50 Ng, Ur [place] NONE DETECTED NONE DETECTED   Barbiturates, Ur Screen NONE DETECTED NONE DETECTED   Benzodiazepine, Ur Scrn NONE DETECTED NONE DETECTED   Methadone Scn, Ur NONE DETECTED NONE DETECTED    Comment: (NOTE) 784  Tricyclics, urine               Cutoff 1000 ng/mL 200  Amphetamines, urine             Cutoff 1000 ng/mL 300  MDMA (Ecstasy), urine           Cutoff 500 ng/mL 400  Cocaine Metabolite, urine       Cutoff 300 ng/mL 500  Opiate, urine                   Cutoff 300 ng/mL 600  Phencyclidine (PCP), urine      Cutoff 25 ng/mL 700  Cannabinoid, urine  Cutoff 50 ng/mL 800  Barbiturates, urine             Cutoff 200 ng/mL 900  Benzodiazepine, urine           Cutoff 200 ng/mL 1000 Methadone, urine                 Cutoff 300 ng/mL 1100 1200 The urine drug screen provides only a preliminary, unconfirmed 1300 analytical test result and should not be used for non-medical 1400 purposes. Clinical consideration and professional judgment should 1500 be applied to any positive drug screen result due to possible 1600 interfering substances. A more specific alternate chemical method 1700 must be used in order to obtain a confirmed analytical result.  1800 Gas chromato graphy / mass spectrometry (GC/MS) is the preferred 1900 confirmatory method.     No current facility-administered medications for this encounter.    Current Outpatient Prescriptions  Medication Sig Dispense Refill  . asenapine (SAPHRIS) 5 MG SUBL 24 hr tablet Place 5 mg under the tongue daily. nightly    . atorvastatin (LIPITOR) 20 MG tablet Take 20 mg by mouth at bedtime.    Marland Kitchen doxepin (SINEQUAN) 100 MG capsule Take 1 capsule (100 mg total) by mouth at bedtime. 30 capsule 1  . folic acid (FOLVITE) 1 MG tablet Take 1 mg by mouth daily.    Marland Kitchen lamoTRIgine (LAMICTAL) 200 MG tablet Take 1 tablet (200 mg total) by mouth daily. 30 tablet 1  . QUEtiapine (SEROQUEL) 300 MG tablet Take 1 tablet (300 mg total) by mouth at bedtime. 30 tablet 1  . venlafaxine XR (EFFEXOR-XR) 150 MG 24 hr capsule Take 1 capsule (150 mg total) by mouth daily with breakfast. 30 capsule 1  . naloxone HCl 4 MG/0.1ML LIQD administer in case of opioid overdose. 2 each 2    Musculoskeletal: Strength & Muscle Tone: within normal limits Gait & Station: normal Patient leans: N/A  Psychiatric Specialty Exam: Physical Exam  Nursing note and vitals reviewed. Constitutional: She appears well-developed and well-nourished.  HENT:  Head: Normocephalic and atraumatic.  Eyes: Conjunctivae are normal. Pupils are equal, round, and reactive to light.  Neck: Normal range of motion.  Cardiovascular: Regular rhythm and normal heart sounds.   Respiratory: Effort normal. No respiratory  distress.  GI: Soft.  Musculoskeletal: Normal range of motion.  Neurological: She is alert.  Skin: Skin is warm and dry.  Psychiatric: Her affect is blunt. Her speech is delayed and slurred. She is slowed. She expresses impulsivity. She exhibits a depressed mood. She expresses suicidal ideation. She expresses suicidal plans. She exhibits abnormal recent memory and abnormal remote memory.    Review of Systems  Constitutional: Negative.   HENT: Negative.   Eyes: Negative.   Respiratory: Negative.   Cardiovascular: Negative.   Gastrointestinal: Negative.   Musculoskeletal: Negative.   Skin: Negative.   Neurological: Negative.   Psychiatric/Behavioral: Positive for depression, memory loss, substance abuse and suicidal ideas. Negative for hallucinations. The patient is nervous/anxious and has insomnia.     Blood pressure (!) 130/91, pulse (!) 118, temperature 97.3 F (36.3 C), temperature source Oral, resp. rate 15, weight 68 kg (150 lb), SpO2 99 %.Body mass index is 24.96 kg/m.  General Appearance: Disheveled  Eye Contact:  Fair  Speech:  Slow  Volume:  Decreased  Mood:  Depressed  Affect:  Blunt  Thought Process:  Disorganized  Orientation:  Other:  She didn't know the correct year. She did know she was in the hospital.  Thought Content:  Illogical, Rumination and Tangential  Suicidal Thoughts:  Yes.  with intent/plan  Homicidal Thoughts:  No  Memory:  Immediate;   Good Recent;   Poor Remote;   Fair  Judgement:  Impaired  Insight:  Shallow  Psychomotor Activity:  Decreased  Concentration:  Concentration: Poor  Recall:  Poor  Fund of Knowledge:  Fair  Language:  Fair  Akathisia:  No  Handed:  Right  AIMS (if indicated):     Assets:  Desire for Improvement Financial Resources/Insurance Housing Resilience  ADL's:  Impaired  Cognition:  Impaired,  Mild  Sleep:        Treatment Plan Summary: Daily contact with patient to assess and evaluate symptoms and progress in  treatment, Medication management and Plan 55 year old woman with a history of depression and substance abuse comes in after taking an overdose and reporting suicidal ideation. Right now she is a little sedated but her vital signs are stable and she is maintaining oxygenation. Case reviewed with emergency room doctor. Once she is clearly medically stable she can be admitted to the psychiatric unit. Continue her previous psychiatric medicines including Effexor, Seroquel, lamotrigine, doxepin and atorvastatin. 15 minute checks on the unit. Further treatment to stabilize depression.  Disposition: Recommend psychiatric Inpatient admission when medically cleared. Supportive therapy provided about ongoing stressors.  Alethia Berthold, MD 12/18/2015 4:49 PM

## 2015-12-19 ENCOUNTER — Inpatient Hospital Stay: Payer: BLUE CROSS/BLUE SHIELD

## 2015-12-19 ENCOUNTER — Inpatient Hospital Stay
Admit: 2015-12-19 | Discharge: 2015-12-19 | Disposition: A | Discharge status: Home / Self Care | Payer: BLUE CROSS/BLUE SHIELD | Attending: Adult Health | Admitting: Adult Health

## 2015-12-19 ENCOUNTER — Encounter: Payer: Self-pay | Admitting: Psychiatry

## 2015-12-19 LAB — PHOSPHORUS: PHOSPHORUS: 3.9 mg/dL (ref 2.5–4.6)

## 2015-12-19 LAB — CBC
HCT: 32.1 % — ABNORMAL LOW (ref 35.0–47.0)
HEMOGLOBIN: 11.1 g/dL — AB (ref 12.0–16.0)
MCH: 31.7 pg (ref 26.0–34.0)
MCHC: 34.4 g/dL (ref 32.0–36.0)
MCV: 92 fL (ref 80.0–100.0)
Platelets: 209 10*3/uL (ref 150–440)
RBC: 3.49 MIL/uL — AB (ref 3.80–5.20)
RDW: 14.3 % (ref 11.5–14.5)
WBC: 8 10*3/uL (ref 3.6–11.0)

## 2015-12-19 LAB — BASIC METABOLIC PANEL
Anion gap: 11 (ref 5–15)
BUN: 16 mg/dL (ref 6–20)
CHLORIDE: 105 mmol/L (ref 101–111)
CO2: 23 mmol/L (ref 22–32)
Calcium: 8 mg/dL — ABNORMAL LOW (ref 8.9–10.3)
Creatinine, Ser: 0.92 mg/dL (ref 0.44–1.00)
GFR calc Af Amer: >60 mL/min (ref >60)
GFR calc non Af Amer: >60 mL/min (ref >60)
GLUCOSE: 95 mg/dL (ref 65–99)
POTASSIUM: 3.6 mmol/L (ref 3.5–5.1)
SODIUM: 139 mmol/L (ref 135–145)

## 2015-12-19 LAB — ECHOCARDIOGRAM COMPLETE
HEIGHTINCHES: 64 in
WEIGHTICAEL: 2666.68 [oz_av]

## 2015-12-19 LAB — TROPONIN I
Troponin I: 0.03 ng/mL (ref <0.03)
Troponin I: <0.03 ng/mL (ref <0.03)

## 2015-12-19 LAB — PROCALCITONIN: Procalcitonin: <0.1 ng/mL

## 2015-12-19 LAB — MAGNESIUM: Magnesium: 2.4 mg/dL (ref 1.7–2.4)

## 2015-12-19 LAB — MRSA PCR SCREENING: MRSA BY PCR: NEGATIVE

## 2015-12-19 LAB — ACETAMINOPHEN LEVEL

## 2015-12-19 LAB — LACTIC ACID, PLASMA: Lactic Acid, Venous: 0.8 mmol/L (ref 0.5–1.9)

## 2015-12-19 MED ORDER — ORAL CARE MOUTH RINSE: 15.0000 mL | Freq: Two times a day (BID) | OROMUCOSAL | Status: DC

## 2015-12-19 MED ORDER — SODIUM CHLORIDE 0.9% FLUSH
3.0000 mL | Freq: Two times a day (BID) | INTRAVENOUS | Status: DC
  Administered 2015-12-19 – 2015-12-20 (×3): 3 mL via INTRAVENOUS

## 2015-12-19 MED ORDER — SODIUM CHLORIDE 0.9% FLUSH
3.0000 mL | Freq: Two times a day (BID) | INTRAVENOUS | Status: DC
  Administered 2015-12-19 – 2015-12-20 (×4): 3 mL via INTRAVENOUS

## 2015-12-19 MED ORDER — MIDAZOLAM HCL 2 MG/2ML IJ SOLN: 2.0000 mg | INTRAMUSCULAR | Status: DC | PRN

## 2015-12-19 NOTE — Consult Note (Signed)
[facility] Consult   Reason for Consult:  Overdose. Referring Physician:  Dr. Ashby Dawes [name] MRN:  [id number] Principal Diagnosis: Bipolar 2 disorder, major depressive episode [facility]) Diagnosis:   Patient Active Problem List   Diagnosis Date Noted  . Bipolar 2 disorder, major depressive episode ([place]) [F31.81] 09/28/2015    Priority: High  . Suicidal ideation [R45.851] 12/18/2015  . Involuntary commitment [Z04.6] 12/18/2015  . Seizure ([place]) [R56.9] 12/18/2015  . Opiate overdose [T40.601A] 09/28/2015  . 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 Opioid use disorder, moderate, dependence ([place]) [F11.20] 09/28/2015  . 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 H/O eating disorder [Z86.59] 09/19/2013    Total Time spent with patient: 1 hour  Subjective:   Identifying data.Patricia [name] is a 55 y.o. female with a history of depression, mood instability, substance abuse and multiple suicide attempts.  Chief complaint. "Same thing happened."  History of present illness. Information was obtained from the patient and the chart. The patient has long history of mental illness. She is well known to our service and was admitted to psychiatry multiple times. She became acutely suicidal the day before admission and had an idea to crash the car to die. When she returned home she was arguing with her husband and overdosed on sleeping pills most likely Ambien prescribed for her husband and his narcotic painkillers. She was brought to the hospital and initially was feeling fully oriented and physically well. When she was brought to behavioral medicine she experienced seizure episode, was intubated to protect her airways, and was transferred to CCU. The patient is extubated and able to participate in the interview. She reports all symptoms of depression with poor sleep, decreased appetite, anhedonia, feeling of guilt and  hopelessness worthlessness, poor energy and concentration, crying spells, social isolation, heightened anxiety, and suicidal thinking. She denies psychotic symptoms or symptoms suggestive of bipolar mania. She denies alcohol or illicit drugs or prescription pill abuse but has a long history of abusing opiates along with other drugs. Her husband reported that his Ambien and narcotics were missing.  Past psychiatric history. She is has been struggling with depression since her teenage years. She had several psychiatric hospital overdose and cutting when younger. One of the overdose was severe enough to require respirator. She has narrowing of her trachea from it. She has been tried on multiple antidepressants. She has been recently tried on Lamictal, Saphris, Doxepin, Effexor, Prozac, Seroquel. She was hospitalized here in July and August. She has never been on antipsychotic. There is a history of alcoholism but she has been able to maintain sobriety for several years now.  Family psychiatric history. She denies any.  Social history. She lives with her husband, her son and his girlfriend. 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   Risk to Self: Is patient at risk for suicide?: Yes Risk to Others:   Prior Inpatient Therapy:   Prior Outpatient Therapy:    Past Medical History:  Past Medical History:  Diagnosis Date  . Alcohol abuse unk  . Anxiety unk  . Aspiration into airway   . Depression unk    Past Surgical History:  Procedure Laterality Date  . ABDOMINAL HYSTERECTOMY    . FOOT SURGERY    . TRACHEAL SURGERY     Family History:  Family History  Problem Relation Age of Onset  . Breast cancer Mother   . Hypertension Mother   . Depression Mother   .  Anxiety disorder Mother   . Alcohol abuse Father   . Alcohol abuse Sister   . Anxiety disorder Sister   . Bipolar disorder Sister    Social History:  History  Alcohol Use No    Comment: former alcoholic "sober for a month  now"     History  Drug Use No    Social History   Social History  . Marital status: Married    Spouse name: N/A  . Number of children: N/A  . Years of education: N/A   Social History Main Topics  . Smoking status: Former Smoker    Types: Cigarettes  . Smokeless tobacco: Never Used  . Alcohol use No     Comment: former alcoholic "sober for a month now"  . Drug use: No  . Sexual activity: Not Currently   Other Topics Concern  . None   Social History Narrative  . None   Additional Social History:    Allergies:   Allergies  Allergen Reactions  . Penicillins Hives    Has patient had a PCN reaction causing immediate rash, facial/tongue/throat swelling, SOB or lightheadedness with hypotension: Yes Has patient had a PCN reaction causing severe rash involving mucus membranes or skin necrosis: No Has patient had a PCN reaction that required hospitalization Yes Has patient had a PCN reaction occurring within the last 10 years: Yes If all of the above answers are "NO", then may proceed with Cephalosporin use.     Labs:  Results for orders placed or performed during the hospital encounter of 12/18/15 (from the past 48 hour(s))  Triglycerides     Status: None   Collection Time: 12/18/15  9:04 PM  Result Value Ref Range   Triglycerides 112 <150 mg/dL  Amylase     Status: Abnormal   Collection Time: 12/18/15  9:04 PM  Result Value Ref Range   Amylase 125 (H) 28 - 100 U/L  Lipase, blood     Status: None   Collection Time: 12/18/15  9:04 PM  Result Value Ref Range   Lipase 19 11 - 51 U/L  Procalcitonin - Baseline     Status: None   Collection Time: 12/18/15  9:04 PM  Result Value Ref Range   Procalcitonin <0.10 ng/mL    Comment:        Interpretation: PCT (Procalcitonin) <= 0.5 ng/mL: Systemic infection (sepsis) is not likely. Local bacterial infection is possible. (NOTE)         ICU PCT Algorithm               Non ICU PCT Algorithm    ----------------------------      ------------------------------         PCT < 0.25 ng/mL                 PCT < 0.1 ng/mL     Stopping of antibiotics            Stopping of antibiotics       strongly encouraged.               strongly encouraged.    ----------------------------     ------------------------------       PCT level decrease by               PCT < 0.25 ng/mL       >= 80% from peak PCT       OR PCT 0.25 - 0.5 ng/mL  Stopping of antibiotics                                             encouraged.     Stopping of antibiotics           encouraged.    ----------------------------     ------------------------------       PCT level decrease by              PCT >= 0.25 ng/mL       < 80% from peak PCT        AND PCT >= 0.5 ng/mL            Continuin g antibiotics                                              encouraged.       Continuing antibiotics            encouraged.    ----------------------------     ------------------------------     PCT level increase compared          PCT > 0.5 ng/mL         with peak PCT AND          PCT >= 0.5 ng/mL             Escalation of antibiotics                                          strongly encouraged.      Escalation of antibiotics        strongly encouraged.   Troponin I     Status: Abnormal   Collection Time: 12/18/15  9:04 PM  Result Value Ref Range   Troponin I 0.05 (HH) <0.03 ng/mL    Comment: CRITICAL RESULT CALLED TO, READ BACK BY AND VERIFIED WITH CHRISTINE KIM AT 2306 12/18/15.PMH  Blood gas, arterial     Status: Abnormal   Collection Time: 12/18/15  9:05 PM  Result Value Ref Range   FIO2 1.00    Delivery systems VENTILATOR    Mode ASSIST CONTROL    VT 500 mL   Peep/cpap 5.0 cm H20   pH, Arterial 7.36 7.350 - 7.450   pCO2 arterial 43 32.0 - 48.0 mmHg   pO2, Arterial 524 (H) 83.0 - 108.0 mmHg   Bicarbonate 24.3 20.0 - 28.0 mmol/L   Acid-base deficit 1.2 0.0 - 2.0 mmol/L   O2 Saturation 100.0 %   Patient temperature 37.0    Collection site LEFT  RADIAL    Sample type ARTERIAL DRAW    Allens test (pass/fail) PASS PASS   Mechanical Rate 18   Lactic acid, plasma     Status: None   Collection Time: 12/18/15  9:43 PM  Result Value Ref Range   Lactic Acid, Venous 1.6 0.5 - 1.9 mmol/L  Glucose, capillary     Status: Abnormal   Collection Time: 12/18/15 11:18 PM  Result Value Ref Range   Glucose-Capillary 113 (H) 65 - 99 mg/dL  MRSA PCR Screening     Status: None   Collection Time: 12/18/15 11:29 PM  Result Value Ref Range   MRSA by PCR NEGATIVE NEGATIVE    Comment:        The GeneXpert MRSA Assay (FDA approved for NASAL specimens only), is one component of a comprehensive MRSA colonization surveillance program. It is not intended to diagnose MRSA infection nor to guide or monitor treatment for MRSA infections.   Lactic acid, plasma     Status: None   Collection Time: 12/19/15 12:08 AM  Result Value Ref Range   Lactic Acid, Venous 0.8 0.5 - 1.9 mmol/L  CBC     Status: Abnormal   Collection Time: 12/19/15  4:44 AM  Result Value Ref Range   WBC 8.0 3.6 - 11.0 K/uL   RBC 3.49 (L) 3.80 - 5.20 MIL/uL   Hemoglobin 11.1 (L) 12.0 - 16.0 g/dL   HCT 32.1 (L) 35.0 - 47.0 %   MCV 92.0 80.0 - 100.0 fL   MCH 31.7 26.0 - 34.0 pg   MCHC 34.4 32.0 - 36.0 g/dL   RDW 14.3 11.5 - 14.5 %   Platelets 209 150 - 440 K/uL  Basic metabolic panel     Status: Abnormal   Collection Time: 12/19/15  4:44 AM  Result Value Ref Range   Sodium 139 135 - 145 mmol/L   Potassium 3.6 3.5 - 5.1 mmol/L   Chloride 105 101 - 111 mmol/L   CO2 23 22 - 32 mmol/L   Glucose, Bld 95 65 - 99 mg/dL   BUN 16 6 - 20 mg/dL   Creatinine, Ser 0.92 0.44 - 1.00 mg/dL   Calcium 8.0 (L) 8.9 - 10.3 mg/dL   GFR calc non Af Amer >60 >60 mL/min   GFR calc Af Amer >60 >60 mL/min    Comment: (NOTE) The eGFR has been calculated using the CKD EPI equation. This calculation has not been validated in all clinical situations. eGFR's persistently <60 mL/min signify possible  Chronic Kidney Disease.    Anion gap 11 5 - 15  Magnesium     Status: None   Collection Time: 12/19/15  4:44 AM  Result Value Ref Range   Magnesium 2.4 1.7 - 2.4 mg/dL  Phosphorus     Status: None   Collection Time: 12/19/15  4:44 AM  Result Value Ref Range   Phosphorus 3.9 2.5 - 4.6 mg/dL  Procalcitonin     Status: None   Collection Time: 12/19/15  4:44 AM  Result Value Ref Range   Procalcitonin <0.10 ng/mL    Comment:        Interpretation: PCT (Procalcitonin) <= 0.5 ng/mL: Systemic infection (sepsis) is not likely. Local bacterial infection is possible. (NOTE)         ICU PCT Algorithm               Non ICU PCT Algorithm    ----------------------------     ------------------------------         PCT < 0.25 ng/mL                 PCT < 0.1 ng/mL     Stopping of antibiotics            Stopping of antibiotics       strongly encouraged.               strongly encouraged.    ----------------------------     ------------------------------       PCT level decrease by  PCT < 0.25 ng/mL       >= 80% from peak PCT       OR PCT 0.25 - 0.5 ng/mL          Stopping of antibiotics                                             encouraged.     Stopping of antibiotics           encouraged.    ----------------------------     ------------------------------       PCT level decrease by              PCT >= 0.25 ng/mL       < 80% from peak PCT        AND PCT >= 0.5 ng/mL            Continuin g antibiotics                                              encouraged.       Continuing antibiotics            encouraged.    ----------------------------     ------------------------------     PCT level increase compared          PCT > 0.5 ng/mL         with peak PCT AND          PCT >= 0.5 ng/mL             Escalation of antibiotics                                          strongly encouraged.      Escalation of antibiotics        strongly encouraged.   Troponin I     Status: Abnormal    Collection Time: 12/19/15  4:44 AM  Result Value Ref Range   Troponin I 0.03 (HH) <0.03 ng/mL    Comment: CRITICAL VALUE NOTED. VALUE IS CONSISTENT WITH PREVIOUSLY REPORTED/CALLED VALUE.PMH  Troponin I     Status: None   Collection Time: 12/19/15  9:36 AM  Result Value Ref Range   Troponin I <0.03 <0.03 ng/mL  Acetaminophen level     Status: Abnormal   Collection Time: 12/19/15  9:43 AM  Result Value Ref Range   Acetaminophen (Tylenol), Serum <10 (L) 10 - 30 ug/mL    Comment:        THERAPEUTIC CONCENTRATIONS VARY SIGNIFICANTLY. A RANGE OF 10-30 ug/mL MAY BE AN EFFECTIVE CONCENTRATION FOR MANY PATIENTS. HOWEVER, SOME ARE BEST TREATED AT CONCENTRATIONS OUTSIDE THIS RANGE. ACETAMINOPHEN CONCENTRATIONS >150 ug/mL AT 4 HOURS AFTER INGESTION AND >50 ug/mL AT 12 HOURS AFTER INGESTION ARE OFTEN ASSOCIATED WITH TOXIC REACTIONS.     Current Facility-Administered Medications  Medication Dose Route Frequency Provider Last Rate Last Dose  . 0.9 %  sodium chloride infusion  250 mL Intravenous PRN Lewie Loron, NP 10 mL/hr at 12/19/15 1500 250 mL at 12/19/15 1500  . acetaminophen (TYLENOL) tablet 650 mg  650 mg Oral Q6H PRN Lewie Loron,  NP      . atorvastatin (LIPITOR) tablet 20 mg  20 mg Per NG tube QHS Magadalene Abran Duke, NP      . bisacodyl (DULCOLAX) suppository 10 mg  10 mg Rectal Daily PRN Mikael Spray, NP      . chlorhexidine gluconate (MEDLINE KIT) (PERIDEX) 0.12 % solution 15 mL  15 mL Mouth Rinse BID Mikael Spray, NP   15 mL at 12/19/15 0748  . famotidine (PEPCID) IVPB 20 mg premix  20 mg Intravenous Q12H Mikael Spray, NP   20 mg at 12/19/15 1009  . folic acid injection 1 mg  1 mg Intravenous Daily Mikael Spray, NP   1 mg at 12/19/15 1009  . heparin injection 5,000 Units  5,000 Units Subcutaneous Q8H Mikael Spray, NP   5,000 Units at 12/19/15 1334  . ipratropium-albuterol (DUONEB) 0.5-2.5 (3) MG/3ML nebulizer solution 3 mL  3 mL  Nebulization Q6H PRN Mikael Spray, NP      . lamoTRIgine (LAMICTAL) tablet 200 mg  200 mg Per NG tube Daily Mikael Spray, NP      . levETIRAcetam (KEPPRA) 500 mg in sodium chloride 0.9 % 100 mL IVPB  500 mg Intravenous Q12H Mikael Spray, NP   500 mg at 12/19/15 1300  . MEDLINE mouth rinse  15 mL Mouth Rinse QID Mikael Spray, NP   15 mL at 12/19/15 1229  . midazolam (VERSED) injection 2 mg  2 mg Intravenous Q15 min PRN Mikael Spray, NP      . midazolam (VERSED) injection 2-4 mg  2-4 mg Intravenous Q2H PRN Mikael Spray, NP      . ondansetron (ZOFRAN) injection 4 mg  4 mg Intravenous Q6H PRN Mikael Spray, NP      . sennosides (SENOKOT) 8.8 MG/5ML syrup 5 mL  5 mL Per Tube BID PRN Mikael Spray, NP      . sodium chloride flush (NS) 0.9 % injection 3 mL  3 mL Intravenous Q12H Mikael Spray, NP   3 mL at 12/19/15 0830  . sodium chloride flush (NS) 0.9 % injection 3 mL  3 mL Intravenous Q12H Mikael Spray, NP   3 mL at 12/19/15 0300  . sodium chloride flush (NS) 0.9 % injection 3 mL  3 mL Intravenous Q12H Mikael Spray, NP   3 mL at 12/19/15 0750  . thiamine (B-1) injection 100 mg  100 mg Intravenous Daily Mikael Spray, NP   100 mg at 12/19/15 1008    Musculoskeletal: Strength & Muscle Tone: within normal limits Gait & Station: normal Patient leans: N/A  Psychiatric Specialty Exam: Physical Exam  Nursing note and vitals reviewed.   Review of Systems  Psychiatric/Behavioral: Positive for depression, substance abuse and suicidal ideas.  All other systems reviewed and are negative.   Blood pressure 112/68, pulse (!) 101, temperature 100.1 F (37.8 C), temperature source Oral, resp. rate 14, height '5\' 4"'$  (1.626 m), weight 75.6 kg (166 lb 10.7 oz), SpO2 99 %.Body mass index is 28.61 kg/m.  General Appearance: Casual  Eye Contact:  Good  Speech:  Clear and Coherent  Volume:  Normal  Mood:  Depressed, Hopeless and Worthless   Affect:  Blunt  Thought Process:  Goal Directed  Orientation:  Full (Time, Place, and Person)  Thought Content:  WDL  Suicidal Thoughts:  Yes.  with intent/plan  Homicidal Thoughts:  No  Memory:  Immediate;   Fair  Recent;   Fair Remote;   Fair  Judgement:  Poor  Insight:  Lacking  Psychomotor Activity:  Normal  Concentration:  Concentration: Fair and Attention Span: Fair  Recall:  [employer] of Knowledge:  Fair  Language:  Fair  Akathisia:  No  Handed:  Right  AIMS (if indicated):     Assets:  Communication Skills Desire for Improvement Housing Physical Health Resilience Social Support  ADL's:  Intact  Cognition:  WNL  Sleep:        Treatment Plan Summary: Daily contact with patient to assess and evaluate symptoms and progress in treatment and Medication management   Ms. Osterhout has a history of depression, mood instability, substance abuse and multiple serious suicide attempt admitted after yet another attempt by narcotic and sleeping pill overdose.  1. She meets criteria for psychiatric admission when medically stable.  2. Please continue Lamictal.  3. Please hold other psychotropic medications.  4. I will follow along tomorrow.     Disposition: Recommend psychiatric Inpatient admission when medically cleared. Supportive therapy provided about ongoing stressors. Discussed crisis plan, support from social network, calling 911, coming to the Emergency Department, and calling Suicide Hotline.  Orson Slick, MD 12/19/2015 4:30 PM

## 2015-12-19 NOTE — Progress Notes (Signed)
Patient extubated around 9:45am, no eye contact, follows basic commands, nonverbal initially, now making incomprehensible sounds. Immediately after extubation patients head turned quickly to right, tongue and hands twitching, patient did not respond to verbal/tactile stimuli during this time. Lasted approximately 1 minute.

## 2015-12-19 NOTE — Progress Notes (Addendum)
PULMONARY / CRITICAL CARE MEDICINE   Name: Patricia [name] MRN: [id number] DOB: 1960-10-15    ADMISSION DATE:  12/18/2015   ADDENDUM:   Patient successfully extubated this am without incident. Soon after, the patient was noted to be minimally responsive with head turned to right side, there was twitching movements of the arms and legs, greatest in right arm. This last for 30-45 seconds, then the patient awoke and became responsive.   -DR.  10 AM.   CHIEF COMPLAINT: Unresponsive  HISTORY OF PRESENT ILLNESS:   This is a 55 y/o female with a PMH of polysubstance abuse, depression, anxiety, bipolar disorder and previous tracheostomy who initially presented with possible overdose on multiple medications and suicide attempt. History is obtained from ED and EMS notes. EMS was initially called because patient took an unknown number of Seroquel and two pain pills after getting into an argument with her husband. EMS found missing Ambien, metoprolol and ASA pills as well. She was given narcan with mild improvement in mental status. At the ED, patient indicated that she wanted to die. She was placed on suicide watch and monitored in the ED for hours. She was medically cleared and transferred to psyche. However, shortly after being in psyche, patient developed a grand mal seizure, became unresponse and was emergently intubated for airway protection. PCCM was asked to admit. Unclear how long seizure activity lasted. No further seizure activity post intubation. No prior h/o seizures Per patient's husband, he is not able to find his Ambien and Norco medication bottles. He states that patient had a similar suicide attempt before in which she took his Norco and Ambien and also hide the Narcan that they have at home, so he won't be able to give it to her.  SUBJECTIVE: Mildly hypotensive overnight. No other issues.   VITAL SIGNS: BP (!) 93/56   Pulse 96   Temp 99.9 F (37.7 C) (Oral)   Resp 15   Ht 5\' 4"   (1.626 m)   Wt 166 lb 10.7 oz (75.6 kg)   SpO2 100%   BMI 28.61 kg/m   HEMODYNAMICS:    VENTILATOR SETTINGS: Vent Mode: PRVC FiO2 (%):  [28 %-100 %] 28 % Set Rate:  [18 bmp] 18 bmp Vt Set:  [500 mL] 500 mL PEEP:  [5 cmH20] 5 cmH20 Plateau Pressure:  [12 cmH20] 12 cmH20  INTAKE / OUTPUT: No intake/output data recorded.  PHYSICAL EXAMINATION: General: Disheveled, appropriate for age Neuro: Withdraws to noxious stimulus, sedated HEENT: Head is normocephalic and nontraumatic. Oral mucosa moist, oral cavity with minimal secretions, trachea midline, well-healed tracheostomy scar Cardiovascular:  Mildly tachycardic, S1, S2, no murmur, regurg or gallop Lungs: Clear to auscultation bilaterally without any wheezes or rhonchi Abdomen: Nondistended, normal bowel sounds, palpation reveals no organomegaly Musculoskeletal: No visible deformities Extremities: +2 pulses bilaterally, no edema Skin: Warm, dry with no rash  LABS:  BMET  Recent Labs Lab 12/18/15 0927 12/19/15 0444  NA 138 139  K 4.0 3.6  CL 100* 105  CO2 26 23  BUN 15 16  CREATININE 0.94 0.92  GLUCOSE 114* 95    Electrolytes  Recent Labs Lab 12/18/15 0927 12/19/15 0444  CALCIUM 9.5 8.0*  MG  --  2.4  PHOS  --  3.9    CBC  Recent Labs Lab 12/18/15 0927 12/19/15 0444  WBC 9.6 8.0  HGB 12.9 11.1*  HCT 37.5 32.1*  PLT 235 209    Coag's No results for input(s): APTT, INR in the  last 168 hours.  Sepsis Markers  Recent Labs Lab 12/18/15 2104 12/18/15 2143 12/19/15 0008 12/19/15 0444  LATICACIDVEN  --  1.6 0.8  --   PROCALCITON <0.10  --   --  <0.10    ABG  Recent Labs Lab 12/18/15 2105  PHART 7.36  PCO2ART 43  PO2ART 524*    Liver Enzymes  Recent Labs Lab 12/18/15 0927  AST 86*  ALT 34  ALKPHOS 105  BILITOT 0.6  ALBUMIN 4.3    Cardiac Enzymes  Recent Labs Lab 12/18/15 2104 12/19/15 0444  TROPONINI 0.05* 0.03*    Glucose  Recent Labs Lab 12/18/15 0934  12/18/15 2318  GLUCAP 116* 113*    Imaging Dg Abd 1 View  Result Date: 12/19/2015 CLINICAL DATA:  Orogastric tube placement.  Initial encounter. EXAM: ABDOMEN - 1 VIEW COMPARISON:  CT of the abdomen and pelvis performed 08/29/2014 FINDINGS: The patient's enteric tube is noted ending overlying the antrum of the stomach. The visualized bowel gas pattern is unremarkable. Scattered air and stool filled loops of colon are seen; no abnormal dilatation of small bowel loops is seen to suggest small bowel obstruction. No free intra-abdominal air is identified, though evaluation for free air is limited on a single supine view. The visualized osseous structures are within normal limits; the sacroiliac joints are unremarkable in appearance. The visualized lung bases are essentially clear. IMPRESSION: 1. Enteric tube noted ending overlying the antrum of the stomach. 2. Unremarkable bowel gas pattern; no free intra-abdominal air seen. Moderate amount of stool noted in the colon. Electronically Signed   By: Roanna RaiderJeffery  Chang M.D.   On: 12/19/2015 01:55   Ct Head Wo Contrast  Result Date: 12/18/2015 CLINICAL DATA:  55 year old female with seizures and unresponsiveness EXAM: CT HEAD WITHOUT CONTRAST TECHNIQUE: Contiguous axial images were obtained from the base of the skull through the vertex without intravenous contrast. COMPARISON:  Head CT dated 02/06/2015 FINDINGS: Brain: No evidence of acute infarction, hemorrhage, hydrocephalus, extra-axial collection or mass lesion/mass effect. Vascular: No hyperdense vessel or unexpected calcification. Skull: Normal. Negative for fracture or focal lesion. Sinuses/Orbits: No acute finding. Other: None IMPRESSION: No acute intracranial pathology. Electronically Signed   By: Elgie CollardArash  Radparvar M.D.   On: 12/18/2015 22:51   Dg Chest Port 1 View  Result Date: 12/19/2015 CLINICAL DATA:  Endotracheal tube placement.  Initial encounter. EXAM: PORTABLE CHEST 1 VIEW COMPARISON:  Chest  radiograph performed earlier today at 9:07 p.m. FINDINGS: The patient's endotracheal tube is seen ending 3-4 cm above the carina. An enteric tube is noted extending below the diaphragm. The lungs are well-aerated. Mild vascular congestion is noted. There is no evidence of focal opacification, pleural effusion or pneumothorax. The cardiomediastinal silhouette is borderline normal in size. No acute osseous abnormalities are seen. IMPRESSION: 1. Endotracheal tube seen ending 3-4 cm above the carina. 2. Mild vascular congestion noted.  Lungs remain grossly clear. Electronically Signed   By: Roanna RaiderJeffery  Chang M.D.   On: 12/19/2015 01:56   Dg Chest Portable 1 View  Result Date: 12/18/2015 CLINICAL DATA:  Endotracheal placement. EXAM: PORTABLE CHEST 1 VIEW COMPARISON:  10/23/2015 FINDINGS: Endotracheal tip is 5.5 cm proximal to the carina. Heart size is normal. Mediastinal shadows are normal. There may be mild interstitial edema and minimal basilar atelectasis. Chronic granuloma right upper lobe as seen previously. No acute bone finding. IMPRESSION: Endotracheal tube tip 5.5 cm above the carina. Probable mild interstitial edema. Mild basilar atelectasis. Electronically Signed   By: Paulina FusiMark  Shogry  M.D.   On: 12/18/2015 21:43     STUDIES:  2-D echo ordered  CULTURES: None  ANTIBIOTICS: None  SIGNIFICANT EVENTS: 12/18/2015: ED with overdose, stabilized and transferred to psych; had a grand mal seizure, became obtunded and emergently intubated  LINES/TUBES: ET tube placed 12/18/2015 Peripheral IVs  DISCUSSION: 55 year old Caucasian female presenting with an overdose on multiple medications, suicide attempt, new onset seizures and respiratory failure secondary to altered mental status  ASSESSMENT / PLAN:  PULMONARY A: Acute respiratory failure secondary to altered mental status/SEIZURE activity Mild pulmonary edema on chest x-ray P:   Full vent support with current settings. VAP  protocol Nebulized bronchodilators as needed. Chest x-ray and ABG daily as needed. Monitor fluid status closely; no need for diuresis at this time  CARDIOVASCULAR A:  Sinus tachycardia Mild hypotension-likely due to sedating medications P:  Hemodynamic monitoring per ICU protocol Maintain MAP greater than or equal 65 When necessary boluses for map less than 65 Cycle troponin 2-D echo pending  RENAL A:   No acute issues P:   Trend creatinine Monitor intake and output  GASTROINTESTINAL A:   No acute issues P:   Pepcid for GI prophylaxis  HEMATOLOGIC A:   Mild anemia-baseline hemoglobin 13.7; now down to 11.1 P:  Heparin for VTE prophylaxis Trend CBC  INFECTIOUS A:   No acute issues P:   Monitor WBC and fever curve  ENDOCRINE A:   No acute issues   P:   Monitor blood glucose with BMP  NEUROLOGIC A:   New onset seizures-exact etiology unclear but likely due to medication side effects. Patient is on doxepin which has seizures as a side effect and supposedly overdosed on multiple medications. She was also given Narcan that also has seizures as a side effect. Her CT head was negative for any abnormalities and she has no prior history of seizures. From patient's account, she hasn't had any alcohol in the month and her blood alcohol level upon ED presentation was negative. Her spouse confirmed this. This is probably a medication-induced seizure rather than alcohol withdrawal seizure P:   RASS goal: -1 to -2 Seizure precautions Keppra load given Continue Keppra 500 mg IV twice a day EEG Neurology consult 2-D echo Propofol and Versed for vent sedation/comfort  Psychiatry A: Suicide attempt. History of bipolar disorder. History of depression and anxiety P: Suicide precautions Continue Lamictal 200 mg daily and hold all other antipsychotic medications. Psych consult-psych case already following  Disposition and family update: Husband updated. Please notify  with any changes  Total critical care time=35 minutes  [facility] ANP-BC Pulmonary and Critical Care Medicine [facility] Pager [phone] or [phone] 12/19/2015, 6:55 AM   Please see my note in H&P.  Wells Guiles, M.D.  12/19/2015

## 2015-12-19 NOTE — Progress Notes (Signed)
Pharmacy note Antibiotic renal dosing:  Patient is not currently on any antibiotics that require renal adjustment. Pharmacy will follow and manage as appropriate.

## 2015-12-19 NOTE — Consult Note (Signed)
Reason for Consult:seizures Referring Physician: Dr. Nicholos Johnsamachandran   CC: depression/overdose.   HPI: Patricia [name] is an 55 y.o. female with a PMH of polysubstance abuse, depression, anxiety, bipolar disorder and previous tracheostomy who initially presented with possible overdose on multiple medications and suicide attempt. Pt is s/p extubation this AM and is following commands.  Pt had episodes of unresponsiveness with ? Seizure activity now following commands.   Past Medical History:  Diagnosis Date  . Alcohol abuse unk  . Anxiety unk  . Aspiration into airway   . Depression unk    Past Surgical History:  Procedure Laterality Date  . ABDOMINAL HYSTERECTOMY    . FOOT SURGERY    . TRACHEAL SURGERY      Family History  Problem Relation Age of Onset  . Breast cancer Mother   . Hypertension Mother   . Depression Mother   . Anxiety disorder Mother   . Alcohol abuse Father   . Alcohol abuse Sister   . Anxiety disorder Sister   . Bipolar disorder Sister     Social History:  reports that she has quit smoking. Her smoking use included Cigarettes. She has never used smokeless tobacco. She reports that she does not drink alcohol or use drugs.  Allergies  Allergen Reactions  . Penicillins Hives    Has patient had a PCN reaction causing immediate rash, facial/tongue/throat swelling, SOB or lightheadedness with hypotension: Yes Has patient had a PCN reaction causing severe rash involving mucus membranes or skin necrosis: No Has patient had a PCN reaction that required hospitalization Yes Has patient had a PCN reaction occurring within the last 10 years: Yes If all of the above answers are "NO", then may proceed with Cephalosporin use.     Medications: I have reviewed the patient's current medications.  ROS: Unable  To provide history   Physical Examination: Blood pressure 112/68, pulse (!) 101, temperature 100.1 F (37.8 C), temperature source Oral, resp. rate 14, height 5'  4" (1.626 m), weight 75.6 kg (166 lb 10.7 oz), SpO2 99 %.    Neurological Examination Mental Status: Alert to name  Cranial Nerves: II: Discs flat bilaterally; Visual fields grossly normal, pupils equal, round, reactive to light and accommodation III,IV, VI: ptosis not present, extra-ocular motions intact bilaterally V,VII: smile symmetric, facial light touch sensation normal bilaterally VIII: hearing normal bilaterally IX,X: gag reflex present XI: bilateral shoulder shrug XII: midline tongue extension Motor: Right : Upper extremity   5/5    Left:     Upper extremity   5/5  Lower extremity   5/5     Lower extremity   5/5 Tone and bulk:normal tone throughout; no atrophy noted Sensory: Pinprick and light touch intact throughout, bilaterally Deep Tendon Reflexes: 1+ and symmetric throughout Plantars: Right: downgoing   Left: downgoing Cerebellar: normal finger-to-nose, normal rapid alternating movements and normal heel-to-shin test Gait: normal gait and station      Laboratory Studies:   Basic Metabolic Panel:  Recent Labs Lab 12/18/15 0927 12/19/15 0444  NA 138 139  K 4.0 3.6  CL 100* 105  CO2 26 23  GLUCOSE 114* 95  BUN 15 16  CREATININE 0.94 0.92  CALCIUM 9.5 8.0*  MG  --  2.4  PHOS  --  3.9    Liver Function Tests:  Recent Labs Lab 12/18/15 0927  AST 86*  ALT 34  ALKPHOS 105  BILITOT 0.6  PROT 7.7  ALBUMIN 4.3    Recent Labs Lab 12/18/15  2104  LIPASE 19  AMYLASE 125*   No results for input(s): AMMONIA in the last 168 hours.  CBC:  Recent Labs Lab 12/18/15 0927 12/19/15 0444  WBC 9.6 8.0  HGB 12.9 11.1*  HCT 37.5 32.1*  MCV 92.5 92.0  PLT 235 209    Cardiac Enzymes:  Recent Labs Lab 12/18/15 2104 12/19/15 0444 12/19/15 0936  TROPONINI 0.05* 0.03* <0.03    BNP: Invalid input(s): POCBNP  CBG:  Recent Labs Lab 12/18/15 0934 12/18/15 2318  GLUCAP 116* 113*    Microbiology: Results for orders placed or performed  during the hospital encounter of 12/18/15  MRSA PCR Screening     Status: None   Collection Time: 12/18/15 11:29 PM  Result Value Ref Range Status   MRSA by PCR NEGATIVE NEGATIVE Final    Comment:        The GeneXpert MRSA Assay (FDA approved for NASAL specimens only), is one component of a comprehensive MRSA colonization surveillance program. It is not intended to diagnose MRSA infection nor to guide or monitor treatment for MRSA infections.     Coagulation Studies: No results for input(s): LABPROT, INR in the last 72 hours.  Urinalysis: No results for input(s): COLORURINE, LABSPEC, PHURINE, GLUCOSEU, HGBUR, BILIRUBINUR, KETONESUR, PROTEINUR, UROBILINOGEN, NITRITE, LEUKOCYTESUR in the last 168 hours.  Invalid input(s): APPERANCEUR  Lipid Panel:     Component Value Date/Time   CHOL 198 10/23/2015 0429   TRIG 112 12/18/2015 2104   TRIG 197 08/16/2013 0410   HDL 85 10/23/2015 0429   CHOLHDL 2.3 10/23/2015 0429   VLDL 14 10/23/2015 0429   LDLCALC 99 10/23/2015 0429    HgbA1C:  Lab Results  Component Value Date   HGBA1C 4.9 10/23/2015    Urine Drug Screen:     Component Value Date/Time   LABOPIA POSITIVE (A) 12/18/2015 1045   COCAINSCRNUR NONE DETECTED 12/18/2015 1045   LABBENZ NONE DETECTED 12/18/2015 1045   AMPHETMU NONE DETECTED 12/18/2015 1045   THCU NONE DETECTED 12/18/2015 1045   LABBARB NONE DETECTED 12/18/2015 1045    Alcohol Level:  Recent Labs Lab 12/18/15 0927  ETH <5    Other results: EKG: normal EKG, normal sinus rhythm, unchanged from previous tracings.  Imaging: Dg Abd 1 View  Result Date: 12/19/2015 CLINICAL DATA:  Orogastric tube placement.  Initial encounter. EXAM: ABDOMEN - 1 VIEW COMPARISON:  CT of the abdomen and pelvis performed 08/29/2014 FINDINGS: The patient's enteric tube is noted ending overlying the antrum of the stomach. The visualized bowel gas pattern is unremarkable. Scattered air and stool filled loops of colon are seen;  no abnormal dilatation of small bowel loops is seen to suggest small bowel obstruction. No free intra-abdominal air is identified, though evaluation for free air is limited on a single supine view. The visualized osseous structures are within normal limits; the sacroiliac joints are unremarkable in appearance. The visualized lung bases are essentially clear. IMPRESSION: 1. Enteric tube noted ending overlying the antrum of the stomach. 2. Unremarkable bowel gas pattern; no free intra-abdominal air seen. Moderate amount of stool noted in the colon. Electronically Signed   By: Roanna Raider M.D.   On: 12/19/2015 01:55   Ct Head Wo Contrast  Result Date: 12/18/2015 CLINICAL DATA:  55 year old female with seizures and unresponsiveness EXAM: CT HEAD WITHOUT CONTRAST TECHNIQUE: Contiguous axial images were obtained from the base of the skull through the vertex without intravenous contrast. COMPARISON:  Head CT dated 02/06/2015 FINDINGS: Brain: No evidence of acute infarction,  hemorrhage, hydrocephalus, extra-axial collection or mass lesion/mass effect. Vascular: No hyperdense vessel or unexpected calcification. Skull: Normal. Negative for fracture or focal lesion. Sinuses/Orbits: No acute finding. Other: None IMPRESSION: No acute intracranial pathology. Electronically Signed   By: Elgie Collard M.D.   On: 12/18/2015 22:51   Dg Chest Port 1 View  Result Date: 12/19/2015 CLINICAL DATA:  55 year old female with acute respiratory failure EXAM: PORTABLE CHEST 1 VIEW COMPARISON:  Chest radiograph dated 12/18/2015 FINDINGS: Endotracheal tube with tip above the carina in stable positioning. An enteric tube courses into the upper abdomen with tip beyond the inferior margin of the image. The lungs are clear. There is no pleural effusion or pneumothorax. The cardiac silhouette is within normal limits. No acute osseous pathology. IMPRESSION: No acute cardiopulmonary process. Support device in stable positioning.  Electronically Signed   By: Elgie Collard M.D.   On: 12/19/2015 07:05   Dg Chest Port 1 View  Result Date: 12/19/2015 CLINICAL DATA:  Endotracheal tube placement.  Initial encounter. EXAM: PORTABLE CHEST 1 VIEW COMPARISON:  Chest radiograph performed earlier today at 9:07 p.m. FINDINGS: The patient's endotracheal tube is seen ending 3-4 cm above the carina. An enteric tube is noted extending below the diaphragm. The lungs are well-aerated. Mild vascular congestion is noted. There is no evidence of focal opacification, pleural effusion or pneumothorax. The cardiomediastinal silhouette is borderline normal in size. No acute osseous abnormalities are seen. IMPRESSION: 1. Endotracheal tube seen ending 3-4 cm above the carina. 2. Mild vascular congestion noted.  Lungs remain grossly clear. Electronically Signed   By: Roanna Raider M.D.   On: 12/19/2015 01:56   Dg Chest Portable 1 View  Result Date: 12/18/2015 CLINICAL DATA:  Endotracheal placement. EXAM: PORTABLE CHEST 1 VIEW COMPARISON:  10/23/2015 FINDINGS: Endotracheal tip is 5.5 cm proximal to the carina. Heart size is normal. Mediastinal shadows are normal. There may be mild interstitial edema and minimal basilar atelectasis. Chronic granuloma right upper lobe as seen previously. No acute bone finding. IMPRESSION: Endotracheal tube tip 5.5 cm above the carina. Probable mild interstitial edema. Mild basilar atelectasis. Electronically Signed   By: Paulina Fusi M.D.   On: 12/18/2015 21:43     Assessment/Plan:  56 y.o. female with a PMH of polysubstance abuse, depression, anxiety, bipolar disorder and previous tracheostomy who initially presented with possible overdose on multiple medications and suicide attempt. Pt is s/p extubation this AM and is following commands.  Pt had episodes of unresponsiveness with ? Seizure activity now following commands.   Pt is awake, confused/disoriented but follows commands No history of seizure disorder but the  medications in her system which she took could be lowering her seizure threshold Would consider VPA 500 BID for mood stabilization and seizure but would like to await until psychiatric evaluation Will follow.  Pauletta Browns  12/19/2015, 4:36 PM

## 2015-12-19 NOTE — Progress Notes (Signed)
[facility]; Baxter HireKristen, RN [facility] called and updated patient's husband-Karl-about patient's condition and transfer to unit earlier during the shift.

## 2015-12-19 NOTE — ED Provider Notes (Signed)
[facility] Emergency Department Provider Note ____________________________________________   First MD Initiated Contact with Patient 12/18/15 2100   (approximate)  I have reviewed the triage vital signs and the nursing notes.   HISTORY  Chief Complaint Drug Overdose  LEVEL 5 CAVEAT - PATIENT UNRESPONSIVE AND CANNOT PROVIDE HISTORY  HPI Patricia [name] is a 55 y.o. female seen earlier today in this emergency department for an intentional drug overdose of multiple substances.  She was medically stable, conversant,ambulated to the bathroom without difficulty, and was taken down stairs to behavioral medicine for further treatment.  When she got down to behavioral medicine the ED tech that had been with her noted that she seemed somewhat more confused that she had been.  Over a relatively rapid period of time she became less and less responsive, vomited, and began having seizure-like activity.  A rapid response was called and the [place] unit nurse called to my charge nurse and asked that we come down to evaluate the patient.  We ran down to the behavioral unit and I found that she was unresponsive sitting up in a wheelchair, vomiting over the left side and having seizure-like activity.  She did not respond to noxious stimuli, had no corneal reflex when I touch her eyelids, and her eyes rolled back in her head.  We used a Yankauer to suction her airway and she had a very minimal reaction, not a full gag reflex.  I made the decision that it would be safest for her to transport her emergently back to the emergency department rather than try to protect her airway in the behavioral unit.  See below for further details.       Past Medical History:  Diagnosis Date  . Alcohol abuse unk  . Anxiety unk  . Aspiration into airway   . Depression unk        Patient Active Problem List   Diagnosis Date Noted  . Suicidal ideation 12/18/2015  . Involuntary  commitment 12/18/2015  . Bipolar 2 disorder (HCC) 12/18/2015  . Seizure (HCC) 12/18/2015  . Opiate overdose 09/28/2015  . Stimulant use disorder 09/28/2015  . Bipolar 2 disorder, major depressive episode (HCC) 09/28/2015  . Opioid use disorder, moderate, dependence (HCC) 09/28/2015  . 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 H/O eating disorder 09/19/2013         Past Surgical History:  Procedure Laterality Date  . ABDOMINAL HYSTERECTOMY    . FOOT SURGERY    . TRACHEAL SURGERY             Prior to Admission medications   Medication Sig Start Date End Date Taking? Authorizing Provider  asenapine (SAPHRIS) 5 MG SUBL 24 hr tablet Place 5 mg under the tongue daily. nightly   Yes Historical Provider, MD  atorvastatin (LIPITOR) 20 MG tablet Take 20 mg by mouth at bedtime.   Yes Historical Provider, MD  doxepin (SINEQUAN) 100 MG capsule Take 1 capsule (100 mg total) by mouth at bedtime. 10/29/15  Yes Jolanta B Pucilowska, MD  folic acid (FOLVITE) 1 MG tablet Take 1 mg by mouth daily.   Yes Historical Provider, MD  lamoTRIgine (LAMICTAL) 200 MG tablet Take 1 tablet (200 mg total) by mouth daily. 10/29/15  Yes Jolanta B Pucilowska, MD  QUEtiapine (SEROQUEL) 300 MG tablet Take 1 tablet (300 mg total) by mouth at bedtime. 10/29/15  Yes Jolanta B Pucilowska, MD  venlafaxine XR (EFFEXOR-XR) 150 MG 24 hr capsule Take 1 capsule (  150 mg total) by mouth daily with breakfast. 10/29/15  Yes Jolanta B Pucilowska, MD  naloxone HCl 4 MG/0.1ML LIQD administer in case of opioid overdose. 10/29/15   Shari Prows, MD    Allergies Penicillins       Family History  Problem Relation Age of Onset  . Breast cancer Mother   . Hypertension Mother   . Depression Mother   . Anxiety disorder Mother   . Alcohol abuse Father   . Alcohol abuse Sister   . Anxiety disorder Sister   . Bipolar disorder Sister     Social History       Social History    Substance Use Topics  . Smoking status: Former Smoker    Types: Cigarettes  . Smokeless tobacco: Never Used  . Alcohol use No     Comment: former alcoholic "sober for a month now"    Review of Systems  Level 5 caveat-patient unresponsive and critically ill ____________________________________________   PHYSICAL EXAM:  VITAL SIGNS:    ED Triage Vitals [12/18/15 0936]  Enc Vitals Group     BP 124/63     Pulse Rate (!) 119     Resp 20     Temp 97.3 F (36.3 C)     Temp Source Oral     SpO2 95 %     Weight 150 lb (68 kg)     Height      Head Circumference      Peak Flow      Pain Score      Pain Loc      Pain Edu?      Excl. in GC?     Constitutional: Patient appears acutely ill, unresponsive, having seizure-like activity, vomiting, not protecting airway Eyes: Eyes rolled up superiorly, I cannot appreciate her pupillary exam Head: Atraumatic. Nose: No congestion/rhinnorhea. Mouth/Throat: Mucous membranes are moist.  Emesis present in mouth Cardiovascular: Normal rate, regular rhythm. Good peripheral circulation. Grossly normal heart sounds. Respiratory: Decreased respiratory rate but still breathing spontaneously Gastrointestinal: Soft and nontender. No distention.  Musculoskeletal: No lower extremity tenderness nor edema. No gross deformities of extremities. Neurologic:  GCS 5, patient seems to be seizing, no corneal reflex, minimal gag reflex, not protecting airway Skin:  Skin is warm, diaphoretic, and intact. No rash noted.   ____________________________________________   LABS (all labs ordered are listed, but only abnormal results are displayed)       Labs Reviewed  COMPREHENSIVE METABOLIC PANEL - Abnormal; Notable for the following:       Result Value    Chloride 100 (*)    Glucose, Bld 114 (*)    AST 86 (*)    All other components within normal limits  ACETAMINOPHEN LEVEL - Abnormal; Notable for the following:     Acetaminophen (Tylenol), Serum <10 (*)    All other components within normal limits  URINE DRUG SCREEN, QUALITATIVE ([facility] ONLY) - Abnormal; Notable for the following:    Tricyclic, Ur Screen POSITIVE (*)    Opiate, Ur Screen POSITIVE (*)    All other components within normal limits  GLUCOSE, CAPILLARY - Abnormal; Notable for the following:    Glucose-Capillary 116 (*)    All other components within normal limits  ETHANOL  SALICYLATE LEVEL  CBC   ____________________________________________  EKG  ED ECG REPORT I, Asalee Barrette, the attending physician, personally viewed and interpreted this ECG.  Date: 12/18/2015 EKG Time: 21:56 Rate: 108 Rhythm: Sinus tachycardia QRS Axis: normal Intervals:  normal ST/T Wave abnormalities: normal Conduction Disturbances: none Narrative Interpretation: unremarkable  ____________________________________________  RADIOLOGY   Ct Head Wo Contrast  Result Date: 12/18/2015 CLINICAL DATA:  55 year old female with seizures and unresponsiveness EXAM: CT HEAD WITHOUT CONTRAST TECHNIQUE: Contiguous axial images were obtained from the base of the skull through the vertex without intravenous contrast. COMPARISON:  Head CT dated 02/06/2015 FINDINGS: Brain: No evidence of acute infarction, hemorrhage, hydrocephalus, extra-axial collection or mass lesion/mass effect. Vascular: No hyperdense vessel or unexpected calcification. Skull: Normal. Negative for fracture or focal lesion. Sinuses/Orbits: No acute finding. Other: None IMPRESSION: No acute intracranial pathology. Electronically Signed   By: Elgie Collard M.D.   On: 12/18/2015 22:51   Dg Chest Portable 1 View  Result Date: 12/18/2015 CLINICAL DATA:  Endotracheal placement. EXAM: PORTABLE CHEST 1 VIEW COMPARISON:  10/23/2015 FINDINGS: Endotracheal tip is 5.5 cm proximal to the carina. Heart size is normal. Mediastinal shadows are normal. There may be mild interstitial edema and  minimal basilar atelectasis. Chronic granuloma right upper lobe as seen previously. No acute bone finding. IMPRESSION: Endotracheal tube tip 5.5 cm above the carina. Probable mild interstitial edema. Mild basilar atelectasis. Electronically Signed   By: Paulina Fusi M.D.   On: 12/18/2015 21:43    ____________________________________________   PROCEDURES  Procedure(s) performed:   .Intubation Date/Time: 12/18/2015 9:37 PM Performed by: Loleta Rose Authorized by: Loleta Rose  Consent:    Consent obtained:  Emergent situation Pre-procedure details:    Patient status:  Unresponsive   Pretreatment meds: etomidate 10 mg IV.   Paralytics:  Succinylcholine Procedure details:    Preoxygenation:  Bag valve mask   Intubation method:  Oral   Oral intubation technique:  Video-assisted   Tube type:  Cuffed   Number of attempts:  1   Ventilation between attempts: no     Cricoid pressure: yes     Tube visualized through cords: yes   Placement assessment:    ETT to lip:  23   Tube secured with:  ETT holder   Breath sounds:  Equal and absent over the epigastrium   Placement verification: chest rise, CXR verification, equal breath sounds and ETCO2 detector     CXR findings:  ETT in proper place Post-procedure details:    Patient tolerance of procedure:  Tolerated well, no immediate complications .Critical Care Performed by: Loleta Rose Authorized by: Loleta Rose  Critical care provider statement:    Critical care time (minutes):  30   Critical care time was exclusive of:  Separately billable procedures and treating other patients   Critical care was necessary to treat or prevent imminent or life-threatening deterioration of the following conditions:  Respiratory failure and CNS failure or compromise   Critical care was time spent personally by me on the following activities:  Development of treatment plan with patient or surrogate, discussions with consultants, evaluation of  patient's response to treatment, examination of patient, obtaining history from patient or surrogate, ordering and performing treatments and interventions, ordering and review of laboratory studies, ordering and review of radiographic studies, pulse oximetry, re-evaluation of patient's condition and review of old charts    Critical Care performed: Yes, see critical care procedure note(s) ____________________________________________   INITIAL IMPRESSION / ASSESSMENT AND PLAN / ED COURSE  Pertinent labs & imaging results that were available during my care of the patient were reviewed by me and considered in my medical decision making (see chart for details).  The patient was not protecting her airway and seemed  to be having seizure-like activity.  I made the decision to RSI her in order to protect the airway.  The patient was accomplished quickly and without complications.  I started the patient on a propofol drip aching that this would help with seizure management as well.  She was somewhat combative afterwards and required 2 additional boluses of a fall of there was not purposeful activity, she was simply thrashing around and endangering the ET tube.  I gave her Versed 4 mg IV in the somewhat unlikely event that this is alcohol related; she has a distant history of alcohol abuse but had a negative alcohol level earlier this morning when she was first seen and reportedly told the ED physician and the psychiatrist that she has not been drinking recently which should mean that she is not having complicated alcohol withdrawal seizures.  I discussed the case with the eLink intensivist and she agreed with the plan.  I asked for her recommendation regarding fosphenytoin bolus and she recommended Keppra instead so I have ordered Keppra 1 g IV.  I also gave vecuronium 10 mg IV given the patient's persistent agitation and I have started a fentanyl drip at 150 g per hour to help with pain control in  addition to the propofol.  I will order a stat head CT once she is settled in with her ET tube given the acute decompensation exhibited by the patient.         Clinical Course   Comment By Time  ETT advanced 2 cm.  Head CT unremarkable.  Hemodynamic status improved.  Discussed in person with ICU PA.   Loleta Roseory Codi Folkerts, MD 10/20 2337    ____________________________________________  FINAL CLINICAL IMPRESSION(S) / ED DIAGNOSES  Final diagnoses:  Intentional drug overdose, initial encounter (HCC)  Depression, unspecified depression type  Acute respiratory failure, unspecified whether with hypoxia or hypercapnia (HCC)  Seizure (HCC)     MEDICATIONS GIVEN DURING THIS VISIT:  Medications - No data to display   NEW OUTPATIENT MEDICATIONS STARTED DURING THIS VISIT:  Discharge Medication List as of 12/18/2015  7:50 PM      Discharge Medication List as of 12/18/2015  7:50 PM      Discharge Medication List as of 12/18/2015  7:50 PM       Note:  This document was prepared using Dragon voice recognition software and may include unintentional dictation errors.    Loleta Roseory Aiva Miskell, MD 12/19/15 0110

## 2015-12-19 NOTE — Progress Notes (Signed)
Notified Dr. Eliane DecreeS. Patel for transfer of service.  -DR 12/19/2015

## 2015-12-19 NOTE — Progress Notes (Signed)
Patient is becoming more alert as day progresses. Asked why she was in the hospital, states that she did not remember taking too many pills, and she thought that she was here for "an eating disorder". Spoke to psych MD on call who stated that she would be coming to see patient. Neuro MD present, discussed care with no new orders at this time.

## 2015-12-19 NOTE — Progress Notes (Signed)
Patient extubated per MD order.  Patient suctioned prior to extubation, left on room air.  Patient tolerated well, will continue to monitor.

## 2015-12-20 DIAGNOSIS — R45851 Suicidal ideations: Secondary | ICD-10-CM

## 2015-12-20 DIAGNOSIS — T40602A Poisoning by unspecified narcotics, intentional self-harm, initial encounter: Secondary | ICD-10-CM

## 2015-12-20 DIAGNOSIS — F3181 Bipolar II disorder: Secondary | ICD-10-CM

## 2015-12-20 LAB — BASIC METABOLIC PANEL
Anion gap: 10 (ref 5–15)
BUN: 12 mg/dL (ref 6–20)
CALCIUM: 8.1 mg/dL — AB (ref 8.9–10.3)
CO2: 23 mmol/L (ref 22–32)
CREATININE: 0.8 mg/dL (ref 0.44–1.00)
Chloride: 106 mmol/L (ref 101–111)
Glucose, Bld: 73 mg/dL (ref 65–99)
Potassium: 3 mmol/L — ABNORMAL LOW (ref 3.5–5.1)
SODIUM: 139 mmol/L (ref 135–145)

## 2015-12-20 LAB — AST: AST: 101 U/L — ABNORMAL HIGH (ref 15–41)

## 2015-12-20 LAB — CBC
HCT: 34.3 % — ABNORMAL LOW (ref 35.0–47.0)
Hemoglobin: 11.5 g/dL — ABNORMAL LOW (ref 12.0–16.0)
MCH: 31.5 pg (ref 26.0–34.0)
MCHC: 33.7 g/dL (ref 32.0–36.0)
MCV: 93.3 fL (ref 80.0–100.0)
PLATELETS: 197 10*3/uL (ref 150–440)
RBC: 3.67 MIL/uL — ABNORMAL LOW (ref 3.80–5.20)
RDW: 14.2 % (ref 11.5–14.5)
WBC: 5.7 10*3/uL (ref 3.6–11.0)

## 2015-12-20 LAB — ALT: ALT: 36 U/L (ref 14–54)

## 2015-12-20 LAB — PROCALCITONIN

## 2015-12-20 MED ORDER — POTASSIUM CHLORIDE CRYS ER 20 MEQ PO TBCR: 40.0000 meq | EXTENDED_RELEASE_TABLET | ORAL | Status: DC | PRN

## 2015-12-20 MED ORDER — POTASSIUM CHLORIDE 10 MEQ/100ML IV SOLN
10.0000 meq | INTRAVENOUS | Status: AC
  Administered 2015-12-20 (×2): 10 meq via INTRAVENOUS
  Filled 2015-12-20 (×4): qty 100

## 2015-12-20 MED ORDER — POTASSIUM CHLORIDE CRYS ER 20 MEQ PO TBCR
40.0000 meq | EXTENDED_RELEASE_TABLET | Freq: Two times a day (BID) | ORAL | Status: AC
  Administered 2015-12-20 (×2): 40 meq via ORAL
  Filled 2015-12-20 (×2): qty 2

## 2015-12-20 NOTE — Consult Note (Signed)
[facility] Consult   Reason for Consult:  Overdose. Referring Physician:  Dr. Ashby Dawes [name] MRN:  [id number] Principal Diagnosis: Bipolar 2 disorder, major depressive episode [facility]) Diagnosis:   Patient Active Problem List   Diagnosis Date Noted  . Bipolar 2 disorder, major depressive episode ([place]) [F31.81] 09/28/2015    Priority: High  . Suicidal ideation [R45.851] 12/18/2015  . Involuntary commitment [Z04.6] 12/18/2015  . Seizure ([place]) [R56.9] 12/18/2015  . Opiate overdose [T40.601A] 09/28/2015  . 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 Opioid use disorder, moderate, dependence ([place]) [F11.20] 09/28/2015  . 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 H/O eating disorder [Z86.59] 09/19/2013    Total Time spent with patient: 1 hour  Subjective:   Identifying data.Patricia [name] is a 55 y.o. female with a history of depression, mood instability, substance abuse and multiple suicide attempts.  Chief complaint. "Same thing happened."   Follow up on 12/20/2015. The patient is still depressed and suicidal. She is alert and oriented. She interacts appropriately. She is still on iv. Dr. Irish Elders, neurology, suggested addition of Depakote for seizures and mood stabilization.  PLAN: 1. Will continue sitter.  2. Will hold Seroquel and Effexor for now.  3. Please continue Lamictal. I concur with addition of Depakote as recommended.  4. Transfer to psychiatry when stable and no iv.    History of present illness. Information was obtained from the patient and the chart. The patient has long history of mental illness. She is well known to our service and was admitted to psychiatry multiple times. She became acutely suicidal the day before admission and had an idea to crash the car to die. When she returned home she was arguing with her husband and overdosed on sleeping pills most likely Ambien  prescribed for her husband and his narcotic painkillers. She was brought to the hospital and initially was feeling fully oriented and physically well. When she was brought to behavioral medicine she experienced seizure episode, was intubated to protect her airways, and was transferred to CCU. The patient is extubated and able to participate in the interview. She reports all symptoms of depression with poor sleep, decreased appetite, anhedonia, feeling of guilt and hopelessness worthlessness, poor energy and concentration, crying spells, social isolation, heightened anxiety, and suicidal thinking. She denies psychotic symptoms or symptoms suggestive of bipolar mania. She denies alcohol or illicit drugs or prescription pill abuse but has a long history of abusing opiates along with other drugs. Her husband reported that his Ambien and narcotics were missing.  Past psychiatric history. She is has been struggling with depression since her teenage years. She had several psychiatric hospital overdose and cutting when younger. One of the overdose was severe enough to require respirator. She has narrowing of her trachea from it. She has been tried on multiple antidepressants. She has been recently tried on Lamictal, Saphris, Doxepin, Effexor, Prozac, Seroquel. She was hospitalized here in July and August. She has never been on antipsychotic. There is a history of alcoholism but she has been able to maintain sobriety for several years now.  Family psychiatric history. She denies any.  Social history. She lives with her husband, her son and his girlfriend. 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   Risk to Self: Is patient at risk for suicide?: Yes Risk to Others:   Prior Inpatient Therapy:   Prior Outpatient Therapy:    Past Medical History:  Past Medical History:  Diagnosis Date  .  Alcohol abuse unk  . Anxiety unk  . Aspiration into airway   . Depression unk    Past Surgical History:  Procedure  Laterality Date  . ABDOMINAL HYSTERECTOMY    . FOOT SURGERY    . TRACHEAL SURGERY     Family History:  Family History  Problem Relation Age of Onset  . Breast cancer Mother   . Hypertension Mother   . Depression Mother   . Anxiety disorder Mother   . Alcohol abuse Father   . Alcohol abuse Sister   . Anxiety disorder Sister   . Bipolar disorder Sister    Social History:  History  Alcohol Use No    Comment: former alcoholic "sober for a month now"     History  Drug Use No    Social History   Social History  . Marital status: Married    Spouse name: N/A  . Number of children: N/A  . Years of education: N/A   Social History Main Topics  . Smoking status: Former Smoker    Types: Cigarettes  . Smokeless tobacco: Never Used  . Alcohol use No     Comment: former alcoholic "sober for a month now"  . Drug use: No  . Sexual activity: Not Currently   Other Topics Concern  . None   Social History Narrative  . None   Additional Social History:    Allergies:   Allergies  Allergen Reactions  . Penicillins Hives    Has patient had a PCN reaction causing immediate rash, facial/tongue/throat swelling, SOB or lightheadedness with hypotension: Yes Has patient had a PCN reaction causing severe rash involving mucus membranes or skin necrosis: No Has patient had a PCN reaction that required hospitalization Yes Has patient had a PCN reaction occurring within the last 10 years: Yes If all of the above answers are "NO", then may proceed with Cephalosporin use.     Labs:  Results for orders placed or performed during the hospital encounter of 12/18/15 (from the past 48 hour(s))  Triglycerides     Status: None   Collection Time: 12/18/15  9:04 PM  Result Value Ref Range   Triglycerides 112 <150 mg/dL  Amylase     Status: Abnormal   Collection Time: 12/18/15  9:04 PM  Result Value Ref Range   Amylase 125 (H) 28 - 100 U/L  Lipase, blood     Status: None   Collection Time:  12/18/15  9:04 PM  Result Value Ref Range   Lipase 19 11 - 51 U/L  Procalcitonin - Baseline     Status: None   Collection Time: 12/18/15  9:04 PM  Result Value Ref Range   Procalcitonin <0.10 ng/mL    Comment:        Interpretation: PCT (Procalcitonin) <= 0.5 ng/mL: Systemic infection (sepsis) is not likely. Local bacterial infection is possible. (NOTE)         ICU PCT Algorithm               Non ICU PCT Algorithm    ----------------------------     ------------------------------         PCT < 0.25 ng/mL                 PCT < 0.1 ng/mL     Stopping of antibiotics            Stopping of antibiotics       strongly encouraged.  strongly encouraged.    ----------------------------     ------------------------------       PCT level decrease by               PCT < 0.25 ng/mL       >= 80% from peak PCT       OR PCT 0.25 - 0.5 ng/mL          Stopping of antibiotics                                             encouraged.     Stopping of antibiotics           encouraged.    ----------------------------     ------------------------------       PCT level decrease by              PCT >= 0.25 ng/mL       < 80% from peak PCT        AND PCT >= 0.5 ng/mL            Continuin g antibiotics                                              encouraged.       Continuing antibiotics            encouraged.    ----------------------------     ------------------------------     PCT level increase compared          PCT > 0.5 ng/mL         with peak PCT AND          PCT >= 0.5 ng/mL             Escalation of antibiotics                                          strongly encouraged.      Escalation of antibiotics        strongly encouraged.   Troponin I     Status: Abnormal   Collection Time: 12/18/15  9:04 PM  Result Value Ref Range   Troponin I 0.05 (HH) <0.03 ng/mL    Comment: CRITICAL RESULT CALLED TO, READ BACK BY AND VERIFIED WITH CHRISTINE KIM AT 2306 12/18/15.PMH  Blood gas, arterial      Status: Abnormal   Collection Time: 12/18/15  9:05 PM  Result Value Ref Range   FIO2 1.00    Delivery systems VENTILATOR    Mode ASSIST CONTROL    VT 500 mL   Peep/cpap 5.0 cm H20   pH, Arterial 7.36 7.350 - 7.450   pCO2 arterial 43 32.0 - 48.0 mmHg   pO2, Arterial 524 (H) 83.0 - 108.0 mmHg   Bicarbonate 24.3 20.0 - 28.0 mmol/L   Acid-base deficit 1.2 0.0 - 2.0 mmol/L   O2 Saturation 100.0 %   Patient temperature 37.0    Collection site LEFT RADIAL    Sample type ARTERIAL DRAW    Allens test (pass/fail) PASS PASS   Mechanical Rate 18   Lactic acid, plasma     Status:  None   Collection Time: 12/18/15  9:43 PM  Result Value Ref Range   Lactic Acid, Venous 1.6 0.5 - 1.9 mmol/L  Glucose, capillary     Status: Abnormal   Collection Time: 12/18/15 11:18 PM  Result Value Ref Range   Glucose-Capillary 113 (H) 65 - 99 mg/dL  MRSA PCR Screening     Status: None   Collection Time: 12/18/15 11:29 PM  Result Value Ref Range   MRSA by PCR NEGATIVE NEGATIVE    Comment:        The GeneXpert MRSA Assay (FDA approved for NASAL specimens only), is one component of a comprehensive MRSA colonization surveillance program. It is not intended to diagnose MRSA infection nor to guide or monitor treatment for MRSA infections.   Lactic acid, plasma     Status: None   Collection Time: 12/19/15 12:08 AM  Result Value Ref Range   Lactic Acid, Venous 0.8 0.5 - 1.9 mmol/L  CBC     Status: Abnormal   Collection Time: 12/19/15  4:44 AM  Result Value Ref Range   WBC 8.0 3.6 - 11.0 K/uL   RBC 3.49 (L) 3.80 - 5.20 MIL/uL   Hemoglobin 11.1 (L) 12.0 - 16.0 g/dL   HCT 32.1 (L) 35.0 - 47.0 %   MCV 92.0 80.0 - 100.0 fL   MCH 31.7 26.0 - 34.0 pg   MCHC 34.4 32.0 - 36.0 g/dL   RDW 14.3 11.5 - 14.5 %   Platelets 209 150 - 440 K/uL  Basic metabolic panel     Status: Abnormal   Collection Time: 12/19/15  4:44 AM  Result Value Ref Range   Sodium 139 135 - 145 mmol/L   Potassium 3.6 3.5 - 5.1 mmol/L    Chloride 105 101 - 111 mmol/L   CO2 23 22 - 32 mmol/L   Glucose, Bld 95 65 - 99 mg/dL   BUN 16 6 - 20 mg/dL   Creatinine, Ser 0.92 0.44 - 1.00 mg/dL   Calcium 8.0 (L) 8.9 - 10.3 mg/dL   GFR calc non Af Amer >60 >60 mL/min   GFR calc Af Amer >60 >60 mL/min    Comment: (NOTE) The eGFR has been calculated using the CKD EPI equation. This calculation has not been validated in all clinical situations. eGFR's persistently <60 mL/min signify possible Chronic Kidney Disease.    Anion gap 11 5 - 15  Magnesium     Status: None   Collection Time: 12/19/15  4:44 AM  Result Value Ref Range   Magnesium 2.4 1.7 - 2.4 mg/dL  Phosphorus     Status: None   Collection Time: 12/19/15  4:44 AM  Result Value Ref Range   Phosphorus 3.9 2.5 - 4.6 mg/dL  Procalcitonin     Status: None   Collection Time: 12/19/15  4:44 AM  Result Value Ref Range   Procalcitonin <0.10 ng/mL    Comment:        Interpretation: PCT (Procalcitonin) <= 0.5 ng/mL: Systemic infection (sepsis) is not likely. Local bacterial infection is possible. (NOTE)         ICU PCT Algorithm               Non ICU PCT Algorithm    ----------------------------     ------------------------------         PCT < 0.25 ng/mL                 PCT < 0.1 ng/mL  Stopping of antibiotics            Stopping of antibiotics       strongly encouraged.               strongly encouraged.    ----------------------------     ------------------------------       PCT level decrease by               PCT < 0.25 ng/mL       >= 80% from peak PCT       OR PCT 0.25 - 0.5 ng/mL          Stopping of antibiotics                                             encouraged.     Stopping of antibiotics           encouraged.    ----------------------------     ------------------------------       PCT level decrease by              PCT >= 0.25 ng/mL       < 80% from peak PCT        AND PCT >= 0.5 ng/mL            Continuin g antibiotics                                               encouraged.       Continuing antibiotics            encouraged.    ----------------------------     ------------------------------     PCT level increase compared          PCT > 0.5 ng/mL         with peak PCT AND          PCT >= 0.5 ng/mL             Escalation of antibiotics                                          strongly encouraged.      Escalation of antibiotics        strongly encouraged.   Troponin I     Status: Abnormal   Collection Time: 12/19/15  4:44 AM  Result Value Ref Range   Troponin I 0.03 (HH) <0.03 ng/mL    Comment: CRITICAL VALUE NOTED. VALUE IS CONSISTENT WITH PREVIOUSLY REPORTED/CALLED VALUE.PMH  Troponin I     Status: None   Collection Time: 12/19/15  9:36 AM  Result Value Ref Range   Troponin I <0.03 <0.03 ng/mL  Acetaminophen level     Status: Abnormal   Collection Time: 12/19/15  9:43 AM  Result Value Ref Range   Acetaminophen (Tylenol), Serum <10 (L) 10 - 30 ug/mL    Comment:        THERAPEUTIC CONCENTRATIONS VARY SIGNIFICANTLY. A RANGE OF 10-30 ug/mL MAY BE AN EFFECTIVE CONCENTRATION FOR MANY PATIENTS. HOWEVER, SOME ARE BEST TREATED AT CONCENTRATIONS OUTSIDE THIS RANGE. ACETAMINOPHEN CONCENTRATIONS >150 ug/mL AT 4 HOURS AFTER INGESTION  AND >50 ug/mL AT 12 HOURS AFTER INGESTION ARE OFTEN ASSOCIATED WITH TOXIC REACTIONS.   Procalcitonin     Status: None   Collection Time: 12/20/15  5:25 AM  Result Value Ref Range   Procalcitonin <0.10 ng/mL    Comment:        Interpretation: PCT (Procalcitonin) <= 0.5 ng/mL: Systemic infection (sepsis) is not likely. Local bacterial infection is possible. (NOTE)         ICU PCT Algorithm               Non ICU PCT Algorithm    ----------------------------     ------------------------------         PCT < 0.25 ng/mL                 PCT < 0.1 ng/mL     Stopping of antibiotics            Stopping of antibiotics       strongly encouraged.               strongly encouraged.     ----------------------------     ------------------------------       PCT level decrease by               PCT < 0.25 ng/mL       >= 80% from peak PCT       OR PCT 0.25 - 0.5 ng/mL          Stopping of antibiotics                                             encouraged.     Stopping of antibiotics           encouraged.    ----------------------------     ------------------------------       PCT level decrease by              PCT >= 0.25 ng/mL       < 80% from peak PCT        AND PCT >= 0.5 ng/mL            Continuin g antibiotics                                              encouraged.       Continuing antibiotics            encouraged.    ----------------------------     ------------------------------     PCT level increase compared          PCT > 0.5 ng/mL         with peak PCT AND          PCT >= 0.5 ng/mL             Escalation of antibiotics                                          strongly encouraged.      Escalation of antibiotics        strongly encouraged.   CBC     Status: Abnormal  Collection Time: 12/20/15  5:25 AM  Result Value Ref Range   WBC 5.7 3.6 - 11.0 K/uL   RBC 3.67 (L) 3.80 - 5.20 MIL/uL   Hemoglobin 11.5 (L) 12.0 - 16.0 g/dL   HCT 34.3 (L) 35.0 - 47.0 %   MCV 93.3 80.0 - 100.0 fL   MCH 31.5 26.0 - 34.0 pg   MCHC 33.7 32.0 - 36.0 g/dL   RDW 14.2 11.5 - 14.5 %   Platelets 197 150 - 440 K/uL  Basic metabolic panel     Status: Abnormal   Collection Time: 12/20/15  5:25 AM  Result Value Ref Range   Sodium 139 135 - 145 mmol/L   Potassium 3.0 (L) 3.5 - 5.1 mmol/L   Chloride 106 101 - 111 mmol/L   CO2 23 22 - 32 mmol/L   Glucose, Bld 73 65 - 99 mg/dL   BUN 12 6 - 20 mg/dL   Creatinine, Ser 0.80 0.44 - 1.00 mg/dL   Calcium 8.1 (L) 8.9 - 10.3 mg/dL   GFR calc non Af Amer >60 >60 mL/min   GFR calc Af Amer >60 >60 mL/min    Comment: (NOTE) The eGFR has been calculated using the CKD EPI equation. This calculation has not been validated in all clinical  situations. eGFR's persistently <60 mL/min signify possible Chronic Kidney Disease.    Anion gap 10 5 - 15  AST     Status: Abnormal   Collection Time: 12/20/15  5:25 AM  Result Value Ref Range   AST 101 (H) 15 - 41 U/L  ALT     Status: None   Collection Time: 12/20/15  5:25 AM  Result Value Ref Range   ALT 36 14 - 54 U/L    Current Facility-Administered Medications  Medication Dose Route Frequency Provider Last Rate Last Dose  . 0.9 %  sodium chloride infusion  250 mL Intravenous PRN Mikael Spray, NP   Stopped at 12/19/15 1806  . acetaminophen (TYLENOL) tablet 650 mg  650 mg Oral Q6H PRN Mikael Spray, NP      . atorvastatin (LIPITOR) tablet 20 mg  20 mg Per NG tube QHS Mikael Spray, NP   20 mg at 12/19/15 2222  . bisacodyl (DULCOLAX) suppository 10 mg  10 mg Rectal Daily PRN Mikael Spray, NP      . chlorhexidine gluconate (MEDLINE KIT) (PERIDEX) 0.12 % solution 15 mL  15 mL Mouth Rinse BID Mikael Spray, NP   15 mL at 12/19/15 0748  . famotidine (PEPCID) IVPB 20 mg premix  20 mg Intravenous Q12H Mikael Spray, NP   20 mg at 12/20/15 0943  . folic acid injection 1 mg  1 mg Intravenous Daily Mikael Spray, NP   1 mg at 12/19/15 1009  . heparin injection 5,000 Units  5,000 Units Subcutaneous Q8H Mikael Spray, NP   5,000 Units at 12/20/15 1346  . ipratropium-albuterol (DUONEB) 0.5-2.5 (3) MG/3ML nebulizer solution 3 mL  3 mL Nebulization Q6H PRN Mikael Spray, NP      . lamoTRIgine (LAMICTAL) tablet 200 mg  200 mg Per NG tube Daily Mikael Spray, NP   200 mg at 12/20/15 0943  . levETIRAcetam (KEPPRA) 500 mg in sodium chloride 0.9 % 100 mL IVPB  500 mg Intravenous Q12H Mikael Spray, NP   500 mg at 12/20/15 1346  . MEDLINE mouth rinse  15 mL Mouth Rinse q12n4p Laverle Hobby, MD      .  midazolam (VERSED) injection 2 mg  2 mg Intravenous Q15 min PRN Mikael Spray, NP      . midazolam (VERSED) injection 2-4 mg  2-4 mg  Intravenous Q2H PRN Mikael Spray, NP      . ondansetron (ZOFRAN) injection 4 mg  4 mg Intravenous Q6H PRN Mikael Spray, NP      . potassium chloride SA (K-DUR,KLOR-CON) CR tablet 40 mEq  40 mEq Oral BID Epifanio Lesches, MD   40 mEq at 12/20/15 1346  . sennosides (SENOKOT) 8.8 MG/5ML syrup 5 mL  5 mL Per Tube BID PRN Mikael Spray, NP      . sodium chloride flush (NS) 0.9 % injection 3 mL  3 mL Intravenous Q12H Mikael Spray, NP   3 mL at 12/19/15 2223  . sodium chloride flush (NS) 0.9 % injection 3 mL  3 mL Intravenous Q12H Mikael Spray, NP   3 mL at 12/19/15 2223  . sodium chloride flush (NS) 0.9 % injection 3 mL  3 mL Intravenous Q12H Mikael Spray, NP   3 mL at 12/19/15 2222  . thiamine (B-1) injection 100 mg  100 mg Intravenous Daily Mikael Spray, NP   100 mg at 12/20/15 1779    Musculoskeletal: Strength & Muscle Tone: within normal limits Gait & Station: normal Patient leans: N/A  Psychiatric Specialty Exam: Physical Exam  Nursing note and vitals reviewed.   Review of Systems  Psychiatric/Behavioral: Positive for depression, substance abuse and suicidal ideas.  All other systems reviewed and are negative.   Blood pressure 122/70, pulse 83, temperature 98.3 F (36.8 C), temperature source Oral, resp. rate 11, height 5' 4"  (1.626 m), weight 74.7 kg (164 lb 10.9 oz), SpO2 99 %.Body mass index is 28.27 kg/m.  General Appearance: Casual  Eye Contact:  Good  Speech:  Clear and Coherent  Volume:  Normal  Mood:  Depressed, Hopeless and Worthless  Affect:  Blunt  Thought Process:  Goal Directed  Orientation:  Full (Time, Place, and Person)  Thought Content:  WDL  Suicidal Thoughts:  Yes.  with intent/plan  Homicidal Thoughts:  No  Memory:  Immediate;   Fair Recent;   Fair Remote;   Fair  Judgement:  Poor  Insight:  Lacking  Psychomotor Activity:  Normal  Concentration:  Concentration: Fair and Attention Span: Fair  Recall:  [employer] of Knowledge:  Fair  Language:  Fair  Akathisia:  No  Handed:  Right  AIMS (if indicated):     Assets:  Communication Skills Desire for Improvement Housing Physical Health Resilience Social Support  ADL's:  Intact  Cognition:  WNL  Sleep:        Treatment Plan Summary: Daily contact with patient to assess and evaluate symptoms and progress in treatment and Medication management   Patricia [name] has a history of depression, mood instability, substance abuse and multiple serious suicide attempt admitted after yet another attempt by narcotic and sleeping pill overdose.  1. She meets criteria for psychiatric admission when medically stable.  2. Please continue Lamictal.  3. Please hold other psychotropic medications.  4. I will follow along tomorrow.     Disposition: Recommend psychiatric Inpatient admission when medically cleared. Supportive therapy provided about ongoing stressors. Discussed crisis plan, support from social network, calling 911, coming to the Emergency Department, and calling Suicide Hotline.  Orson Slick, MD 12/20/2015 2:26 PM

## 2015-12-20 NOTE — Progress Notes (Signed)
Pt arrived from ccu via bed, sitter present. Pt is polite and cooperative at this time. VS stable. Lungs are clear bilat, pt has dry cough. S1S2 auscultated, Abdomen is soft, bs hypoactive. Pt denied difficulty voiding. Pt skin is intact, but pt has a dark purple linear bruiseto her L hip area, pimple to L buttock. PICV #20 intact to R arm, iv pepcid infusing and iv potassium run infusing as well, PIV sites are free of redness and swelling. Pt denied pain on arrival. This writer informed pt that Dr Luberta MutterKonidena will be seeing her today,pt aware that she will go to behavioral health when cleared.

## 2015-12-20 NOTE — Progress Notes (Signed)
Pt being transferred to room 136. Report called to Raynelle FanningJulie, Charity fundraiserN. Pt and belongings transferred to room 136 without incident.

## 2015-12-20 NOTE — Progress Notes (Signed)
Pt called this writer to state that her throat and chest hurt when she coughs. Pt was offered tylenol and she refused it.

## 2015-12-20 NOTE — Progress Notes (Addendum)
[facility] - [place] at [facility]   PATIENT NAME: Patricia [name]    MR#:  [id number]  DATE OF BIRTH:  [birth year]  SUBJECTIVE:  Patient states she is depressed. Asking the medicines to be changes to the by mouth from IV. Other complaints. Transferred from ICU service. Admitted to ICU for respiratory failure with altered mental status, overdose on multiple medications   CHIEF COMPLAINT:   Chief Complaint  Patient presents with  . Loss of Consciousness    REVIEW OF SYSTEMS:   ROS CONSTITUTIONAL: No fever, fatigue or weakness.  EYES: No blurred or double vision.  EARS, NOSE, AND THROAT: No tinnitus or ear pain.  RESPIRATORY: No cough, shortness of breath, wheezing or hemoptysis.  CARDIOVASCULAR: No chest pain, orthopnea, edema.  GASTROINTESTINAL: No nausea, vomiting, diarrhea or abdominal pain.  GENITOURINARY: No dysuria, hematuria.  ENDOCRINE: No polyuria, nocturia,  HEMATOLOGY: No anemia, easy bruising or bleeding SKIN: No rash or lesion. MUSCULOSKELETAL: No joint pain or arthritis.   NEUROLOGIC: No tingling, numbness, weakness.  PSYCHIATRY:depression   DRUG ALLERGIES:   Allergies  Allergen Reactions  . Penicillins Hives    Has patient had a PCN reaction causing immediate rash, facial/tongue/throat swelling, SOB or lightheadedness with hypotension: Yes Has patient had a PCN reaction causing severe rash involving mucus membranes or skin necrosis: No Has patient had a PCN reaction that required hospitalization Yes Has patient had a PCN reaction occurring within the last 10 years: Yes If all of the above answers are "NO", then may proceed with Cephalosporin use.     VITALS:  Blood pressure 122/70, pulse 83, temperature 98.3 F (36.8 C), temperature source Oral, resp. rate 11, height 5\' 4"  (1.626 m), weight 74.7 kg (164 lb 10.9 oz), SpO2 99 %.  PHYSICAL EXAMINATION:  GENERAL:  55 y.o.-year-old patient lying in the bed with no acute distress.  EYES:  Pupils equal, round, reactive to light and accommodation. No scleral icterus. Extraocular muscles intact.  HEENT: Head atraumatic, normocephalic. Oropharynx and nasopharynx clear.  NECK:  Supple, no jugular venous distention. No thyroid enlargement, no tenderness.  LUNGS: Normal breath sounds bilaterally, no wheezing, rales,rhonchi or crepitation. No use of accessory muscles of respiration.  CARDIOVASCULAR: S1, S2 normal. No murmurs, rubs, or gallops.  ABDOMEN: Soft, nontender, nondistended. Bowel sounds present. No organomegaly or mass.  EXTREMITIES: No pedal edema, cyanosis, or clubbing.  NEUROLOGIC: Cranial nerves II through XII are intact. Muscle strength 5/5 in all extremities. Sensation intact. Gait not checked.  PSYCHIATRIC: The patient is alert and oriented x 3.  SKIN: No obvious rash, lesion, or ulcer.    LABORATORY PANEL:   CBC  Recent Labs Lab 12/20/15 0525  WBC 5.7  HGB 11.5*  HCT 34.3*  PLT 197   ------------------------------------------------------------------------------------------------------------------  Chemistries   Recent Labs Lab 12/18/15 0927 12/19/15 0444 12/20/15 0525  NA 138 139 139  K 4.0 3.6 3.0*  CL 100* 105 106  CO2 26 23 23   GLUCOSE 114* 95 73  BUN 15 16 12   CREATININE 0.94 0.92 0.80  CALCIUM 9.5 8.0* 8.1*  MG  --  2.4  --   AST 86*  --  101*  ALT 34  --  36  ALKPHOS 105  --   --   BILITOT 0.6  --   --    ------------------------------------------------------------------------------------------------------------------  Cardiac Enzymes  Recent Labs Lab 12/19/15 0936  TROPONINI <0.03   ------------------------------------------------------------------------------------------------------------------  RADIOLOGY:  Dg Abd 1 View  Result Date: 12/19/2015 CLINICAL DATA:  Orogastric tube placement.  Initial encounter. EXAM: ABDOMEN - 1 VIEW COMPARISON:  CT of the abdomen and pelvis performed 08/29/2014 FINDINGS: The patient's  enteric tube is noted ending overlying the antrum of the stomach. The visualized bowel gas pattern is unremarkable. Scattered air and stool filled loops of colon are seen; no abnormal dilatation of small bowel loops is seen to suggest small bowel obstruction. No free intra-abdominal air is identified, though evaluation for free air is limited on a single supine view. The visualized osseous structures are within normal limits; the sacroiliac joints are unremarkable in appearance. The visualized lung bases are essentially clear. IMPRESSION: 1. Enteric tube noted ending overlying the antrum of the stomach. 2. Unremarkable bowel gas pattern; no free intra-abdominal air seen. Moderate amount of stool noted in the colon. Electronically Signed   By: Roanna RaiderJeffery  Chang M.D.   On: 12/19/2015 01:55   Ct Head Wo Contrast  Result Date: 12/18/2015 CLINICAL DATA:  55 year old female with seizures and unresponsiveness EXAM: CT HEAD WITHOUT CONTRAST TECHNIQUE: Contiguous axial images were obtained from the base of the skull through the vertex without intravenous contrast. COMPARISON:  Head CT dated 02/06/2015 FINDINGS: Brain: No evidence of acute infarction, hemorrhage, hydrocephalus, extra-axial collection or mass lesion/mass effect. Vascular: No hyperdense vessel or unexpected calcification. Skull: Normal. Negative for fracture or focal lesion. Sinuses/Orbits: No acute finding. Other: None IMPRESSION: No acute intracranial pathology. Electronically Signed   By: Elgie CollardArash  Radparvar M.D.   On: 12/18/2015 22:51   Dg Chest Port 1 View  Result Date: 12/19/2015 CLINICAL DATA:  55 year old female with acute respiratory failure EXAM: PORTABLE CHEST 1 VIEW COMPARISON:  Chest radiograph dated 12/18/2015 FINDINGS: Endotracheal tube with tip above the carina in stable positioning. An enteric tube courses into the upper abdomen with tip beyond the inferior margin of the image. The lungs are clear. There is no pleural effusion or  pneumothorax. The cardiac silhouette is within normal limits. No acute osseous pathology. IMPRESSION: No acute cardiopulmonary process. Support device in stable positioning. Electronically Signed   By: Elgie CollardArash  Radparvar M.D.   On: 12/19/2015 07:05   Dg Chest Port 1 View  Result Date: 12/19/2015 CLINICAL DATA:  Endotracheal tube placement.  Initial encounter. EXAM: PORTABLE CHEST 1 VIEW COMPARISON:  Chest radiograph performed earlier today at 9:07 p.m. FINDINGS: The patient's endotracheal tube is seen ending 3-4 cm above the carina. An enteric tube is noted extending below the diaphragm. The lungs are well-aerated. Mild vascular congestion is noted. There is no evidence of focal opacification, pleural effusion or pneumothorax. The cardiomediastinal silhouette is borderline normal in size. No acute osseous abnormalities are seen. IMPRESSION: 1. Endotracheal tube seen ending 3-4 cm above the carina. 2. Mild vascular congestion noted.  Lungs remain grossly clear. Electronically Signed   By: Roanna RaiderJeffery  Chang M.D.   On: 12/19/2015 01:56   Dg Chest Portable 1 View  Result Date: 12/18/2015 CLINICAL DATA:  Endotracheal placement. EXAM: PORTABLE CHEST 1 VIEW COMPARISON:  10/23/2015 FINDINGS: Endotracheal tip is 5.5 cm proximal to the carina. Heart size is normal. Mediastinal shadows are normal. There may be mild interstitial edema and minimal basilar atelectasis. Chronic granuloma right upper lobe as seen previously. No acute bone finding. IMPRESSION: Endotracheal tube tip 5.5 cm above the carina. Probable mild interstitial edema. Mild basilar atelectasis. Electronically Signed   By: Paulina FusiMark  Shogry M.D.   On: 12/18/2015 21:43    EKG:   Orders placed or performed during the hospital encounter of 12/18/15  . EKG 12-Lead  .  EKG 12-Lead  . EKG 12-Lead  . EKG 12-Lead    ASSESSMENT AND PLAN:   2.Altered  Mental status with the overdose on multiple medications guarding Ambien, painkillers; causing respiratory  failure status post intubation, extubation, transferred from ICU yesterday. Patient right now is alert, awake, oriented. Says that she is still depressed: Continue one-to-one observation, psychiatrically follow-up. 2.seizures; continue Lamictal,seizure yesterday. Seen by neurology, Continue Keppra, Lamictal, patient is requesting  keppra  to be changed to by mouth. #3 watch for further  seizures today, likely discharge to [facility] for tomorrow. #4 hypokalemia replace the potassium.  All the records are reviewed and case discussed with Care Management/Social Workerr. Management plans discussed with the patient, family and they are in agreement.  CODE STATUS: full  TOTAL TIME TAKING CARE OF THIS PATIENT: .   Possible transfer to [facility] tomorrow.   Katha Hamming M.D on 12/20/2015 at 11:18 AM  Between 7am to 6pm - Pager - [phone]  After 6pm go to [email or web address] - password [email or web address]  [facility]  Office  [phone]  CC: Primary care physician; Evie Lacks, NP   Note: This dictation was prepared with Dragon dictation along with smaller phrase technology. Any transcriptional errors that result from this process are unintentional.

## 2015-12-20 NOTE — Progress Notes (Addendum)
[facility] Critical Care Medicine Progess Note    ASSESSMENT/PLAN    DISCUSSION: 55 year old Caucasian female presenting with an overdose on multiple medications, suicide attempt, new onset seizures and respiratory failure secondary to altered mental status  ASSESSMENT / PLAN:  PULMONARY A: Acute respiratory failure secondary to altered mental status/SEIZURE activity; now appears improved.  Mild pulmonary edema on chest x-ray P:   Nebulized bronchodilators as needed. Monitor fluid status closely; no need for diuresis at this time  CARDIOVASCULAR A:  Sinus tachycardia Mild hypotension-likely due to sedating medications P:  Hemodynamic monitoring per ICU protocol Maintain MAP greater than or equal 65 When necessary boluses for map less than 65 Cycle troponin 2-D echo pending  RENAL A:   No acute issues P:   Trend creatinine Monitor intake and output Replace K.   GASTROINTESTINAL A:   No acute issues P:   Pepcid for GI prophylaxis  HEMATOLOGIC A:   Mild anemia-baseline hemoglobin 13.7; now down to 11.1 P:  Heparin for VTE prophylaxis Trend CBC  INFECTIOUS A:   No acute issues P:   Monitor WBC and fever curve  ENDOCRINE A:   No acute issues   P:   Monitor blood glucose with BMP  NEUROLOGIC A:   Possible seizure activity, now appears stable/resolved.  P:   Continue management per neuro recs.   Psychiatry A: Suicide attempt. History of bipolar disorder. History of depression and anxiety P: Suicide precautions Continue meds per neuro recs.  Psych consult-psych  already consulted.   ---------------------------------------   ----------------------------------------   Name: Patricia [name] MRN: [id number] DOB: 11/26/1960    ADMISSION DATE:  12/18/2015    SUBJECTIVE:   Pt awka and alert, no new complaints.   Review of Systems:  Constitutional: Feels well. Cardiovascular: No chest pain.  Pulmonary: Denies dyspnea.     The remainder of systems were reviewed and were found to be negative other than what is documented in the HPI.    VITAL SIGNS: Temp:  [98 F (36.7 C)-100.1 F (37.8 C)] 99.2 F (37.3 C) (10/22 0800) Pulse Rate:  [79-122] 83 (10/22 0800) Resp:  [9-23] 9 (10/22 0800) BP: (94-126)/(50-74) 111/68 (10/22 0800) SpO2:  [97 %-99 %] 99 % (10/22 0800) Weight:  [164 lb 10.9 oz (74.7 kg)] 164 lb 10.9 oz (74.7 kg) (10/22 0400) HEMODYNAMICS:   VENTILATOR SETTINGS:   INTAKE / OUTPUT:  Intake/Output Summary (Last 24 hours) at 12/20/15 0930 Last data filed at 12/20/15 0431  Gross per 24 hour  Intake              435 ml  Output             1350 ml  Net             -915 ml    PHYSICAL EXAMINATION: Physical Examination:   VS: BP 111/68 (BP Location: Right Arm)   Pulse 83   Temp 99.2 F (37.3 C) (Oral)   Resp (!) 9   Ht 5\' 4"  (1.626 m)   Wt 164 lb 10.9 oz (74.7 kg)   SpO2 99%   BMI 28.27 kg/m   General Appearance: No distress  Neuro:without focal findings, mental status normal. HEENT: PERRLA, EOM intact. Pulmonary: normal breath sounds   CardiovascularNormal S1,S2.  No m/r/g.   Abdomen: Benign, Soft, non-tender. Renal:  No costovertebral tenderness  GU:  Not performed at this time. Endocrine: No evident thyromegaly. Skin:   warm, no rashes, no ecchymosis  Extremities: normal, no  cyanosis, clubbing.   LABS:   LABORATORY PANEL:   CBC  Recent Labs Lab 12/20/15 0525  WBC 5.7  HGB 11.5*  HCT 34.3*  PLT 197    Chemistries   Recent Labs Lab 12/18/15 0927 12/19/15 0444 12/20/15 0525  NA 138 139 139  K 4.0 3.6 3.0*  CL 100* 105 106  CO2 26 23 23   GLUCOSE 114* 95 73  BUN 15 16 12   CREATININE 0.94 0.92 0.80  CALCIUM 9.5 8.0* 8.1*  MG  --  2.4  --   PHOS  --  3.9  --   AST 86*  --  101*  ALT 34  --  36  ALKPHOS 105  --   --   BILITOT 0.6  --   --      Recent Labs Lab 12/18/15 0934 12/18/15 2318  GLUCAP 116* 113*    Recent Labs Lab 12/18/15 2105   PHART 7.36  PCO2ART 43  PO2ART 524*    Recent Labs Lab 12/18/15 0927 12/20/15 0525  AST 86* 101*  ALT 34 36  ALKPHOS 105  --   BILITOT 0.6  --   ALBUMIN 4.3  --     Cardiac Enzymes  Recent Labs Lab 12/19/15 0936  TROPONINI <0.03    RADIOLOGY:  Dg Abd 1 View  Result Date: 12/19/2015 CLINICAL DATA:  Orogastric tube placement.  Initial encounter. EXAM: ABDOMEN - 1 VIEW COMPARISON:  CT of the abdomen and pelvis performed 08/29/2014 FINDINGS: The patient's enteric tube is noted ending overlying the antrum of the stomach. The visualized bowel gas pattern is unremarkable. Scattered air and stool filled loops of colon are seen; no abnormal dilatation of small bowel loops is seen to suggest small bowel obstruction. No free intra-abdominal air is identified, though evaluation for free air is limited on a single supine view. The visualized osseous structures are within normal limits; the sacroiliac joints are unremarkable in appearance. The visualized lung bases are essentially clear. IMPRESSION: 1. Enteric tube noted ending overlying the antrum of the stomach. 2. Unremarkable bowel gas pattern; no free intra-abdominal air seen. Moderate amount of stool noted in the colon. Electronically Signed   By: Roanna Raider M.D.   On: 12/19/2015 01:55   Ct Head Wo Contrast  Result Date: 12/18/2015 CLINICAL DATA:  55 year old female with seizures and unresponsiveness EXAM: CT HEAD WITHOUT CONTRAST TECHNIQUE: Contiguous axial images were obtained from the base of the skull through the vertex without intravenous contrast. COMPARISON:  Head CT dated 02/06/2015 FINDINGS: Brain: No evidence of acute infarction, hemorrhage, hydrocephalus, extra-axial collection or mass lesion/mass effect. Vascular: No hyperdense vessel or unexpected calcification. Skull: Normal. Negative for fracture or focal lesion. Sinuses/Orbits: No acute finding. Other: None IMPRESSION: No acute intracranial pathology. Electronically  Signed   By: Elgie Collard M.D.   On: 12/18/2015 22:51   Dg Chest Port 1 View  Result Date: 12/19/2015 CLINICAL DATA:  55 year old female with acute respiratory failure EXAM: PORTABLE CHEST 1 VIEW COMPARISON:  Chest radiograph dated 12/18/2015 FINDINGS: Endotracheal tube with tip above the carina in stable positioning. An enteric tube courses into the upper abdomen with tip beyond the inferior margin of the image. The lungs are clear. There is no pleural effusion or pneumothorax. The cardiac silhouette is within normal limits. No acute osseous pathology. IMPRESSION: No acute cardiopulmonary process. Support device in stable positioning. Electronically Signed   By: Elgie Collard M.D.   On: 12/19/2015 07:05   Dg Chest [facility]  Result Date: 12/19/2015 CLINICAL DATA:  Endotracheal tube placement.  Initial encounter. EXAM: PORTABLE CHEST 1 VIEW COMPARISON:  Chest radiograph performed earlier today at 9:07 p.m. FINDINGS: The patient's endotracheal tube is seen ending 3-4 cm above the carina. An enteric tube is noted extending below the diaphragm. The lungs are well-aerated. Mild vascular congestion is noted. There is no evidence of focal opacification, pleural effusion or pneumothorax. The cardiomediastinal silhouette is borderline normal in size. No acute osseous abnormalities are seen. IMPRESSION: 1. Endotracheal tube seen ending 3-4 cm above the carina. 2. Mild vascular congestion noted.  Lungs remain grossly clear. Electronically Signed   By: Roanna Raider M.D.   On: 12/19/2015 01:56   Dg Chest Portable 1 View  Result Date: 12/18/2015 CLINICAL DATA:  Endotracheal placement. EXAM: PORTABLE CHEST 1 VIEW COMPARISON:  10/23/2015 FINDINGS: Endotracheal tip is 5.5 cm proximal to the carina. Heart size is normal. Mediastinal shadows are normal. There may be mild interstitial edema and minimal basilar atelectasis. Chronic granuloma right upper lobe as seen previously. No acute bone finding.  IMPRESSION: Endotracheal tube tip 5.5 cm above the carina. Probable mild interstitial edema. Mild basilar atelectasis. Electronically Signed   By: Paulina Fusi M.D.   On: 12/18/2015 21:43       --Wells Guiles, MD.  ICU Pager: [phone] [facility] Office Number: [phone]  Santiago Glad, M.D.  Stephanie Acre, M.D.  Billy Fischer, M.D  12/20/2015

## 2015-12-21 ENCOUNTER — Inpatient Hospital Stay
Admission: EM | Admit: 2015-12-21 | Discharge: 2015-12-25 | DRG: 885 | Disposition: A | Discharge status: Home / Self Care | Payer: BLUE CROSS/BLUE SHIELD | Source: Intra-hospital | Attending: Psychiatry | Admitting: Psychiatry

## 2015-12-21 DIAGNOSIS — F419 Anxiety disorder, unspecified: Secondary | ICD-10-CM | POA: Diagnosis present

## 2015-12-21 DIAGNOSIS — Z811 Family history of alcohol abuse and dependence: Secondary | ICD-10-CM | POA: Diagnosis not present

## 2015-12-21 DIAGNOSIS — Z87891 Personal history of nicotine dependence: Secondary | ICD-10-CM | POA: Diagnosis not present

## 2015-12-21 DIAGNOSIS — Z8659 Personal history of other mental and behavioral disorders: Secondary | ICD-10-CM

## 2015-12-21 DIAGNOSIS — G4700 Insomnia, unspecified: Secondary | ICD-10-CM | POA: Diagnosis present

## 2015-12-21 DIAGNOSIS — J45909 Unspecified asthma, uncomplicated: Secondary | ICD-10-CM | POA: Diagnosis present

## 2015-12-21 DIAGNOSIS — F331 Major depressive disorder, recurrent, moderate: Principal | ICD-10-CM | POA: Diagnosis present

## 2015-12-21 DIAGNOSIS — T40601A Poisoning by unspecified narcotics, accidental (unintentional), initial encounter: Secondary | ICD-10-CM | POA: Diagnosis present

## 2015-12-21 DIAGNOSIS — Z62810 Personal history of physical and sexual abuse in childhood: Secondary | ICD-10-CM | POA: Diagnosis present

## 2015-12-21 DIAGNOSIS — Z915 Personal history of self-harm: Secondary | ICD-10-CM

## 2015-12-21 DIAGNOSIS — F1120 Opioid dependence, uncomplicated: Secondary | ICD-10-CM | POA: Diagnosis present

## 2015-12-21 DIAGNOSIS — Z91040 Latex allergy status: Secondary | ICD-10-CM | POA: Diagnosis not present

## 2015-12-21 DIAGNOSIS — Z8249 Family history of ischemic heart disease and other diseases of the circulatory system: Secondary | ICD-10-CM

## 2015-12-21 DIAGNOSIS — Z880 Allergy status to penicillin: Secondary | ICD-10-CM

## 2015-12-21 DIAGNOSIS — F1390 Sedative, hypnotic, or anxiolytic use, unspecified, uncomplicated: Secondary | ICD-10-CM | POA: Diagnosis present

## 2015-12-21 DIAGNOSIS — J9503 Malfunction of tracheostomy stoma: Secondary | ICD-10-CM | POA: Diagnosis present

## 2015-12-21 DIAGNOSIS — F1320 Sedative, hypnotic or anxiolytic dependence, uncomplicated: Secondary | ICD-10-CM

## 2015-12-21 DIAGNOSIS — F603 Borderline personality disorder: Secondary | ICD-10-CM

## 2015-12-21 DIAGNOSIS — Z818 Family history of other mental and behavioral disorders: Secondary | ICD-10-CM | POA: Diagnosis not present

## 2015-12-21 DIAGNOSIS — Z79899 Other long term (current) drug therapy: Secondary | ICD-10-CM

## 2015-12-21 DIAGNOSIS — R569 Unspecified convulsions: Secondary | ICD-10-CM | POA: Diagnosis present

## 2015-12-21 DIAGNOSIS — F1590 Other stimulant use, unspecified, uncomplicated: Secondary | ICD-10-CM | POA: Diagnosis present

## 2015-12-21 LAB — GLUCOSE, CAPILLARY: GLUCOSE-CAPILLARY: 137 mg/dL — AB (ref 65–99)

## 2015-12-21 LAB — BASIC METABOLIC PANEL
Anion gap: 5 (ref 5–15)
BUN: 7 mg/dL (ref 6–20)
CHLORIDE: 106 mmol/L (ref 101–111)
CO2: 28 mmol/L (ref 22–32)
CREATININE: 0.67 mg/dL (ref 0.44–1.00)
Calcium: 8.7 mg/dL — ABNORMAL LOW (ref 8.9–10.3)
Glucose, Bld: 86 mg/dL (ref 65–99)
POTASSIUM: 3.5 mmol/L (ref 3.5–5.1)
SODIUM: 139 mmol/L (ref 135–145)

## 2015-12-21 MED ORDER — TRAZODONE HCL 50 MG PO TABS
150.0000 mg | ORAL_TABLET | Freq: Every evening | ORAL | Status: DC | PRN
  Administered 2015-12-22 – 2015-12-24 (×3): 150 mg via ORAL
  Filled 2015-12-21 (×3): qty 1

## 2015-12-21 MED ORDER — DIVALPROEX SODIUM 500 MG PO DR TAB
500.0000 mg | DELAYED_RELEASE_TABLET | Freq: Two times a day (BID) | ORAL | Status: DC
  Administered 2015-12-21: 500 mg via ORAL
  Filled 2015-12-21: qty 1

## 2015-12-21 MED ORDER — LAMOTRIGINE 100 MG PO TABS
200.0000 mg | ORAL_TABLET | Freq: Every day | ORAL | Status: DC
  Administered 2015-12-22 – 2015-12-25 (×4): 200 mg via ORAL
  Filled 2015-12-21 (×4): qty 2

## 2015-12-21 MED ORDER — DIVALPROEX SODIUM 500 MG PO DR TAB
500.0000 mg | DELAYED_RELEASE_TABLET | Freq: Two times a day (BID) | ORAL | Status: DC
  Filled 2015-12-21: qty 1

## 2015-12-21 MED ORDER — DIVALPROEX SODIUM 500 MG PO DR TAB: 500.0000 mg | DELAYED_RELEASE_TABLET | Freq: Two times a day (BID) | ORAL | 2 refills | Status: DC

## 2015-12-21 MED ORDER — IPRATROPIUM-ALBUTEROL 0.5-2.5 (3) MG/3ML IN SOLN: 3.0000 mL | Freq: Four times a day (QID) | RESPIRATORY_TRACT | Status: DC | PRN

## 2015-12-21 MED ORDER — MAGNESIUM HYDROXIDE 400 MG/5ML PO SUSP: 30.0000 mL | Freq: Every day | ORAL | Status: DC | PRN

## 2015-12-21 MED ORDER — VENLAFAXINE HCL ER 75 MG PO CP24
75.0000 mg | ORAL_CAPSULE | Freq: Every day | ORAL | Status: DC
  Administered 2015-12-22 – 2015-12-25 (×4): 75 mg via ORAL
  Filled 2015-12-21 (×4): qty 1

## 2015-12-21 MED ORDER — ACETAMINOPHEN 325 MG PO TABS
650.0000 mg | ORAL_TABLET | Freq: Four times a day (QID) | ORAL | Status: DC | PRN
  Administered 2015-12-21: 650 mg via ORAL
  Filled 2015-12-21: qty 2

## 2015-12-21 MED ORDER — ATORVASTATIN CALCIUM 20 MG PO TABS
20.0000 mg | ORAL_TABLET | Freq: Every day | ORAL | Status: DC
  Administered 2015-12-21 – 2015-12-24 (×4): 20 mg via ORAL
  Filled 2015-12-21 (×4): qty 1

## 2015-12-21 MED ORDER — IPRATROPIUM-ALBUTEROL 0.5-2.5 (3) MG/3ML IN SOLN: 3.0000 mL | Freq: Four times a day (QID) | RESPIRATORY_TRACT | 0 refills | Status: DC | PRN

## 2015-12-21 MED ORDER — ALUM & MAG HYDROXIDE-SIMETH 200-200-20 MG/5ML PO SUSP: 30.0000 mL | ORAL | Status: DC | PRN

## 2015-12-21 NOTE — Progress Notes (Signed)
[facility].  Accepting physician is Dr. Jennet MaduroPucilowska.  Attending Physician will be Dr. Ardyth HarpsHernandez Patient has been assigned to room 302[facility]A, [facility] Nurse HowardGwen.  Call report to [phone].  Pt. Rn on med floor contacted states pt. Is medically cleared, has been notified of bed assignment. Nonnie Pickney K. Sherlon HandingHarris, LCAS-A, LPC-A, [facility]  Counselor 12/21/2015 9:17 AM

## 2015-12-21 NOTE — Discharge Summary (Signed)
[facility]   PATIENT NAME: Patricia [name]    MR#:  [id number]  DATE OF BIRTH:  04/21/1960  DATE OF ADMISSION:  12/18/2015   ADMITTING PHYSICIAN: Shane Crutch, MD  DATE OF DISCHARGE: 12/21/15  PRIMARY CARE PHYSICIAN: Evie Lacks, NP   ADMISSION DIAGNOSIS:   Seizure (HCC) [R56.9] Acute respiratory failure, unspecified whether with hypoxia or hypercapnia (HCC) [J96.00]  DISCHARGE DIAGNOSIS:   Principal Problem:   Bipolar 2 disorder, major depressive episode (HCC) Active Problems:   Opiate overdose   Opioid use disorder, moderate, dependence (HCC)   Suicidal ideation   Involuntary commitment   Seizure (HCC)   SECONDARY DIAGNOSIS:   Past Medical History:  Diagnosis Date  . Alcohol abuse unk  . Anxiety unk  . Aspiration into airway   . Depression unk    HOSPITAL COURSE:   55 year old female with past medical history significant for bipolar disorder, history of tracheal stenosis following tracheostomy, major depression with prior suicidal attempts and multiple psychiatric hospitalizations comes to hospital after Ambien and narcotics overdose. Intubated for airway protection and extubated on 12/19/2015. Continues to be depressed and has a Comptroller. Medically stable for transfer to behavioral medicine unit.  #1 drug overdose-with suicidal ideation. Overdosed on Ambien and opioids. -Extubated, respiratory status is stable. -Sitter at bedside. Suicide precautions. -Labs are stable.  #2 major depression with suicidal ideation-appreciate psychiatry consult. -Patient is medically stable for transfer to psychiatry unit -Her Seroquel and Effexor on hold. -Currently on Depakote and Lamictal  #3 seizure-seizures secondary to decreased seizure threshold from Ambien and narcotics. -No further seizure activity. Discontinue Keppra. -Started on Depakote per neurology recommendations  #4 hypokalemia-improved after  replacement.  Discharge to behavioral medicine unit today  DISCHARGE CONDITIONS:   Guarded'  CONSULTS OBTAINED:   Treatment Team:  Pauletta Browns, MD Shari Prows, MD Enid Baas, MD  DRUG ALLERGIES:   Allergies  Allergen Reactions  . Penicillins Hives    Has patient had a PCN reaction causing immediate rash, facial/tongue/throat swelling, SOB or lightheadedness with hypotension: Yes Has patient had a PCN reaction causing severe rash involving mucus membranes or skin necrosis: No Has patient had a PCN reaction that required hospitalization Yes Has patient had a PCN reaction occurring within the last 10 years: Yes If all of the above answers are "NO", then may proceed with Cephalosporin use.    DISCHARGE MEDICATIONS:     Medication List    STOP taking these medications   doxepin 100 MG capsule Commonly known as:  SINEQUAN   folic acid 1 MG tablet Commonly known as:  FOLVITE   naloxone HCl 4 MG/0.1ML Liqd   QUEtiapine 300 MG tablet Commonly known as:  SEROQUEL   SAPHRIS 5 MG Subl 24 hr tablet Generic drug:  asenapine   venlafaxine XR 150 MG 24 hr capsule Commonly known as:  EFFEXOR-XR     TAKE these medications   atorvastatin 20 MG tablet Commonly known as:  LIPITOR Take 20 mg by mouth at bedtime.   divalproex 500 MG DR tablet Commonly known as:  DEPAKOTE Take 1 tablet (500 mg total) by mouth every 12 (twelve) hours.   ipratropium-albuterol 0.5-2.5 (3) MG/3ML Soln Commonly known as:  DUONEB Take 3 mLs by nebulization every 6 (six) hours as needed.   lamoTRIgine 200 MG tablet Commonly known as:  LAMICTAL Take 1 tablet (200 mg total) by mouth daily.        DISCHARGE INSTRUCTIONS:   1.  Management per psychiatry team  DIET:   Regular diet  ACTIVITY:   Activity as tolerated  OXYGEN:   Home Oxygen: No.  Oxygen Delivery: room air  DISCHARGE LOCATION:   Behavioral medicine unit   If you experience worsening of your  admission symptoms, develop shortness of breath, life threatening emergency, suicidal or homicidal thoughts you must seek medical attention immediately by calling 911 or calling your MD immediately  if symptoms less severe.  You Must read complete instructions/literature along with all the possible adverse reactions/side effects for all the Medicines you take and that have been prescribed to you. Take any new Medicines after you have completely understood and accpet all the possible adverse reactions/side effects.   Please note  You were cared for by a hospitalist during your hospital stay. If you have any questions about your discharge medications or the care you received while you were in the hospital after you are discharged, you can call the unit and asked to speak with the hospitalist on call if the hospitalist that took care of you is not available. Once you are discharged, your primary care physician will handle any further medical issues. Please note that NO REFILLS for any discharge medications will be authorized once you are discharged, as it is imperative that you return to your primary care physician (or establish a relationship with a primary care physician if you do not have one) for your aftercare needs so that they can reassess your need for medications and monitor your lab values.    On the day of Discharge:  VITAL SIGNS:   Blood pressure 136/83, pulse 80, temperature 98.5 F (36.9 C), temperature source Oral, resp. rate 18, height 5\' 4"  (1.626 m), weight 79.7 kg (175 lb 11.2 oz), SpO2 100 %.  PHYSICAL EXAMINATION:    GENERAL:  55 y.o.-year-old patient lying in the bed with no acute distress.  EYES: Pupils equal, round, reactive to light and accommodation. No scleral icterus. Extraocular muscles intact.  HEENT: Head atraumatic, normocephalic. Oropharynx and nasopharynx clear. No oral thrush. NECK:  Supple, no jugular venous distention. No thyroid enlargement, no tenderness.  Hoarse voice. LUNGS: Normal breath sounds bilaterally, no wheezing, rales,rhonchi or crepitation. No use of accessory muscles of respiration.  CARDIOVASCULAR: S1, S2 normal. No murmurs, rubs, or gallops.  ABDOMEN: Soft, non-tender, non-distended. Bowel sounds present. No organomegaly or mass.  EXTREMITIES: No pedal edema, cyanosis, or clubbing.  NEUROLOGIC: Cranial nerves II through XII are intact. Muscle strength 5/5 in all extremities. Sensation intact. Gait not checked.  PSYCHIATRIC: The patient is alert and oriented x 3. Continues to be depressed. Flat affect SKIN: No obvious rash, lesion, or ulcer.   DATA REVIEW:   CBC  Recent Labs Lab 12/20/15 0525  WBC 5.7  HGB 11.5*  HCT 34.3*  PLT 197    Chemistries   Recent Labs Lab 12/18/15 0927 12/19/15 0444 12/20/15 0525 12/21/15 0308  NA 138 139 139 139  K 4.0 3.6 3.0* 3.5  CL 100* 105 106 106  CO2 26 23 23 28   GLUCOSE 114* 95 73 86  BUN 15 16 12 7   CREATININE 0.94 0.92 0.80 0.67  CALCIUM 9.5 8.0* 8.1* 8.7*  MG  --  2.4  --   --   AST 86*  --  101*  --   ALT 34  --  36  --   ALKPHOS 105  --   --   --   BILITOT 0.6  --   --   --  Microbiology Results  Results for orders placed or performed during the hospital encounter of 12/18/15  MRSA PCR Screening     Status: None   Collection Time: 12/18/15 11:29 PM  Result Value Ref Range Status   MRSA by PCR NEGATIVE NEGATIVE Final    Comment:        The GeneXpert MRSA Assay (FDA approved for NASAL specimens only), is one component of a comprehensive MRSA colonization surveillance program. It is not intended to diagnose MRSA infection nor to guide or monitor treatment for MRSA infections.     RADIOLOGY:  No results found.   Management plans discussed with the patient, family and they are in agreement.  CODE STATUS:     Code Status Orders        Start     Ordered   12/18/15 2149  Full code  Continuous     12/18/15 2155    Code Status History     Date Active Date Inactive Code Status Order ID Comments User Context   12/18/2015  8:27 PM 12/18/2015  8:57 PM Full Code [id number]  Audery AmelJohn T Clapacs, MD Inpatient   10/24/2015 12:26 AM 10/30/2015  4:15 PM Full Code [id number]  Audery AmelJohn T Clapacs, MD Inpatient   09/28/2015  5:31 PM 09/30/2015  2:43 PM Full Code [id number]  Audery AmelJohn T Clapacs, MD Inpatient   02/06/2015  6:00 PM 02/08/2015  4:45 PM Full Code [id number]  Shaune PollackQing Chen, MD Inpatient   08/16/2013  4:57 PM 09/03/2013  2:48 PM Full Code [id number]  Maren ReamerSarah Yates Inpatient      TOTAL TIME TAKING CARE OF THIS PATIENT: 37 minutes.    Enid BaasKALISETTI,Dezeray Puccio M.D on 12/21/2015 at 8:17 AM  Between 7am to 6pm - Pager - [phone]  After 6pm go to [email or web address] - Social research officer, governmentpassword [email or web address]  [facility]  Office  [phone]  CC: Primary care physician; Evie LacksElizabeth Pointer, NP   Note: This dictation was prepared with Dragon dictation along with smaller phrase technology. Any transcriptional errors that result from this process are unintentional.

## 2015-12-21 NOTE — Tx Team (Signed)
Initial Treatment Plan 12/21/2015 3:28 PM Chaundra Avel SensorAnn Candelas WUJ:[id number]    PATIENT STRESSORS: Marital or family conflict Medication change or noncompliance   PATIENT STRENGTHS: Ability for insight Capable of independent living Communication skills Supportive family/friends   PATIENT IDENTIFIED PROBLEMS: Suicidal 12/21/15  Major Depression  12/21/15  Seizures 12/21/15                 DISCHARGE CRITERIA:  Ability to meet basic life and health needs Adequate post-discharge living arrangements Improved stabilization in mood, thinking, and/or behavior  PRELIMINARY DISCHARGE PLAN: Outpatient therapy Return to previous living arrangement  PATIENT/FAMILY INVOLVEMENT: This treatment plan has been presented to and reviewed with the patient, Allena EaringSusy Ann Ullom, and/or family member,.  The patient and family have been given the opportunity to ask questions and make suggestions.  Crist InfanteGwen A Shareena Nusz, RN 12/21/2015, 3:28 PM

## 2015-12-21 NOTE — Plan of Care (Signed)
Problem: Coping: Goal: Ability to cope will improve Outcome: Progressing Adapting to unit and staff.

## 2015-12-21 NOTE — [facility] Suicide Risk Assessment (Signed)
[facility]   Nursing information obtained from:    Demographic factors:    Current Mental Status:    Loss Factors:    Historical Factors:    Risk Reduction Factors:     Total Time spent with patient:  Principal Problem: MDD (major depressive disorder), recurrent episode, moderate (HCC) Diagnosis:   Patient Active Problem List   Diagnosis Date Noted  . Borderline personality disorder [F60.3] 12/21/2015  . 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 MDD (major depressive disorder), recurrent episode, moderate (HCC) [F33.1] 12/21/2015  . Seizure (HCC) [R56.9] 12/18/2015  . Opiate overdose [T40.601A] 09/28/2015  . 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 Opioid use disorder, moderate, dependence (HCC) [F11.20] 09/28/2015  . 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 H/O eating disorder [Z86.59] 09/19/2013   Subjective Data:   Continued Clinical Symptoms:  Alcohol Use Disorder Identification Test Final Score (AUDIT): 0 The "Alcohol Use Disorders Identification Test", Guidelines for Use in Primary Care, Second Edition.  World Science writerHealth Organization [facility](WHO). Score between 0-7:  no or low risk or alcohol related problems. Score between 8-15:  moderate risk of alcohol related problems. Score between 16-19:  high risk of alcohol related problems. Score 20 or above:  warrants further diagnostic evaluation for alcohol dependence and treatment.   CLINICAL FACTORS:       Psychiatric Specialty Exam: Physical Exam  ROS  Blood pressure (!) 145/79, pulse 98, temperature 98.8 F (37.1 C), temperature source Oral, resp. rate 18, height 5\' 5"  (1.651 m), weight 73.9 kg (163 lb), SpO2 100 %.Body mass index is 27.12 kg/m.                                                    Sleep:         COGNITIVE FEATURES THAT CONTRIBUTE TO RISK:  Polarized thinking    SUICIDE RISK:   Moderate:   Frequent suicidal ideation with limited intensity, and duration, some specificity in terms of plans, no associated intent, good self-control, limited dysphoria/symptomatology, some risk factors present, and identifiable protective factors, including available and accessible social support.   [facility]  I certify that inpatient services furnished can reasonably be expected to improve the patient's condition.  Jimmy FootmanHernandez-Gonzalez,  Maalle Starrett, MD 12/21/2015, 3:54 PM
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
## 2015-12-21 NOTE — Plan of Care (Signed)
Problem: Medication: Goal: Compliance with prescribed medication regimen will improve Outcome: Not Progressing Pt refuses to take ordered medication Depakote. Stated that she "doesn't have seizures and doesn't need it, and the only reason she took it upstairs is because the nurse up there told her that she couldn't come down here if she didn't take it."

## 2015-12-21 NOTE — Progress Notes (Signed)
Called behavioral med updated on pt status being medically cleared to go to behavioral med. Awaiting bed.

## 2015-12-21 NOTE — Progress Notes (Signed)
D:55 year old white female in under the services of DR. Ardyth HarpsHernandez . [facility].  Multiple suicide attempts , Patient denied substance  Abuse  But is recorded on data sheet. Patient has a long  Of Major Depression . Patient was arguing with husband and  And overdosed on her Ambien . Came to hospital and  Had a seizure moved to ICU was intubated and extubated before coming down to this floor    Pt appeared depressed  With  a flat affect.  Pt voice of suicidal ideations at this time.   A: Pt admitted to unit per protocol, skin assessment and search done and no contraband found.  Pt  educated on therapeutic milieu rules. Pt was introduced to milieu by nursing staff.    R: Pt was receptive to education about the milieu .  15 min safety checks started. Clinical research associatewriter offered support

## 2015-12-21 NOTE — H&P (Addendum)
[facility] Patient calm and cooperative. Sitter at bedside. IVs removed intact. No complaints of pain. Endorces depression/ denies SI/HI. Poison  Control updated.

## 2015-12-21 NOTE — Progress Notes (Signed)
Pt refused 2000 dose of Depakote DR 500 mg stating "i'm not taking that. I don't need it. I don't have seizures. And, the only reason I took it upstairs is because the nurse up there told me that I wouldn't be able to come down here if I didn't take it." Alert and oriented x 4, respirations even and unlabored, gait steady and unassisted, no acute distress noted.

## 2015-12-21 NOTE — Plan of Care (Signed)
Problem: Safety: Goal: Ability to remain free from injury will improve Outcome: Progressing Able to tell writer of suicidal ideations

## 2015-12-22 NOTE — Plan of Care (Signed)
Problem: Education: Goal: Knowledge of  General Education information/materials will improve Outcome: Progressing Aware of unit programing ,medication, disease process.

## 2015-12-22 NOTE — Progress Notes (Signed)
Pt was observed sitting in the dayroom watching television and appropriately interacting with peers. Alert and oriented x 4, respirations even and unlabored, gait steady and unassisted, no acute distress noted. C/o a headache of 7/10 on pain scale. Received PRN Acetaminophen 650 mg as ordered.  Reassessed pts pain level and was 0/10. No further complaints. Denies SI/HI/AVH at this time as well as feeling depressed but does report that is experiencing sadness and an anxiety of 5/10. Did not elaborate on what the source of her anxiety and sadness were. Refused the 2000 dose of Depakote but took 2200 dose of Lipitor. Remains on q15 minute observation checks for safety. Is safe on the unit. Will continue to monitor.

## 2015-12-22 NOTE — [facility] Group Notes (Signed)
[facility] Group Notes:  (Nursing/MHT/Case Management/Adjunct)  Date:  12/22/2015  Time:  5:05 PM  Type of Therapy:  Psychoeducational Skills  Participation Level:  Active  Participation Quality:  Appropriate, Attentive and Sharing  Affect:  Appropriate  Cognitive:  Alert and Appropriate  Insight:  Appropriate  Engagement in Group:  Engaged  Modes of Intervention:  Discussion, Education and Support  Summary of Progress/Problems:  Lynelle SmokeCara Travis Crewe Heathman 12/22/2015, 5:05 PM

## 2015-12-22 NOTE — Progress Notes (Signed)
Pleasant on approach, observed in the day room interacting well with peers, cheerful, A&Ox3, VSS, denied pain, refused Depakote 500 mg as scheduled "I don't have seizures, I told the doctor I am not going to take Depakote! I am refusing it.'
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
## 2015-12-22 NOTE — Progress Notes (Signed)
Recreation Therapy Notes  INPATIENT RECREATION THERAPY ASSESSMENT  Patient Details Name: Patricia [name] MRN: [id number] DOB: 03/23/1960 Today's Date: 12/22/2015  Patient Stressors: Relationship, Other (Comment) (Husband has been sick and pt feels like she is not a good care taker; son moved back home with his girlfriend)  Coping Skills:   Avoidance, Art/Dance, Talking, Music, Sports  Personal Challenges: Anger, Concentration, Decision-Making, Expressing Yourself, Problem-Solving, Self-Esteem/Confidence, Social Interaction, Stress Management, Trusting Others  Leisure Interests (2+):  Individual - Other (Comment) (Dancing, playing basketball)  Awareness of Community Resources:  No  Community Resources:     Current Use:    If no, Barriers?:    Patient Strengths:  Eyes, hair  Patient Identified Areas of Improvement:  Lose 34 pounds, be more trusting  Current Recreation Participation:  Watching TV  Patient Goal for Hospitalization:  Medication stabilization  [place]  [place] Residence:  [place]   Current SI (including self-harm):  No  Current HI:  No  Consent to Intern Participation: N/A   Jacquelynn CreeGreene,Daijanae Rafalski M, LRT/CTRS 12/22/2015, 4:16 PM

## 2015-12-22 NOTE — Progress Notes (Signed)
Recreation Therapy Notes  Date: 10.24.17 Time: 2:00 pm Location: Craft Room  Group Topic: Self-expression  Goal Area(s) Addresses:  Patient will be able to identify a color that represents each emotion. Patient will verbalize benefit of using art as a means of self-expression. Patient will verbalize one emotion experienced while participating in activity.  Behavioral Response: Attentive, Interactive  Intervention: The Colors Within Me  Activity: Patients were give blank face worksheet and were instructed to pick a color for each emotion they were feeling and to show on the worksheet how much of that emotion they were feeling.  Education: LRT educated patients on other forms of self-expression.  Education Outcome: Acknowledges education/In group clarification offered   Clinical Observations/Feedback: Patient completed activity by picking a color for each emotion and showing on the worksheet how much of that emotion she was feeling. Patient contributed to group discussion by stating what emotions she was feeling, how her emotions affect her treatment in the hospital, and that it was helpful to see her emotions on paper.  Jacquelynn CreeGreene,Joliene Salvador M, LRT/CTRS 12/22/2015 4:00 PM

## 2015-12-22 NOTE — BHH Group Notes (Signed)
Goals Group Date/Time: 12/22/2015 9:00 AM Type of Therapy and Topic: Group Therapy: Goals Group: SMART Goals   Participation Level: Moderate  Description of Group:    The purpose of a daily goals group is to assist and guide patients in setting recovery/wellness-related goals. The objective is to set goals as they relate to the crisis in which they were admitted. Patients will be using SMART goal modalities to set measurable goals. Characteristics of realistic goals will be discussed and patients will be assisted in setting and processing how one will reach their goal. Facilitator will also assist patients in applying interventions and coping skills learned in psycho-education groups to the SMART goal and process how one will achieve defined goal.   Therapeutic Goals:   -Patients will develop and document one goal related to or their crisis in which brought them into treatment.  -Patients will be guided by LCSW using SMART goal setting modality in how to set a measurable, attainable, realistic and time sensitive goal.  -Patients will process barriers in reaching goal.  -Patients will process interventions in how to overcome and successful in reaching goal.   Patient's Goal: Pt shared the pt's goal is to "be functioning again" at discharge and reports the pt's goal will be assisted by the pt's reporting she will be with family immediately after discharge.  Pt share a barrier to the pt's goal is her being [place] [facility].    Therapeutic Modalities:  Motivational Interviewing  Research officer, political partyCognitive Behavioral Therapy  Crisis Intervention Model  SMART goals setting   Dorothe PeaJonathan F. Rilya Longo, LCSWA, LCAS
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
## 2015-12-22 NOTE — [facility] Group Notes (Signed)
[facility] Group Notes:  (Nursing/MHT/Case Management/Adjunct)  Date:  12/22/2015  Time:  11:02 PM  Type of Therapy:  Psychoeducational Skills  Participation Level:  Did Not Attend  Participation Quality: Summary of Progress/Problems:  Mayra NeerJackie L Josepha Barbier 12/22/2015, 11:02 PM

## 2015-12-23 NOTE — [facility] Group Notes (Signed)
[facility] Group Notes:  (Nursing/MHT/Case Management/Adjunct)  Date:  12/23/2015  Time:  5:40 PM  Type of Therapy:  Psychoeducational Skills  Participation Level:  Active  Participation Quality:  Appropriate and Attentive  Affect:  Appropriate  Cognitive:  Appropriate  Insight:  Appropriate  Engagement in Group:  Engaged  Modes of Intervention:  Discussion and Education  Summary of Progress/Problems:  Twanna Hymanda C Gesenia Bantz 12/23/2015, 5:40 PM

## 2015-12-23 NOTE — Plan of Care (Signed)
Problem: Safety: Goal: Ability to remain free from injury will improve Outcome: Progressing Trazodone 150 mg PO PRN given for insomnia, 15 minute checks maintained for safety, clinical and moral support provided, patient encouraged to continue to express feelings and demonstrate safe care. Patient remains free from harm, will continue to monitor.

## 2015-12-23 NOTE — Plan of Care (Signed)
Problem: [facility] Participation in Recreation Therapeutic Interventions Goal: STG-Patient will demonstrate improved self esteem by identif STG: Self-Esteem - Within 4 treatment sessions, patient will verbalize at least 5 positive affirmation statements in each of 2 treatment sessions to increase self-esteem post d/c.  Outcome: Progressing Treatment Session 1; Completed 1 out of 2: At approximately 11:30 am, LRT met with patient in patient room. Patient verbalized 5 positive affirmation statements. Patient reported it felt "weird". LRT encouraged patient to continue saying positive affirmation statements.  Leonette Monarch, LRT/CTRS 10.25.17 2:59 pm Goal: STG-Other Recreation Therapy Goal (Specify) STG: Stress Management - Within 4 treatment sessions, patient will verbalize understanding of the stress management techniques in each of 2 treatment sessions to increase stress management skills post d/c.  Outcome: Progressing Treatment Session 1; Completed 1 out of 2: At approximately 11:30 am, LRT met with patient in patient room. LRT educated and provided patient with handouts on stress management techniques. Patient verbalized understanding. LRT encouraged patient to read over and practice the stress management techniques.  Leonette Monarch, LRT/CTRS 10.25.17 3:02 pm

## 2015-12-23 NOTE — Progress Notes (Signed)
Patient reports sleeping good last night with medication. Reports appetite and concentration good. Energy level low. Rates depression at 5 and anxiety at 6. Denies SI, HI, AVH.  Pt med compliant and attends most groups. Encouragement and support offered. Medications given as prescribed. Safety checks maintained. Pt receptive and remains safe on unit with q 15 min checks.

## 2015-12-23 NOTE — [facility] Group Notes (Signed)
[facility] LCSW Group Therapy   12/23/2015  9:30am  Type of Therapy: Group Therapy   Participation Level: Did Not Attend. Patient invited to participate but declined.    Dorothe PeaJonathan F. Helana Macbride, MSW, LCSWA, LCAS

## 2015-12-23 NOTE — Plan of Care (Signed)
Problem: Coping: Goal: Ability to cope will improve Outcome: Progressing Pt reports feeling better. Denies SI, HI, AVH. Resting adequately

## 2015-12-23 NOTE — [facility] Group Notes (Signed)
[facility] Group Notes:  (Nursing/MHT/Case Management/Adjunct)  Date:  12/23/2015  Time:  9:15 PM  Type of Therapy:  Psychoeducational Skills  Participation Level:  Active  Participation Quality:  Appropriate  Affect:  Appropriate  Cognitive:  Alert  Insight:  Good  Engagement in Group:  Engaged  Modes of Intervention:  Activity  Summary of Progress/Problems:  Patricia NeerJackie L Bralin [name] 12/23/2015, 9:15 PM

## 2015-12-23 NOTE — Progress Notes (Signed)
Affect and mood appropriate and bright today; visitation by husband went well. Thoughts are organized, reasonable, makes sense, no FOI or loose association; approaching funtional baseline.Patient denied SI/HI, denied AV/H.

## 2015-12-23 NOTE — Care Management (Signed)
Patient was resting in the bed but asked for a moment so that she could hear me, then turned and faced me while we discussed whether or not she desired any services and how she was feeling today. Patient was warm and seemed to have a good energy in the conversation around how she was feeling and her experience while in our care.

## 2015-12-23 NOTE — Progress Notes (Signed)
Recreation Therapy Notes  Date: 10.25.17 Time: 1:00 pm Location: Craft Room  Group Topic: Self-esteem  Goal Area(s) Addresses:  Patient will write at least one positive trait about self. Patient will verbalize benefit of having a healthy self-esteem.  Behavioral Response: Arrived late, Attentive  Intervention: I Am  Activity: Patients were given a worksheet with the letter I on it and were instructed to write as many positive traits about themselves inside the letter.  Education: LRT educated patients on ways they can increase their self-esteem.  Education Outcome: In group clarification offered   Clinical Observations/Feedback: Patient arrived to group at approximately 2:12 pm. Patient did not contribute to group discussion.  Jacquelynn CreeGreene,Sahej Hauswirth M, LRT/CTRS 12/23/2015 3:32 PM

## 2015-12-23 NOTE — Tx Team (Signed)
Interdisciplinary Treatment and Diagnostic Plan Update  12/23/2015 Time of Session: 10:30am [name] Dennin MRN: [id number]  Principal Diagnosis: MDD (major depressive disorder), recurrent episode, moderate (HCC)  Secondary Diagnoses: Principal Problem:   MDD (major depressive disorder), recurrent episode, moderate (HCC) Active Problems:   H/O eating disorder   Tracheal stenosis following tracheostomy (HCC)   Opiate overdose   Stimulant use disorder   Opioid use disorder, moderate, dependence (HCC)   Seizure (HCC)   Borderline personality disorder   Sedative, hypnotic or anxiolytic use disorder   Current Medications:  Current Facility-Administered Medications  Medication Dose Route Frequency Provider Last Rate Last Dose  . acetaminophen (TYLENOL) tablet 650 mg  650 mg Oral Q6H PRN Jimmy FootmanAndrea Hernandez-Gonzalez, MD   650 mg at 12/21/15 2036  . alum & mag hydroxide-simeth (MAALOX/MYLANTA) 200-200-20 MG/5ML suspension 30 mL  30 mL Oral Q4H PRN Jimmy FootmanAndrea Hernandez-Gonzalez, MD      . atorvastatin (LIPITOR) tablet 20 mg  20 mg Oral QHS Jimmy FootmanAndrea Hernandez-Gonzalez, MD   20 mg at 12/22/15 2127  . divalproex (DEPAKOTE) DR tablet 500 mg  500 mg Oral Q12H Jimmy FootmanAndrea Hernandez-Gonzalez, MD      . ipratropium-albuterol (DUONEB) 0.5-2.5 (3) MG/3ML nebulizer solution 3 mL  3 mL Nebulization Q6H PRN Jimmy FootmanAndrea Hernandez-Gonzalez, MD      . lamoTRIgine (LAMICTAL) tablet 200 mg  200 mg Oral Daily Jimmy FootmanAndrea Hernandez-Gonzalez, MD   200 mg at 12/23/15 0817  . magnesium hydroxide (MILK OF MAGNESIA) suspension 30 mL  30 mL Oral Daily PRN Jimmy FootmanAndrea Hernandez-Gonzalez, MD      . traZODone (DESYREL) tablet 150 mg  150 mg Oral QHS PRN Jimmy FootmanAndrea Hernandez-Gonzalez, MD   150 mg at 12/22/15 2129  . venlafaxine XR (EFFEXOR-XR) 24 hr capsule 75 mg  75 mg Oral Q breakfast Jimmy FootmanAndrea Hernandez-Gonzalez, MD   75 mg at 12/23/15 [id number]   PTA Medications: Prescriptions Prior to Admission  Medication Sig Dispense Refill Last Dose  . atorvastatin  (LIPITOR) 20 MG tablet Take 20 mg by mouth at bedtime.   unknown at unknown  . divalproex (DEPAKOTE) 500 MG DR tablet Take 1 tablet (500 mg total) by mouth every 12 (twelve) hours. 30 tablet 2   . ipratropium-albuterol (DUONEB) 0.5-2.5 (3) MG/3ML SOLN Take 3 mLs by nebulization every 6 (six) hours as needed. 360 mL 0   . lamoTRIgine (LAMICTAL) 200 MG tablet Take 1 tablet (200 mg total) by mouth daily. 30 tablet 1 unknown at unknown    Patient Stressors: Marital or family conflict Medication change or noncompliance  Patient Strengths: Ability for insight Capable of independent living Communication skills Supportive family/friends  Treatment Modalities: Medication Management, Group therapy, Case management,  1 to 1 session with clinician, Psychoeducation, Recreational therapy.   Physician Treatment Plan for Primary Diagnosis: MDD (major depressive disorder), recurrent episode, moderate (HCC) Long Term Goal(s): Improvement in symptoms so as ready for discharge Improvement in symptoms so as ready for discharge   Short Term Goals: Ability to identify changes in lifestyle to reduce recurrence of condition will improve Ability to verbalize feelings will improve Ability to demonstrate self-control will improve Ability to identify and develop effective coping behaviors will improve Ability to identify triggers associated with substance abuse/mental health issues will improve Ability to identify changes in lifestyle to reduce recurrence of condition will improve Ability to demonstrate self-control will improve Ability to identify and develop effective coping behaviors will improve Ability to identify triggers associated with substance abuse/mental health issues will improve  Medication Management: Evaluate patient's  response, side effects, and tolerance of medication regimen.  Therapeutic Interventions: 1 to 1 sessions, Unit Group sessions and Medication administration.  Evaluation of  Outcomes: Progressing  Physician Treatment Plan for Secondary Diagnosis: Principal Problem:   MDD (major depressive disorder), recurrent episode, moderate (HCC) Active Problems:   H/O eating disorder   Tracheal stenosis following tracheostomy (HCC)   Opiate overdose   Stimulant use disorder   Opioid use disorder, moderate, dependence (HCC)   Seizure (HCC)   Borderline personality disorder   Sedative, hypnotic or anxiolytic use disorder  Long Term Goal(s): Improvement in symptoms so as ready for discharge Improvement in symptoms so as ready for discharge   Short Term Goals: Ability to identify changes in lifestyle to reduce recurrence of condition will improve Ability to verbalize feelings will improve Ability to demonstrate self-control will improve Ability to identify and develop effective coping behaviors will improve Ability to identify triggers associated with substance abuse/mental health issues will improve Ability to identify changes in lifestyle to reduce recurrence of condition will improve Ability to demonstrate self-control will improve Ability to identify and develop effective coping behaviors will improve Ability to identify triggers associated with substance abuse/mental health issues will improve     Medication Management: Evaluate patient's response, side effects, and tolerance of medication regimen.  Therapeutic Interventions: 1 to 1 sessions, Unit Group sessions and Medication administration.  Evaluation of Outcomes: Progressing   RN Treatment Plan for Primary Diagnosis: MDD (major depressive disorder), recurrent episode, moderate (HCC) Long Term Goal(s): Knowledge of disease and therapeutic regimen to maintain health will improve  Short Term Goals: Ability to remain free from injury will improve, Ability to verbalize feelings will improve, Ability to disclose and discuss suicidal ideas and Ability to identify and develop effective coping behaviors will  improve  Medication Management: RN will administer medications as ordered by provider, will assess and evaluate patient's response and provide education to patient for prescribed medication. RN will report any adverse and/or side effects to prescribing provider.  Therapeutic Interventions: 1 on 1 counseling sessions, Psychoeducation, Medication administration, Evaluate responses to treatment, Monitor vital signs and CBGs as ordered, Perform/monitor CIWA, COWS, AIMS and Fall Risk screenings as ordered, Perform wound care treatments as ordered.  Evaluation of Outcomes: Progressing   LCSW Treatment Plan for Primary Diagnosis: MDD (major depressive disorder), recurrent episode, moderate (HCC) Long Term Goal(s): Safe transition to appropriate next level of care at discharge, Engage patient in therapeutic group addressing interpersonal concerns.  Short Term Goals: Engage patient in aftercare planning with referrals and resources, Increase social support, Increase ability to appropriately verbalize feelings, Increase emotional regulation, Facilitate patient progression through stages of change regarding substance use diagnoses and concerns, Identify triggers associated with mental health/substance abuse issues and Increase skills for wellness and recovery  Therapeutic Interventions: Assess for all discharge needs, 1 to 1 time with Social worker, Explore available resources and support systems, Assess for adequacy in community support network, Educate family and significant other(s) on suicide prevention, Complete Psychosocial Assessment, Interpersonal group therapy.  Evaluation of Outcomes: Progressing   Progress in Treatment: Attending groups: Yes. Participating in groups: Yes. Taking medication as prescribed: Yes. Toleration medication: Yes. Family/Significant other contact made: Yes, individual(s) contacted:  Orland Jarred Patient understands diagnosis: Yes. Discussing patient identified  problems/goals with staff: Yes. Medical problems stabilized or resolved: Yes. Denies suicidal/homicidal ideation: Yes. Issues/concerns per patient self-inventory: Yes. Other:    New problem(s) identified: No, Describe:     New Short Term/Long Term Goal(s):  Discharge Plan or Barriers: Discharge Home and follow up with [facility] and private therapist in [place]  Reason for Continuation of Hospitalization: Anxiety Depression Medication stabilization  Estimated Length of Stay: 2 days  Attendees: Patient:Patricia [name] 12/23/2015 5:34 PM  Physician: Sue Lush hernandez,MD 12/23/2015 5:34 PM  Nursing: Leonia Reader, RN 12/23/2015 5:34 PM  RN Care Manager: 12/23/2015 5:34 PM  Social Worker: Jake Shark, LCSW 12/23/2015 5:34 PM  Recreational Therapist: Hershal Coria, LRT 12/23/2015 5:34 PM  Other:  12/23/2015 5:34 PM  Other:  12/23/2015 5:34 PM  Other: 12/23/2015 5:34 PM    Scribe for Treatment Team: Glennon Mac, LCSW 12/23/2015 5:34 PM

## 2015-12-23 NOTE — [facility] Group Notes (Addendum)
[facility] LCSW Group Therapy   12/22/2015 9:30am *Late Entry  Type of Therapy: Group Therapy   Participation Level: Active   Participation Quality: Attentive, Sharing and Supportive   Affect: Appropriate  Cognitive: Alert and Oriented   Insight: Developing/Improving and Engaged   Engagement in Therapy: Developing/Improving and Engaged   Modes of Intervention: Clarification, Confrontation, Discussion, Education, Exploration,  Limit-setting, Orientation, Problem-solving, Rapport Building, Dance movement psychotherapisteality Testing, Socialization and Support  Summary of Progress/Problems: The topic for group therapy was feelings about diagnosis. Pt actively participated in group discussion on their past and current diagnosis and how they feel towards this. Pt also identified how society and family members judge them, based on their diagnosis as well as stereotypes and stigmas. Pt shared that the pt was diagnosed as depressed and with ADD.  Pt shared the pt agrees with the pt's diagnosis.  Pt shared the pt feels "judged" by others, as a result of the pt's diagnosis, but that this not a barrier to the pt's recovery.  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  Pt was polite and cooperative with the CSW and other group members and focused and attentive to the topics discussed and the sharing of others.     Dorothe PeaJonathan F. Zinnia Tindall, LCSWA, LCAS
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
## 2015-12-23 NOTE — [facility] Suicide Risk Assessment (Signed)
[facility] INPATIENT:  Family/Significant Other Suicide Prevention Education  Suicide Prevention Education:  Education Completed; Patricia [name], [phone],  (name of family member/significant other) has been identified by the patient as the family member/significant other with whom the patient will be residing, and identified as the person(s) who will aid the patient in the event of a mental health crisis (suicidal ideations/suicide attempt).  With written consent from the patient, the family member/significant other has been provided the following suicide prevention education, prior to the and/or following the discharge of the patient.  The suicide prevention education provided includes the following:  Suicide risk factors  Suicide prevention and interventions  National Suicide Hotline telephone number  [facility] assessment telephone number  [facility] Emergency Assistance 911  [facility] and/or Residential Mobile Crisis Unit telephone number  Request made of family/significant other to:  Remove weapons (e.g., guns, rifles, knives), all items previously/currently identified as safety concern.    Remove drugs/medications (over-the-counter, prescriptions, illicit drugs), all items previously/currently identified as a safety concern.  The family member/significant other verbalizes understanding of the suicide prevention education information provided.  The family member/significant other agrees to remove the items of safety concern listed above.  Patricia [name], MSW, LCSW 12/23/2015, 4:23 PM

## 2015-12-24 MED ORDER — TRAZODONE HCL 150 MG PO TABS: 150.0000 mg | ORAL_TABLET | Freq: Every evening | ORAL | 0 refills | Status: DC | PRN

## 2015-12-24 MED ORDER — VENLAFAXINE HCL ER 150 MG PO CP24: 150.0000 mg | ORAL_CAPSULE | Freq: Every day | ORAL | 0 refills | Status: DC

## 2015-12-24 NOTE — Plan of Care (Signed)
Problem: Safety: Goal: Periods of time without injury will increase Outcome: Progressing Patient has remained free from injury this shift.   

## 2015-12-24 NOTE — [facility] Group Notes (Signed)
Goals Group  Date/Time: 12/24/2015 9am  Type of Therapy and Topic: Group Therapy: Goals Group: SMART Goals   Pt was called, but did not attend   Leara Rawl F. Sol Englert, LCSWA, LCAS  
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
## 2015-12-24 NOTE — Progress Notes (Signed)
Recreation Therapy Notes  Date: 10.26.17 Time: 1:00 pm Location: Craft Room  Group Topic: Leisure Education  Goal Area(s) Addresses:  Patient will identify activities for each letter of the alphabet. Patient will verbalize ability to integrate positive leisure into life post d/c. Patient will verbalize ability to use leisure as a Associate Professorcoping skill.  Behavioral Response: Attentive, Interactive  Intervention: Leisure Alphabet  Activity: Patients were given a Leisure Information systems managerAlphabet worksheet and were instructed to write healthy leisure activities for each letter of the alphabet.  Education: LRT educated patients on what they need to participate in leisure.  Education Outcome: Acknowledges education/In group clarification offered  Clinical Observations/Feedback: Patient completed activity by writing healthy leisure activities. Patient contributed to group discussion by stating some of her healthy leisure activities, what she needs to participate in leisure, and how she can integrate leisure into her schedule.  Jacquelynn CreeGreene,Karinda Cabriales M, LRT/CTRS 12/24/2015 1:55 PM

## 2015-12-24 NOTE — Progress Notes (Signed)
D:  Patient denies SI/AVH/HI.  Patient has been irritable with staff for most of the day and has remained isolative to her room, preferring to stay in bed instead of attending group sessions. A:  Patient provided support and encouragement.  Patient administered scheduled medications. R:  Patient safety maintained with 15 minute checks.  Patient provided with a safe environment.

## 2015-12-24 NOTE — Plan of Care (Signed)
Problem: [facility] Participation in Recreation Therapeutic Interventions Goal: STG-Patient will demonstrate improved self esteem by identif STG: Self-Esteem - Within 4 treatment sessions, patient will verbalize at least 5 positive affirmation statements in each of 2 treatment sessions to increase self-esteem post d/c.  Outcome: Completed/Met Date Met: 12/24/15 Treatment Session 2; Completed 2 out of 2: At approximately 11:25 am, LRT met with patient in patient room. Patient verbalized 5 positive affirmation statements. Patient reported it felt "new". LRT encouraged patient to continue saying positive affirmation statements.  Leonette Monarch, LRT/CTRS 10.26.17 3:44 pm Goal: STG-Other Recreation Therapy Goal (Specify) STG: Stress Management - Within 4 treatment sessions, patient will verbalize understanding of the stress management techniques in each of 2 treatment sessions to increase stress management skills post d/c.  Outcome: Completed/Met Date Met: 12/24/15 Treatment Session 2; Completed 2 out of 2: At approximately 11:25 am, LRT met with patient in patient room. Patient reported she read over and practiced the stress management techniques. Patient verbalized understanding and reported the techniques were helpful. LRT encouraged patient to continue practicing the stress management techniques.  Leonette Monarch, LRT/CTRS 10.26.17 3:46 pm

## 2015-12-24 NOTE — [facility] Group Notes (Signed)
[facility] Group Notes:  (Nursing/MHT/Case Management/Adjunct)  Date:  12/24/2015  Time:  4:12 PM  Type of Therapy:  Psychoeducational Skills  Participation Level:  Did Not Attend  Twanna Hymanda C Odie Rauen 12/24/2015, 4:12 PM

## 2015-12-24 NOTE — Plan of Care (Signed)
Problem: Safety: Goal: Ability to remain free from injury will improve Outcome: Progressing Trazodone 150 mg PO, PRN given for sleep, 15 minute checks maintained for safety, clinical and moral support provided, patient encouraged to continue to express feelings and demonstrate safe care. Patient remains free from harm, estimated sleep hours is 7 hours and 10 minutes; will continue to monitor.

## 2015-12-24 NOTE — [facility] Group Notes (Signed)
[facility] LCSW Group Therapy   12/24/2015  9:30am  Type of Therapy: Group Therapy   Participation Level: Did Not Attend. Patient invited to participate but declined.    Sayre Witherington F. Arden Axon, MSW, LCSWA, LCAS     
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
## 2015-12-25 ENCOUNTER — Encounter: Payer: Self-pay | Admitting: Psychiatry

## 2015-12-25 NOTE — Tx Team (Signed)
Interdisciplinary Treatment and Diagnostic Plan Update  12/25/2015 Time of Session: 10:30am Patricia [name] MRN: [id number]  Principal Diagnosis: MDD (major depressive disorder), recurrent episode, moderate (HCC)  Secondary Diagnoses: Principal Problem:   MDD (major depressive disorder), recurrent episode, moderate (HCC) Active Problems:   H/O eating disorder   Tracheal stenosis following tracheostomy (HCC)   Opiate overdose   Stimulant use disorder   Opioid use disorder, moderate, dependence (HCC)   Seizure (HCC)   Borderline personality disorder   Sedative, hypnotic or anxiolytic use disorder   Current Medications:  Current Facility-Administered Medications  Medication Dose Route Frequency Provider Last Rate Last Dose  . acetaminophen (TYLENOL) tablet 650 mg  650 mg Oral Q6H PRN Jimmy FootmanAndrea Hernandez-Gonzalez, MD   650 mg at 12/21/15 2036  . alum & mag hydroxide-simeth (MAALOX/MYLANTA) 200-200-20 MG/5ML suspension 30 mL  30 mL Oral Q4H PRN Jimmy FootmanAndrea Hernandez-Gonzalez, MD      . atorvastatin (LIPITOR) tablet 20 mg  20 mg Oral QHS Jimmy FootmanAndrea Hernandez-Gonzalez, MD   20 mg at 12/24/15 2202  . ipratropium-albuterol (DUONEB) 0.5-2.5 (3) MG/3ML nebulizer solution 3 mL  3 mL Nebulization Q6H PRN Jimmy FootmanAndrea Hernandez-Gonzalez, MD      . lamoTRIgine (LAMICTAL) tablet 200 mg  200 mg Oral Daily Jimmy FootmanAndrea Hernandez-Gonzalez, MD   200 mg at 12/25/15 0940  . magnesium hydroxide (MILK OF MAGNESIA) suspension 30 mL  30 mL Oral Daily PRN Jimmy FootmanAndrea Hernandez-Gonzalez, MD      . traZODone (DESYREL) tablet 150 mg  150 mg Oral QHS PRN Jimmy FootmanAndrea Hernandez-Gonzalez, MD   150 mg at 12/24/15 2202  . venlafaxine XR (EFFEXOR-XR) 24 hr capsule 75 mg  75 mg Oral Q breakfast Jimmy FootmanAndrea Hernandez-Gonzalez, MD   75 mg at 12/25/15 0940   PTA Medications: Prescriptions Prior to Admission  Medication Sig Dispense Refill Last Dose  . atorvastatin (LIPITOR) 20 MG tablet Take 20 mg by mouth at bedtime.   unknown at unknown  . divalproex  (DEPAKOTE) 500 MG DR tablet Take 1 tablet (500 mg total) by mouth every 12 (twelve) hours. 30 tablet 2   . ipratropium-albuterol (DUONEB) 0.5-2.5 (3) MG/3ML SOLN Take 3 mLs by nebulization every 6 (six) hours as needed. 360 mL 0   . lamoTRIgine (LAMICTAL) 200 MG tablet Take 1 tablet (200 mg total) by mouth daily. 30 tablet 1 unknown at unknown    Patient Stressors: Marital or family conflict Medication change or noncompliance  Patient Strengths: Ability for insight Capable of independent living Communication skills Supportive family/friends  Treatment Modalities: Medication Management, Group therapy, Case management,  1 to 1 session with clinician, Psychoeducation, Recreational therapy.   Physician Treatment Plan for Primary Diagnosis: MDD (major depressive disorder), recurrent episode, moderate (HCC) Long Term Goal(s): Improvement in symptoms so as ready for discharge Improvement in symptoms so as ready for discharge   Short Term Goals: Ability to identify changes in lifestyle to reduce recurrence of condition will improve Ability to verbalize feelings will improve Ability to demonstrate self-control will improve Ability to identify and develop effective coping behaviors will improve Ability to identify triggers associated with substance abuse/mental health issues will improve Ability to identify changes in lifestyle to reduce recurrence of condition will improve Ability to demonstrate self-control will improve Ability to identify and develop effective coping behaviors will improve Ability to identify triggers associated with substance abuse/mental health issues will improve  Medication Management: Evaluate patient's response, side effects, and tolerance of medication regimen.  Therapeutic Interventions: 1 to 1 sessions, Unit Group sessions and Medication  administration.  Evaluation of Outcomes: Adequate for Discharge  Physician Treatment Plan for Secondary Diagnosis: Principal  Problem:   MDD (major depressive disorder), recurrent episode, moderate (HCC) Active Problems:   H/O eating disorder   Tracheal stenosis following tracheostomy (HCC)   Opiate overdose   Stimulant use disorder   Opioid use disorder, moderate, dependence (HCC)   Seizure (HCC)   Borderline personality disorder   Sedative, hypnotic or anxiolytic use disorder  Long Term Goal(s): Improvement in symptoms so as ready for discharge Improvement in symptoms so as ready for discharge   Short Term Goals: Ability to identify changes in lifestyle to reduce recurrence of condition will improve Ability to verbalize feelings will improve Ability to demonstrate self-control will improve Ability to identify and develop effective coping behaviors will improve Ability to identify triggers associated with substance abuse/mental health issues will improve Ability to identify changes in lifestyle to reduce recurrence of condition will improve Ability to demonstrate self-control will improve Ability to identify and develop effective coping behaviors will improve Ability to identify triggers associated with substance abuse/mental health issues will improve     Medication Management: Evaluate patient's response, side effects, and tolerance of medication regimen.  Therapeutic Interventions: 1 to 1 sessions, Unit Group sessions and Medication administration.  Evaluation of Outcomes: Adequate for Discharge   RN Treatment Plan for Primary Diagnosis: MDD (major depressive disorder), recurrent episode, moderate (HCC) Long Term Goal(s): Knowledge of disease and therapeutic regimen to maintain health will improve  Short Term Goals: Ability to remain free from injury will improve, Ability to verbalize feelings will improve, Ability to disclose and discuss suicidal ideas and Ability to identify and develop effective coping behaviors will improve  Medication Management: RN will administer medications as ordered by  provider, will assess and evaluate patient's response and provide education to patient for prescribed medication. RN will report any adverse and/or side effects to prescribing provider.  Therapeutic Interventions: 1 on 1 counseling sessions, Psychoeducation, Medication administration, Evaluate responses to treatment, Monitor vital signs and CBGs as ordered, Perform/monitor CIWA, COWS, AIMS and Fall Risk screenings as ordered, Perform wound care treatments as ordered.  Evaluation of Outcomes: Adequate for Discharge   LCSW Treatment Plan for Primary Diagnosis: MDD (major depressive disorder), recurrent episode, moderate (HCC) Long Term Goal(s): Safe transition to appropriate next level of care at discharge, Engage patient in therapeutic group addressing interpersonal concerns.  Short Term Goals: Engage patient in aftercare planning with referrals and resources, Increase social support, Increase ability to appropriately verbalize feelings, Increase emotional regulation, Facilitate patient progression through stages of change regarding substance use diagnoses and concerns, Identify triggers associated with mental health/substance abuse issues and Increase skills for wellness and recovery  Therapeutic Interventions: Assess for all discharge needs, 1 to 1 time with Social worker, Explore available resources and support systems, Assess for adequacy in community support network, Educate family and significant other(s) on suicide prevention, Complete Psychosocial Assessment, Interpersonal group therapy.  Evaluation of Outcomes: Adequate for Discharge   Progress in Treatment: Attending groups: Yes. Participating in groups: Yes. Taking medication as prescribed: Yes. Toleration medication: Yes. Family/Significant other contact made: Yes, individual(s) contacted:  husband Patient understands diagnosis: Yes. Discussing patient identified problems/goals with staff: Yes. Medical problems stabilized or  resolved: Yes. Denies suicidal/homicidal ideation: Yes. Issues/concerns per patient self-inventory: No. Other: n/a  New problem(s) identified: None identified at this time.   New Short Term/Long Term Goal(s): None identified at this time.   Discharge Plan or Barriers: Patient will discharge  home to live with her husband and has follow-up with [facility] and [facility] psychiatric associates.   Reason for Continuation of Hospitalization: Discharge 12/25/2015  Estimated Length of Stay: Discharge 12/25/2015  Attendees: Patient: Patricia [name] 12/25/2015 11:18 AM  Physician: Dr. Jimmy Footman, MD 12/25/2015 11:18 AM  Nursing: Leonia Reader, RN 12/25/2015 11:18 AM  RN Care Manager: 12/25/2015 11:18 AM  Social Worker: Fredrich Birks. Garnette Czech MSW, LCSWA  12/25/2015 11:18 AM  Recreational Therapist: Jacquelynn Cree, LRT/CTRS 12/25/2015 11:18 AM  Other:  12/25/2015 11:18 AM  Other:  12/25/2015 11:18 AM  Other: 12/25/2015 11:18 AM    Scribe for Treatment Team: Arelia Longest, LCSWA 12/25/2015 11:38 AM

## 2015-12-25 NOTE — Progress Notes (Signed)
  [facility] :  Will you be returning to the same living situation after discharge:  Yes,  patient will be discharging home to live with her husband.  At discharge, do you have transportation home?: Yes,  husband Do you have the ability to pay for your medications: Yes,  patient has insurance  Release of information consent forms completed and in the chart;  Patient's signature needed at discharge.  Patient to Follow up at: Follow-up Information    [facility] Follow up on 12/28/2015.   Why:  Please arrive for walk-in follow-up medication management appointment at 8am. Please bring your ID, insurance information and current medications.  Contact information: [address] [place] [address] [phone] [facility]       [facility] Follow up on 01/08/2016.   Why:  Please arrive to your appointment with Dr. Nida BoatmanBrad at 1pm on this date. Please bring current medications and discharge summary. Dr. Nida BoatmanBrad can refer you to a marriage counselor during your visit with him on this date.  Contact information: [address] [place], [place] [address] P - [phone]          Next level of care provider has [facility] to [facility] Link:no  Safety Planning and Suicide Prevention discussed: Yes,  with husband  Have you used any form of tobacco in the last 30 days? (Cigarettes, Smokeless Tobacco, Cigars, and/or Pipes): No  Has patient been referred to the Quitline?: N/A patient is not a smoker  Patient has been referred for addiction treatment: Yes  Rome Schlauch G. Garnette CzechSampson MSW, LCSWA 12/25/2015, 11:18 AM

## 2015-12-25 NOTE — Progress Notes (Signed)
Patient discharged home. DC instructions provided and explained. Medications reviewed. Rx given. All questions answered. Pt stable at discharge. Denies SI, HI, AVH. All belongings returned. Transition and Risk assessment provided to patient.

## 2015-12-25 NOTE — Progress Notes (Signed)
Recreation Therapy Notes  INPATIENT RECREATION TR PLAN  Patient Details Name: Patricia [name] MRN: [id number] DOB: [birth year] Today's Date: 12/25/2015  Rec Therapy Plan Is patient appropriate for Therapeutic Recreation?: Yes Treatment times per week: At least once a week TR Treatment/Interventions: 1:1 session, Group participation (Comment) (Appropriate participation in daily recreational therapy tx)  Discharge Criteria Pt will be discharged from therapy if:: Treatment goals are met, Discharged Treatment plan/goals/alternatives discussed and agreed upon by:: Patient/family  Discharge Summary Short term goals set: See Care Plan Short term goals met: Complete Progress toward goals comments: One-to-one attended Which groups?: Leisure education, Other (Comment) (Self-expression) One-to-one attended: Self-esteem, stress management Reason goals not met: N/A Therapeutic equipment acquired: None Reason patient discharged from therapy: Discharge from hospital Pt/family agrees with progress & goals achieved: Yes Date patient discharged from therapy: 12/25/15   Leonette Monarch, LRT/CTRS 12/25/2015, 2:47 PM

## 2015-12-25 NOTE — [facility] Group Notes (Signed)
[facility] Group Notes:  (Nursing/MHT/Case Management/Adjunct)  Date:  12/25/2015  Time:  1:38 AM  Type of Therapy:  Psychoeducational Skills  Participation Level:  Active  Participation Quality:  Appropriate  Affect:  Appropriate  Cognitive:  Appropriate  Insight:  Appropriate and Good  Engagement in Group:  Engaged  Modes of Intervention:  Discussion, Socialization and Support  Summary of Progress/Problems:  Chancy MilroyLaquanda Y Gerlene Glassburn 12/25/2015, 1:38 AM

## 2015-12-25 NOTE — Plan of Care (Signed)
Problem: Education: Goal: Knowledge of the prescribed therapeutic regimen will improve Outcome: Progressing Pt was knowledgeable about medication regimen.

## 2015-12-25 NOTE — Progress Notes (Signed)
D: Observed pt in the dayroom interacting with peers. Patient alert and oriented x4. Patient denies SI/HI/AVH. Pt affect is appropriate, but expression could be sad at times. Pt stated her day was "Not bad...happy I'm going home." Pt rated depression 4/10 and anxiety 5/10. Pt mentioned that post discharge she will start "going to meetings [AA,NA...]...husband will stop taking opiates."  A: Offered active listening and support. Provided therapeutic communication. Administered scheduled medications.  R: Pt pleasant and cooperative. Pt medication compliant. Will continue Q15 min. checks. Safety maintained.

## 2018-10-14 ENCOUNTER — Other Ambulatory Visit: Payer: Self-pay

## 2018-10-14 ENCOUNTER — Emergency Department
Admission: EM | Admit: 2018-10-14 | Discharge: 2018-10-14 | Disposition: A | Discharge status: Home / Self Care | Payer: Medicare HMO | Attending: Emergency Medicine | Admitting: Emergency Medicine

## 2018-10-14 ENCOUNTER — Encounter: Payer: Self-pay | Admitting: Emergency Medicine

## 2018-10-14 DIAGNOSIS — Z79899 Other long term (current) drug therapy: Secondary | ICD-10-CM | POA: Diagnosis not present

## 2018-10-14 DIAGNOSIS — R45851 Suicidal ideations: Secondary | ICD-10-CM | POA: Insufficient documentation

## 2018-10-14 DIAGNOSIS — F32A Depression, unspecified: Secondary | ICD-10-CM

## 2018-10-14 DIAGNOSIS — F10929 Alcohol use, unspecified with intoxication, unspecified: Secondary | ICD-10-CM | POA: Insufficient documentation

## 2018-10-14 DIAGNOSIS — Z87891 Personal history of nicotine dependence: Secondary | ICD-10-CM | POA: Insufficient documentation

## 2018-10-14 DIAGNOSIS — F1014 Alcohol abuse with alcohol-induced mood disorder: Secondary | ICD-10-CM | POA: Diagnosis not present

## 2018-10-14 DIAGNOSIS — F329 Major depressive disorder, single episode, unspecified: Secondary | ICD-10-CM

## 2018-10-14 DIAGNOSIS — F603 Borderline personality disorder: Secondary | ICD-10-CM | POA: Diagnosis present

## 2018-10-14 LAB — COMPREHENSIVE METABOLIC PANEL
ALT: 27 U/L (ref 0–44)
AST: 34 U/L (ref 15–41)
Albumin: 4 g/dL (ref 3.5–5.0)
Alkaline Phosphatase: 130 U/L — ABNORMAL HIGH (ref 38–126)
Anion gap: 12 (ref 5–15)
BUN: 11 mg/dL (ref 6–20)
CO2: 22 mmol/L (ref 22–32)
Calcium: 8.8 mg/dL — ABNORMAL LOW (ref 8.9–10.3)
Chloride: 107 mmol/L (ref 98–111)
Creatinine, Ser: 0.65 mg/dL (ref 0.44–1.00)
GFR calc Af Amer: >60 mL/min (ref >60)
GFR calc non Af Amer: >60 mL/min (ref >60)
Glucose, Bld: 89 mg/dL (ref 70–99)
Potassium: 3.8 mmol/L (ref 3.5–5.1)
Sodium: 141 mmol/L (ref 135–145)
Total Bilirubin: 0.4 mg/dL (ref 0.3–1.2)
Total Protein: 7.5 g/dL (ref 6.5–8.1)

## 2018-10-14 LAB — URINE DRUG SCREEN, QUALITATIVE (ARMC ONLY)
Amphetamines, Ur Screen: NOT DETECTED
Barbiturates, Ur Screen: NOT DETECTED
Benzodiazepine, Ur Scrn: NOT DETECTED
Cannabinoid 50 Ng, Ur ~~LOC~~: NOT DETECTED
Cocaine Metabolite,Ur ~~LOC~~: NOT DETECTED
MDMA (Ecstasy)Ur Screen: NOT DETECTED
Methadone Scn, Ur: NOT DETECTED
Opiate, Ur Screen: NOT DETECTED
Phencyclidine (PCP) Ur S: NOT DETECTED
Tricyclic, Ur Screen: NOT DETECTED

## 2018-10-14 LAB — CBC
HCT: 37.8 % (ref 36.0–46.0)
Hemoglobin: 11.9 g/dL — ABNORMAL LOW (ref 12.0–15.0)
MCH: 28.5 pg (ref 26.0–34.0)
MCHC: 31.5 g/dL (ref 30.0–36.0)
MCV: 90.6 fL (ref 80.0–100.0)
Platelets: 264 10*3/uL (ref 150–400)
RBC: 4.17 MIL/uL (ref 3.87–5.11)
RDW: 14.5 % (ref 11.5–15.5)
WBC: 6.8 10*3/uL (ref 4.0–10.5)
nRBC: 0 % (ref 0.0–0.2)

## 2018-10-14 LAB — SALICYLATE LEVEL: Salicylate Lvl: <7 mg/dL (ref 2.8–30.0)

## 2018-10-14 LAB — ETHANOL: Alcohol, Ethyl (B): 202 mg/dL — ABNORMAL HIGH (ref <10)

## 2018-10-14 LAB — ACETAMINOPHEN LEVEL: Acetaminophen (Tylenol), Serum: <10 ug/mL — ABNORMAL LOW (ref 10–30)

## 2018-10-14 MED ORDER — SODIUM CHLORIDE 0.9 % IV BOLUS
1000.0000 mL | Freq: Once | INTRAVENOUS | Status: AC
  Administered 2018-10-14: 1000 mL via INTRAVENOUS

## 2018-10-14 MED ORDER — THIAMINE HCL 100 MG/ML IJ SOLN
100.0000 mg | Freq: Once | INTRAMUSCULAR | Status: AC
  Administered 2018-10-14: 100 mg via INTRAVENOUS
  Filled 2018-10-14: qty 2

## 2018-10-14 NOTE — ED Notes (Signed)
Pt property returned to pt.

## 2018-10-14 NOTE — ED Triage Notes (Addendum)
Pt brought to Rm 26 from home by [facility] with report of patient called 911 saying she had stabbed herself in the chest. Pt has no stab wounds to her chest. Pt appears to be intoxicated and per [employer] the husband reports pt has been drinking. Deputy had to "wrestle a large steak knife" away from the patient. Pt arrived in handcuff that Deputy Walker removed. Pt stood and transferred to bed with assistance of deputy. Pt proceeded to lay down on the bed and go to sleep. Refused to answer questions. Pt arrives with IVC papers.

## 2018-10-14 NOTE — ED Provider Notes (Signed)
[facility] Emergency Department Provider Note  ____________________________________________   First MD Initiated Contact with Patient 10/14/18 0128     (approximate)  I have reviewed the triage vital signs and the nursing notes.   HISTORY  Chief Complaint Alcohol Intoxication and Suicidal  Level 5 caveat:  history/ROS limited by acute intoxication and lack of cooperation.  HPI Patricia [name] is a 58 y.o. female with a medical and psychiatric history as listed below with multiple prior episodes of intentional overdose and suicidal ideation, at least once requiring intubation.  She presents tonight by EMS for suicidal ideation and alcohol intoxication.  Reportedly she was trying to kill herself at home, holding a kitchen knife to her chest, which had to be wrestled away from her by law enforcement.  Once she got to the emergency department she is uncooperative and will not participate in any physical exam or history.  She does not appear to be in any distress but will not answer any questions.  She is protecting her airway.        Past Medical History:  Diagnosis Date  . Alcohol abuse unk  . Anxiety unk  . Aspiration into airway   . Depression unk    Patient Active Problem List   Diagnosis Date Noted  . Borderline personality disorder (HCC) 12/21/2015  . 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 MDD (major depressive disorder), recurrent episode, moderate (HCC) 12/21/2015  . Seizure (HCC) 12/18/2015  . Opiate overdose (HCC) 09/28/2015  . Stimulant use disorder 09/28/2015  . Opioid use disorder, moderate, dependence (HCC) 09/28/2015  . 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 H/O eating disorder 09/19/2013    Past Surgical History:  Procedure Laterality Date  . ABDOMINAL HYSTERECTOMY    . FOOT SURGERY    . TRACHEAL SURGERY      Prior to Admission medications   Medication Sig Start Date End Date Taking?  Authorizing Provider  atorvastatin (LIPITOR) 20 MG tablet Take 20 mg by mouth at bedtime.    [provider]  ipratropium-albuterol (DUONEB) 0.5-2.5 (3) MG/3ML SOLN Take 3 mLs by nebulization every 6 (six) hours as needed. 12/21/15   Enid BaasKalisetti, Radhika, MD  lamoTRIgine (LAMICTAL) 200 MG tablet Take 1 tablet (200 mg total) by mouth daily. 10/29/15   Pucilowska, Braulio ConteJolanta B, MD  traZODone (DESYREL) 150 MG tablet Take 1 tablet (150 mg total) by mouth at bedtime as needed for sleep. 12/24/15   Jimmy FootmanHernandez-Gonzalez, Andrea, MD  venlafaxine XR (EFFEXOR-XR) 150 MG 24 hr capsule Take 1 capsule (150 mg total) by mouth daily with breakfast. 12/25/15   Jimmy FootmanHernandez-Gonzalez, Andrea, MD    Allergies Penicillins and Latex  Family History  Problem Relation Age of Onset  . Breast cancer Mother   . Hypertension Mother   . Depression Mother   . Anxiety disorder Mother   . Alcohol abuse Father   . Alcohol abuse Sister   . Anxiety disorder Sister   . Bipolar disorder Sister     Social History Social History   Tobacco Use  . Smoking status: Former Smoker    Types: Cigarettes  . Smokeless tobacco: Never Used  Substance Use Topics  . Alcohol use: No    Comment: former alcoholic "sober for a month now"  . Drug use: No    Review of Systems Level 5 caveat:  history/ROS limited by acute intoxication and lack of cooperation.  ____________________________________________   PHYSICAL EXAM:  VITAL SIGNS: ED Triage Vitals  Enc Vitals Group     BP 10/14/18 0054 124/78     Pulse Rate 10/14/18 0054 (!) 104     Resp 10/14/18 0054 12     Temp 10/14/18 0054 98.4 F (36.9 C)     Temp Source 10/14/18 0054 Axillary     SpO2 10/14/18 0054 94 %     Weight 10/14/18 0055 77.1 kg (170 lb)     Height 10/14/18 0055 1.626 m (5\' 4" )     Head Circumference --      Peak Flow --      Pain Score 10/14/18 0054 Asleep     Pain Loc --      Pain Edu? --      Excl. in [place]? --     Constitutional: Awake and  responds somewhat to stimuli but is generally uncooperative. Eyes: Conjunctivae are normal.  Head: Atraumatic. Nose: No congestion/rhinnorhea. Mouth/Throat: Patient will not open mouth to assess. Neck: No stridor.  No meningeal signs.   Cardiovascular: Normal rate, regular rhythm. Good peripheral circulation. Grossly normal heart sounds. Respiratory: Normal respiratory effort.  No retractions. Gastrointestinal: Soft and nontender. No distention.  Musculoskeletal: No lower extremity tenderness nor edema. No gross deformities of extremities. Neurologic: Moving all 4 extremities and turning away from me in the exam room but will not participate in neurological exam. Skin:  Skin is warm, dry and intact. Psychiatric: Mood and affect difficult to assess given the patient's lack of cooperation, but reportedly she was suicidal and holding a knife to her chest at home.  ____________________________________________   LABS (all labs ordered are listed, but only abnormal results are displayed)  Labs Reviewed  COMPREHENSIVE METABOLIC PANEL - Abnormal; Notable for the following components:      Result Value   Calcium 8.8 (*)    Alkaline Phosphatase 130 (*)    All other components within normal limits  ETHANOL - Abnormal; Notable for the following components:   Alcohol, Ethyl (B) 202 (*)    All other components within normal limits  ACETAMINOPHEN LEVEL - Abnormal; Notable for the following components:   Acetaminophen (Tylenol), Serum <10 (*)    All other components within normal limits  CBC - Abnormal; Notable for the following components:   Hemoglobin 11.9 (*)    All other components within normal limits  SALICYLATE LEVEL  URINE DRUG SCREEN, QUALITATIVE ([facility] ONLY)   ____________________________________________  EKG  ED ECG REPORT I, Hinda Kehr, the attending physician, personally viewed and interpreted this ECG.  Date: 10/14/2018 EKG Time: 00: 56 Rate: 103 Rhythm: Sinus  tachycardia QRS Axis: normal Intervals: normal ST/T Wave abnormalities: T wave inversions in leads III and aVF as well as V3 and V4 and V5. Narrative Interpretation: no definitive evidence of acute ischemia; does not meet STEMI criteria.  T waves were not present in prior EKG but the last one on record was from 2017.   ____________________________________________  RADIOLOGY I, Hinda Kehr, personally viewed and evaluated these images (plain radiographs) as part of my medical decision making, as well as reviewing the written report by the radiologist.  ED MD interpretation: No indication for imaging  Official radiology report(s): No results found.  ____________________________________________   PROCEDURES   Procedure(s) performed (including Critical Care):  Procedures   ____________________________________________   INITIAL IMPRESSION / MDM / [place] / ED COURSE  As part of my medical decision making, I reviewed the following data within the [facility] notes reviewed and incorporated, Labs  reviewed , EKG interpreted , Old EKG reviewed, Old chart reviewed and Notes from prior ED visits   Differential diagnosis includes, but is not limited to, alcohol intoxication, substance abuse in general, depression, suicidal ideation.  The patient has been seen multiple times in the past for intentional drug overdose in an attempt to kill herself and I personally intubated her about 3 years ago after a suicide attempt.  She is high risk for self-harm.  She is currently uncooperative and there is no point in getting a tele-psychiatry evaluation.  I have ordered a psychiatric consult and placed her under involuntary commitment.  Lab work is pending.  She is in no acute distress at this time and refusing to answer about any symptoms but she does not appear to be in distress.  Anticipate psychiatric disposition.      Clinical Course as of Oct 13 520  Wynelle LinkSun  Oct 14, 2018  [id number] Normal UDS  Urine Drug Screen, Qualitative [CF]  [phone] Alcohol, Ethyl (B)(!): 202 [CF]  [phone] Lab work is reassuring with no acute abnormalities and negative drug screen, only the ethanol level is elevated.  Patient received a liter of fluids for a borderline hypotensive pressure but she has been consistent in sleeping.  No acute distress.  Cleared for psychiatric evaluation and admission.   [CF]    Clinical Course User Index [CF] Loleta RoseForbach, Vanecia Limpert, MD     ____________________________________________  FINAL CLINICAL IMPRESSION(S) / ED DIAGNOSES  Final diagnoses:  Alcoholic intoxication with complication (HCC)  Suicidal ideation  Depression, unspecified depression type     MEDICATIONS GIVEN DURING THIS VISIT:  Medications  thiamine (B-1) injection 100 mg (100 mg Intravenous Given 10/14/18 0211)  sodium chloride 0.9 % bolus 1,000 mL (0 mLs Intravenous Stopped 10/14/18 0331)  sodium chloride 0.9 % bolus 1,000 mL (1,000 mLs Intravenous New Bag/Given 10/14/18 0425)     ED Discharge Orders    None      *Please note:  Patricia [name] was evaluated in Emergency Department on 10/14/2018 for the symptoms described in the history of present illness. She was evaluated in the context of the global COVID-19 pandemic, which necessitated consideration that the patient might be at risk for infection with the SARS-CoV-2 virus that causes COVID-19. Institutional protocols and algorithms that pertain to the evaluation of patients at risk for COVID-19 are in a state of rapid change based on information released by regulatory bodies including the CDC and federal and state organizations. These policies and algorithms were followed during the patient's care in the ED.  Some ED evaluations and interventions may be delayed as a result of limited staffing during the pandemic.*  Note:  This document was prepared using Dragon voice recognition software and may include unintentional dictation  errors.   Loleta RoseForbach, Melvin Whiteford, MD 10/14/18 [phone]

## 2018-10-14 NOTE — Consult Note (Addendum)
[facility]  10/14/2018 12:34 PM Patricia [name] [place]  MRN:  [id number] Principal Problem: Alcohol abuse with alcohol-induced mood disorder [facility](HCC) Discharge Diagnoses: Principal Problem:   Alcohol abuse with alcohol-induced mood disorder (HCC) Active Problems:   Borderline personality disorder (HCC)  Subjective: "I'm fine.  I just got really depressed thinking my husband was sick and don't want him to die before me.  I was ok and then I wasn't."  She was drinking (not her norm) and became unreasonable.  Husband confirms this (see below).  Patient seen and evaluated in person face-to-face by this provider.  She was asleep earlier this morning and returned around non.  Patient clear and coherent, reports she was doing "ok" until she and her husband decided to drink last night.  She drank liquor instead of beer which appeared to change her mood drastically.  Her husband tried to get her to go to bed but she grabbed a knife and threatened to harm herself as she thought he was going to die and leave her alone, history of borderline personality with intense fear of being alone.  Came to the ED, given IV fluids, and slept.  On assessment, she denies suicidal ideations and does not feel she needs to be admitted.  Contributes her actions to her drinking liquor last night.  Permission to call her husband.  Collateral information obtained from her husband, Patricia [name].  He confirms her story of not typically drinking especially liquor.  He feels the alcohol is what facilitated her actions and when he coughed last night, she became fearful that he had COVID and was going to die.  She has an ongoing fear of him dying and her being left alone.  He reports they have been married for over 30 years and "I'm use to it, doesn't faze me" in regards to her behavior.  No safety concerns and recommended removing knives from the home and any other dangerous item along with the liquor.  He wants to come and get her.  Calm  and rational on the phone.  HPI:  58 yo female presented to the ED after drinking and getting upset with threats to hurt herself.  History of substance abuse and depression.  Past admissions, last one in 2017.  Patient of Dr Debby FreibergBradon and stable until yesterday when she started drinking, BAL 202.  Past opiate and stimulant abuse/dependence.    Total Time spent with patient: 1 hour  Past Psychiatric History: substance abuse, depression, anxiety  Past Medical History:  Past Medical History:  Diagnosis Date  . Alcohol abuse unk  . Anxiety unk  . Aspiration into airway   . Depression unk    Past Surgical History:  Procedure Laterality Date  . ABDOMINAL HYSTERECTOMY    . FOOT SURGERY    . TRACHEAL SURGERY     Family History:  Family History  Problem Relation Age of Onset  . Breast cancer Mother   . Hypertension Mother   . Depression Mother   . Anxiety disorder Mother   . Alcohol abuse Father   . Alcohol abuse Sister   . Anxiety disorder Sister   . Bipolar disorder Sister    Family Psychiatric  History: see above Social History:  Social History   Substance and Sexual Activity  Alcohol Use No   Comment: former alcoholic "sober for a month now"     Social History   Substance and Sexual Activity  Drug Use No    Social History  Socioeconomic History  . Marital status: Married    Spouse name: Not on file  . Number of children: Not on file  . Years of education: Not on file  . Highest education level: Not on file  Occupational History  . Not on file  Social Needs  . Financial resource strain: Not on file  . Food insecurity    Worry: Not on file    Inability: Not on file  . Transportation needs    Medical: Not on file    Non-medical: Not on file  Tobacco Use  . Smoking status: Former Smoker    Types: Cigarettes  . Smokeless tobacco: Never Used  Substance and Sexual Activity  . Alcohol use: No    Comment: former alcoholic "sober for a month now"  . Drug use: No   . Sexual activity: Not Currently  Lifestyle  . Physical activity    Days per week: Not on file    Minutes per session: Not on file  . Stress: Not on file  Relationships  . Social Musicianconnections    Talks on phone: Not on file    Gets together: Not on file    Attends religious service: Not on file    Active member of club or organization: Not on file    Attends meetings of clubs or organizations: Not on file    Relationship status: Not on file  Other Topics Concern  . Not on file  Social History Narrative  . Not on file    Has this patient used any form of tobacco in the last 30 days? (Cigarettes, Smokeless Tobacco, Cigars, and/or Pipes) denies   Current Medications: No current facility-administered medications for this encounter.    Current Outpatient Medications  Medication Sig Dispense Refill  . atorvastatin (LIPITOR) 40 MG tablet Take 40 mg by mouth at bedtime.     . gabapentin (NEURONTIN) 600 MG tablet Take 600 mg by mouth at bedtime.    . lamoTRIgine (LAMICTAL) 200 MG tablet Take 1 tablet (200 mg total) by mouth daily. 30 tablet 1  . omeprazole (PRILOSEC) 40 MG capsule Take 40 mg by mouth daily.    . traZODone (DESYREL) 100 MG tablet Take 300 mg by mouth at bedtime.    Marland Kitchen. venlafaxine XR (EFFEXOR-XR) 75 MG 24 hr capsule Take 75 mg by mouth daily with breakfast.     PTA Medications: (Not in a hospital admission)   Musculoskeletal: Strength & Muscle Tone: within normal limits Gait & Station: normal Patient leans: N/A  Psychiatric Specialty Exam: Physical Exam  Nursing note and vitals reviewed. Constitutional: She is oriented to person, place, and time. She appears well-developed and well-nourished.  HENT:  Head: Normocephalic.  Neck: Normal range of motion.  Respiratory: Effort normal.  Musculoskeletal: Normal range of motion.  Neurological: She is alert and oriented to person, place, and time.  Psychiatric: Her speech is normal and behavior is normal. Judgment and  thought content normal. Her mood appears anxious. Cognition and memory are normal. She exhibits a depressed mood.    Review of Systems  Psychiatric/Behavioral: Positive for depression and substance abuse. The patient is nervous/anxious.   All other systems reviewed and are negative.   Blood pressure (!) 142/70, pulse (!) 101, temperature 98.4 F (36.9 C), temperature source Axillary, resp. rate 13, height 5\' 4"  (1.626 m), weight 77.1 kg, SpO2 98 %.Body mass index is 29.18 kg/m.  General Appearance: Casual  Eye Contact:  Good  Speech:  Normal Rate  Volume:  Normal  Mood:  Anxious and Depressed  Affect:  Congruent  Thought Process:  Coherent and Descriptions of Associations: Intact  Orientation:  Full (Time, Place, and Person)  Thought Content:  WDL and Logical  Suicidal Thoughts:  No  Homicidal Thoughts:  No  Memory:  Immediate;   Good Recent;   Good Remote;   Good  Judgement:  Fair  Insight:  Good  Psychomotor Activity:  Decreased  Concentration:  Concentration: Good and Attention Span: Good  Recall:  Good  Fund of Knowledge:  Good  Language:  Good  Akathisia:  No  Handed:  Right  AIMS (if indicated):     Assets:  Housing Intimacy Leisure Time Physical Health Resilience Social Support  ADL's:  Intact  Cognition:  WNL  Sleep:        Demographic Factors:  NA  Loss Factors: NA  Historical Factors: NA  Risk Reduction Factors:   Sense of responsibility to family, Living with another person, especially a relative, Positive social support and Positive therapeutic relationship  Continued Clinical Symptoms:  Depression and anxiety, mild  Cognitive Features That Contribute To Risk:  None    Suicide Risk:  Minimal: No identifiable suicidal ideation.  Patients presenting with no risk factors but with morbid ruminations; may be classified as minimal risk based on the severity of the depressive symptoms   Plan Of Care/Follow-up recommendations:  Alcohol abuse  with alcohol induced mood disorder: -IV fluids provided with thiamine -Quiet room to promote sleep -Follow up with Dr Janace Hoard  Discussed safety plan along with crisis numbers Activity:  as tolerated Diet:  heart healthy diet  Disposition: discharge home with her husband Waylan Boga, NP 10/14/2018, 12:34 PM

## 2018-10-14 NOTE — ED Notes (Signed)
Assisted patient to the toilet. Notified Calvin, TTS pt was awake.

## 2018-10-14 NOTE — ED Notes (Signed)
Pt dressed out in hospital provided attire. Pt belongings of DL, one yellow ring, two silver rings, one pink shirt, one pair gray shorts, and one pair brown and black slides placed in belongings bag and locked up securely.

## 2018-10-14 NOTE — ED Notes (Signed)
Pt given water to drink and pt able to drink water without difficulty. MD Karma Greaser aware.

## 2018-10-14 NOTE — Discharge Instructions (Addendum)
Follow up with Dr Lita Mains, outpatient provider Living With Depression Everyone experiences occasional disappointment, sadness, and loss in their lives. When you are feeling down, blue, or sad for at least 2 weeks in a row, it may mean that you have depression. Depression can affect your thoughts and feelings, relationships, daily activities, and physical health. It is caused by changes in the way your brain functions. If you receive a diagnosis of depression, your health care provider will tell you which type of depression you have and what treatment options are available to you. If you are living with depression, there are ways to help you recover from it and also ways to prevent it from coming back. How to cope with lifestyle changes Coping with stress     Stress is your bodys reaction to life changes and events, both good and bad. Stressful situations may include:  Getting married.  The death of a spouse.  Losing a job.  Retiring.  Having a baby. Stress can last just a few hours or it can be ongoing. Stress can play a major role in depression, so it is important to learn both how to cope with stress and how to think about it differently. Talk with your health care provider or a counselor if you would like to learn more about stress reduction. He or she may suggest some stress reduction techniques, such as:  Music therapy. This can include creating music or listening to music. Choose music that you enjoy and that inspires you.  Mindfulness-based meditation. This kind of meditation can be done while sitting or walking. It involves being aware of your normal breaths, rather than trying to control your breathing.  Centering prayer. This is a kind of meditation that involves focusing on a spiritual word or phrase. Choose a word, phrase, or sacred image that is meaningful to you and that brings you peace.  Deep breathing. To do this, expand your stomach and inhale slowly through your  nose. Hold your breath for 3-5 seconds, then exhale slowly, allowing your stomach muscles to relax.  Muscle relaxation. This involves intentionally tensing muscles then relaxing them. Choose a stress reduction technique that fits your lifestyle and personality. Stress reduction techniques take time and practice to develop. Set aside 5-15 minutes a day to do them. Therapists can offer training in these techniques. The training may be covered by some insurance plans. Other things you can do to manage stress include:  Keeping a stress diary. This can help you learn what triggers your stress and ways to control your response.  Understanding what your limits are and saying no to requests or events that lead to a schedule that is too full.  Thinking about how you respond to certain situations. You may not be able to control everything, but you can control how you react.  Adding humor to your life by watching funny films or TV shows.  Making time for activities that help you relax and not feeling guilty about spending your time this way.  Medicines Your health care provider may suggest certain medicines if he or she feels that they will help improve your condition. Avoid using alcohol and other substances that may prevent your medicines from working properly (may interact). It is also important to:  Talk with your pharmacist or health care provider about all the medicines that you take, their possible side effects, and what medicines are safe to take together.  Make it your goal to take part in all treatment decisions (  shared decision-making). This includes giving input on the side effects of medicines. It is best if shared decision-making with your health care provider is part of your total treatment plan. If your health care provider prescribes a medicine, you may not notice the full benefits of it for 4-8 weeks. Most people who are treated for depression need to be on medicine for at least 6-12  months after they feel better. If you are taking medicines as part of your treatment, do not stop taking medicines without first talking to your health care provider. You may need to have the medicine slowly decreased (tapered) over time to decrease the risk of harmful side effects. Relationships Your health care provider may suggest family therapy along with individual therapy and drug therapy. While there may not be family problems that are causing you to feel depressed, it is still important to make sure your family learns as much as they can about your mental health. Having your familys support can help make your treatment successful. How to recognize changes in your condition Everyone has a different response to treatment for depression. Recovery from major depression happens when you have not had signs of major depression for two months. This may mean that you will start to:  Have more interest in doing activities.  Feel less hopeless than you did 2 months ago.  Have more energy.  Overeat less often, or have better or improving appetite.  Have better concentration. Your health care provider will work with you to decide the next steps in your recovery. It is also important to recognize when your condition is getting worse. Watch for these signs:  Having fatigue or low energy.  Eating too much or too little.  Sleeping too much or too little.  Feeling restless, agitated, or hopeless.  Having trouble concentrating or making decisions.  Having unexplained physical complaints.  Feeling irritable, angry, or aggressive. Get help as soon as you or your family members notice these symptoms coming back. How to get support and help from others How to talk with friends and family members about your condition  Talking to friends and family members about your condition can provide you with one way to get support and guidance. Reach out to trusted friends or family members, explain your  symptoms to them, and let them know that you are working with a health care provider to treat your depression. Financial resources Not all insurance plans cover mental health care, so it is important to check with your insurance carrier. If paying for co-pays or counseling services is a problem, search for a local or county mental health care center. They may be able to offer public mental health care services at low or no cost when you are not able to see a private health care provider. If you are taking medicine for depression, you may be able to get the generic form, which may be less expensive. Some makers of prescription medicines also offer help to patients who cannot afford the medicines they need. Follow these instructions at home:   Get the right amount and quality of sleep.  Cut down on using caffeine, tobacco, alcohol, and other potentially harmful substances.  Try to exercise, such as walking or lifting small weights.  Take over-the-counter and prescription medicines only as told by your health care provider.  Eat a healthy diet that includes plenty of vegetables, fruits, whole grains, low-fat dairy products, and lean protein. Do not eat a lot of foods that are high  in solid fats, added sugars, or salt.  Keep all follow-up visits as told by your health care provider. This is important. Contact a health care provider if:  You stop taking your antidepressant medicines, and you have any of these symptoms: ? Nausea. ? Headache. ? Feeling lightheaded. ? Chills and body aches. ? Not being able to sleep (insomnia).  You or your friends and family think your depression is getting worse. Get help right away if:  You have thoughts of hurting yourself or others. If you ever feel like you may hurt yourself or others, or have thoughts about taking your own life, get help right away. You can go to your nearest emergency department or call:  Your local emergency services (911 in the  [place]  A suicide crisis helpline, such as the National Suicide Prevention Lifeline at 640-199-85791-(587) 237-1995. This is open 24-hours a day. Summary  If you are living with depression, there are ways to help you recover from it and also ways to prevent it from coming back.  Work with your health care team to create a management plan that includes counseling, stress management techniques, and healthy lifestyle habits. This information is not intended to replace advice given to you by your health care provider. Make sure you discuss any questions you have with your health care provider. Document Released: 01/18/2016 Document Revised: 06/08/2018 Document Reviewed: 01/18/2016 Elsevier Patient Education  2020 [employer]

## 2018-10-15 ENCOUNTER — Telehealth: Payer: Self-pay | Admitting: Emergency Medicine

## 2019-04-16 DIAGNOSIS — G2581 Restless legs syndrome: Secondary | ICD-10-CM | POA: Diagnosis present

## 2019-04-16 DIAGNOSIS — K219 Gastro-esophageal reflux disease without esophagitis: Secondary | ICD-10-CM | POA: Insufficient documentation

## 2019-08-08 ENCOUNTER — Other Ambulatory Visit: Payer: Self-pay

## 2019-08-08 ENCOUNTER — Emergency Department
Admission: EM | Admit: 2019-08-08 | Discharge: 2019-08-08 | Discharge status: Against Medical Advice | Payer: Medicare HMO | Attending: Emergency Medicine | Admitting: Emergency Medicine

## 2019-08-08 DIAGNOSIS — F1010 Alcohol abuse, uncomplicated: Secondary | ICD-10-CM | POA: Diagnosis not present

## 2019-08-08 DIAGNOSIS — Z87891 Personal history of nicotine dependence: Secondary | ICD-10-CM | POA: Diagnosis not present

## 2019-08-08 DIAGNOSIS — Y908 Blood alcohol level of 240 mg/100 ml or more: Secondary | ICD-10-CM | POA: Insufficient documentation

## 2019-08-08 DIAGNOSIS — F329 Major depressive disorder, single episode, unspecified: Secondary | ICD-10-CM | POA: Insufficient documentation

## 2019-08-08 DIAGNOSIS — Z91040 Latex allergy status: Secondary | ICD-10-CM | POA: Insufficient documentation

## 2019-08-08 DIAGNOSIS — Z5320 Procedure and treatment not carried out because of patient's decision for unspecified reasons: Secondary | ICD-10-CM | POA: Insufficient documentation

## 2019-08-08 DIAGNOSIS — Z79899 Other long term (current) drug therapy: Secondary | ICD-10-CM | POA: Diagnosis not present

## 2019-08-08 DIAGNOSIS — F32A Depression, unspecified: Secondary | ICD-10-CM

## 2019-08-08 DIAGNOSIS — R45851 Suicidal ideations: Secondary | ICD-10-CM | POA: Diagnosis not present

## 2019-08-08 LAB — CBC
HCT: 45.2 % (ref 36.0–46.0)
Hemoglobin: 15.4 g/dL — ABNORMAL HIGH (ref 12.0–15.0)
MCH: 31.2 pg (ref 26.0–34.0)
MCHC: 34.1 g/dL (ref 30.0–36.0)
MCV: 91.7 fL (ref 80.0–100.0)
Platelets: 264 10*3/uL (ref 150–400)
RBC: 4.93 MIL/uL (ref 3.87–5.11)
RDW: 14.9 % (ref 11.5–15.5)
WBC: 4.9 10*3/uL (ref 4.0–10.5)
nRBC: 0 % (ref 0.0–0.2)

## 2019-08-08 LAB — COMPREHENSIVE METABOLIC PANEL
ALT: 33 U/L (ref 0–44)
AST: 55 U/L — ABNORMAL HIGH (ref 15–41)
Albumin: 4.6 g/dL (ref 3.5–5.0)
Alkaline Phosphatase: 97 U/L (ref 38–126)
Anion gap: 12 (ref 5–15)
BUN: 9 mg/dL (ref 6–20)
CO2: 28 mmol/L (ref 22–32)
Calcium: 9.3 mg/dL (ref 8.9–10.3)
Chloride: 102 mmol/L (ref 98–111)
Creatinine, Ser: 0.67 mg/dL (ref 0.44–1.00)
GFR calc Af Amer: >60 mL/min (ref >60)
GFR calc non Af Amer: >60 mL/min (ref >60)
Glucose, Bld: 103 mg/dL — ABNORMAL HIGH (ref 70–99)
Potassium: 4.2 mmol/L (ref 3.5–5.1)
Sodium: 142 mmol/L (ref 135–145)
Total Bilirubin: 0.5 mg/dL (ref 0.3–1.2)
Total Protein: 8.8 g/dL — ABNORMAL HIGH (ref 6.5–8.1)

## 2019-08-08 LAB — ACETAMINOPHEN LEVEL: Acetaminophen (Tylenol), Serum: <10 ug/mL — ABNORMAL LOW (ref 10–30)

## 2019-08-08 LAB — SALICYLATE LEVEL: Salicylate Lvl: <7 mg/dL — ABNORMAL LOW (ref 7.0–30.0)

## 2019-08-08 LAB — ETHANOL: Alcohol, Ethyl (B): 272 mg/dL — ABNORMAL HIGH (ref <10)

## 2019-08-08 NOTE — ED Notes (Signed)
Patient is hard of hearing, speak loudly per patient

## 2019-08-08 NOTE — ED Notes (Addendum)
Pt arrives following SI attempt per Husband, Husband with pt at this time. Pt clinging to husband and tearful at this time

## 2019-08-08 NOTE — ED Notes (Signed)
Pt is voluntary Pt walking out yelling to husband lets go I want to go home. Pt advised that EDP Derrill Kay was coming to see her. Pt states she doesn't care and wants to go home. Husband at side trying to get pt to stay to get checked out. Pt walks out door. EDP and husband walk outside to talk with pt.

## 2019-08-08 NOTE — ED Triage Notes (Signed)
Pt attempted to commit suicide 4 days ago but was unsuccessful. Pt tried today as well with husband's gun but could not load the gun. Pt denies suicidal thoughts today.

## 2019-08-08 NOTE — ED Notes (Signed)
[facility] Papers faxed to magistrate and magistrate stated they would contact the Sheriff's Dept to go and pick up patient.  As patient left with husband aster talking to Dr. Derrill Kay and would not stay

## 2019-08-08 NOTE — ED Notes (Signed)
Patient became upset that the doctor has not come and seen her yet and she told writer she was leaving and began walking to waiting room. Writer informed MD Derrill Kay and first nurse Lelon Mast that patient would be coming her way

## 2019-08-08 NOTE — ED Notes (Signed)
Patient assigned to appropriate care area   Introduced self to pt  Patient oriented to unit/care area: Informed that, for their safety, care areas are designed for safety and visiting and phone hours explained to patient. Patient verbalizes understanding, and verbal contract for safety obtained  Environment secured    Patient stated she was suicidal four days ago, but denies being SI today. Patient says she has been dealing with depression her entire life. Patient said she either had to be here or go to jail so she came here

## 2019-08-08 NOTE — ED Provider Notes (Signed)
[facility] Emergency Department Provider Note  ____________________________________________   I have reviewed the triage vital signs and the nursing notes.   HISTORY  Chief Complaint Suicide Attempt   History limited by and level 5 caveat due to: Not cooperative   HPI Patricia [name] is a 59 y.o. female who presents to the emergency department today accompanied by husband because of concerns for suicidal ideation.  Patient had initially been roomed here in the ED however prior to my evaluation she had walked outside.  My history and exam was somewhat limited to patient's uncooperativeness.  Some history was obtained from husband who is with patient at the time of my exam.  Patient does state that she had thoughts of wanting to harm herself 4 days ago.  She does have a long history of depression and says that when she talked to her therapist they advised her to come to the emergency department for evaluation.  Husband additionally states that he found her with his gun.  Furthermore the patient is a self-proclaimed alcoholic and states that she last drank last night.  Records reviewed. Per medical record review patient has a history of depression, alcohol abuse.  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 Authorizing Provider  atorvastatin (LIPITOR) 40 MG tablet Take 40 mg by mouth at bedtime.     [provider]  gabapentin (NEURONTIN) 600 MG tablet Take 600 mg by mouth at bedtime.    [provider]  lamoTRIgine (LAMICTAL) 200 MG tablet Take 1 tablet (200 mg total) by mouth daily. 10/29/15   Pucilowska, Jolanta B, MD  omeprazole (PRILOSEC) 40 MG capsule Take 40 mg by mouth daily.    [provider]  traZODone (DESYREL) 100 MG tablet Take 300 mg by mouth at bedtime.    [provider]  venlafaxine XR (EFFEXOR-XR) 75 MG 24 hr capsule Take 75 mg by mouth daily with breakfast.    [provider]    Allergies Penicillins and Latex  Family History  Problem Relation Age of Onset   Breast cancer Mother    Hypertension Mother    Depression Mother    Anxiety disorder Mother    Alcohol abuse Father    Alcohol abuse Sister    Anxiety disorder Sister    Bipolar disorder Sister     Social History Social History   Tobacco Use   Smoking status: Former Smoker    Types: Cigarettes   Smokeless tobacco: Never Used  Substance Use Topics   Alcohol use: No    Comment: former alcoholic "sober for a month now"  Drug use: No    Review of Systems Unable to obtain secondary to uncooperativeness ____________________________________________   PHYSICAL EXAM:  VITAL SIGNS: ED Triage Vitals  Enc Vitals Group     BP 08/08/19 1656 (!) 145/92     Pulse Rate 08/08/19 1656 99     Resp 08/08/19 1656 18     Temp 08/08/19 1656 98.8 F (37.1 C)     Temp Source 08/08/19 1656 Oral     SpO2 08/08/19 1656 100 %     Weight 08/08/19 1654 130 lb (59 kg)     Height 08/08/19 1654 5\' 5"  (1.651 m)     Head Circumference --      Peak Flow --      Pain Score  08/08/19 1656 0   Constitutional: Awake and alert.  Eyes: Conjunctivae are normal.  ENT      Head: Normocephalic Respiratory: Normal respiratory effort without tachypnea nor retractions.  Musculoskeletal: Normal range of motion in all extremities.  Neurologic:  Normal speech and language.  Skin:  Skin is warm, dry and intact. No rash noted. Psychiatric: Endorses SI  ____________________________________________    LABS (pertinent positives/negatives)  Ethanol 272 Acetaminophen, salicylate, below threshold CBC wbc 4.9, hgb 15.4, plt 264 CMP wnl except glu 103, ca 8.8, ast 55  ____________________________________________   EKG  None  ____________________________________________    RADIOLOGY  None  ____________________________________________   PROCEDURES  Procedures  ____________________________________________   INITIAL IMPRESSION / ASSESSMENT AND PLAN / ED COURSE  Pertinent labs & imaging results that were available during my care of the patient were reviewed by me and considered in my medical decision making (see chart for details).   Patient was evaluated outside of the hospital after she had walked out while I was with a critical care patient.  Talking the patient she states that she has had suicidal thoughts.  The husband stated that he found her with her gun.  She spoke to her therapist who felt like she should be in the hospital to be evaluated.  Patient states she has a history of depression and suicidal ideation intermittently since the age of 75.  Patient was also found to have an elevated ethanol level.  Given history of SI and concerning history do think patient should be evaluated by psychiatrist.  Discussed this with both the patient and the husband.  Patient verbalized that she wanted to leave however at this time I do not feel it is safe for the patient to leave without further evaluation.  I did take out paperwork but during that time the patient  apparently was driven away from the hospital by the husband.  ____________________________________________   FINAL CLINICAL IMPRESSION(S) / ED DIAGNOSES  Final diagnoses:  Depression, unspecified depression type  Suicidal ideation  Alcohol abuse     Note: This dictation was prepared with Dragon dictation. Any transcriptional errors that result from this process are unintentional     14, MD 08/08/19 1942

## 2019-08-08 NOTE — ED Notes (Addendum)
Pt belongings:  1 Multi color shirt 1 pair of blue shorts 1 pair of orange shoes 1 grey bra 1 black mask with pink writing

## 2019-08-09 ENCOUNTER — Telehealth: Payer: Self-pay | Admitting: Emergency Medicine

## 2019-08-09 NOTE — Telephone Encounter (Signed)
Made contract with patients husband Patricia [name] [phone].  He is currently with the patient, he reassures that the patient is resting/ sleeping comfortably and is in a much better mental state than she was last night and that she is safe.  He has already reached out to the patient therapist and discussed options for appropriate treatment option.   Informed Mr.Cast that [facility] was also informed and will be making contact also .  Reached out to EDP [employer] programmer) who then reached out to EDP Derrill Kay to clarify and updated him on this new information.  He will not be pursing any further petitions for this patient. Due to the above information stated from the phone conversation that this writer had with the patients husband.

## 2019-08-09 NOTE — Telephone Encounter (Signed)
IVC papers faxed after the patient eloped with her husband. Papers sent to magistrate and was told to ED staff today that a custody was granted last night and sheriff responded to known address with failed attempt in contacting the patient and or husband.  But also told today from the courts that there is no record of a custody order granted.  The options that were stated from the courts to ED.  If we want the IVC papers to stand we will have to resubmit the petition.  Or have [facility] from [facility] do a wellness check on this patient.   Spoke with Mobile [facility] Jinger Neighbors  [phone]).  Information discussed and Mr. Mitzie Na will attempt to make contact with patient.

## 2019-09-15 ENCOUNTER — Emergency Department
Admission: EM | Admit: 2019-09-15 | Discharge: 2019-09-15 | Disposition: A | Discharge status: Home / Self Care | Payer: Medicare HMO | Attending: Emergency Medicine | Admitting: Emergency Medicine

## 2019-09-15 ENCOUNTER — Other Ambulatory Visit: Payer: Self-pay

## 2019-09-15 ENCOUNTER — Encounter: Payer: Self-pay | Admitting: Emergency Medicine

## 2019-09-15 DIAGNOSIS — F603 Borderline personality disorder: Secondary | ICD-10-CM | POA: Diagnosis not present

## 2019-09-15 DIAGNOSIS — Z046 Encounter for general psychiatric examination, requested by authority: Secondary | ICD-10-CM | POA: Diagnosis not present

## 2019-09-15 DIAGNOSIS — F419 Anxiety disorder, unspecified: Secondary | ICD-10-CM | POA: Diagnosis present

## 2019-09-15 DIAGNOSIS — Z91040 Latex allergy status: Secondary | ICD-10-CM | POA: Diagnosis not present

## 2019-09-15 DIAGNOSIS — F331 Major depressive disorder, recurrent, moderate: Secondary | ICD-10-CM | POA: Diagnosis present

## 2019-09-15 DIAGNOSIS — Z79899 Other long term (current) drug therapy: Secondary | ICD-10-CM | POA: Insufficient documentation

## 2019-09-15 DIAGNOSIS — G2581 Restless legs syndrome: Secondary | ICD-10-CM | POA: Diagnosis present

## 2019-09-15 DIAGNOSIS — R45851 Suicidal ideations: Secondary | ICD-10-CM | POA: Diagnosis not present

## 2019-09-15 DIAGNOSIS — Z87891 Personal history of nicotine dependence: Secondary | ICD-10-CM | POA: Insufficient documentation

## 2019-09-15 DIAGNOSIS — F1014 Alcohol abuse with alcohol-induced mood disorder: Secondary | ICD-10-CM | POA: Diagnosis present

## 2019-09-15 DIAGNOSIS — Z8659 Personal history of other mental and behavioral disorders: Secondary | ICD-10-CM

## 2019-09-15 DIAGNOSIS — F411 Generalized anxiety disorder: Secondary | ICD-10-CM

## 2019-09-15 LAB — CBC
HCT: 42.2 % (ref 36.0–46.0)
Hemoglobin: 13.6 g/dL (ref 12.0–15.0)
MCH: 31.6 pg (ref 26.0–34.0)
MCHC: 32.2 g/dL (ref 30.0–36.0)
MCV: 97.9 fL (ref 80.0–100.0)
Platelets: 210 10*3/uL (ref 150–400)
RBC: 4.31 MIL/uL (ref 3.87–5.11)
RDW: 12.7 % (ref 11.5–15.5)
WBC: 3.4 10*3/uL — ABNORMAL LOW (ref 4.0–10.5)
nRBC: 0 % (ref 0.0–0.2)

## 2019-09-15 LAB — COMPREHENSIVE METABOLIC PANEL
ALT: 27 U/L (ref 0–44)
AST: 36 U/L (ref 15–41)
Albumin: 4.5 g/dL (ref 3.5–5.0)
Alkaline Phosphatase: 86 U/L (ref 38–126)
Anion gap: 6 (ref 5–15)
BUN: 10 mg/dL (ref 6–20)
CO2: 30 mmol/L (ref 22–32)
Calcium: 8.8 mg/dL — ABNORMAL LOW (ref 8.9–10.3)
Chloride: 105 mmol/L (ref 98–111)
Creatinine, Ser: 0.81 mg/dL (ref 0.44–1.00)
GFR calc Af Amer: >60 mL/min (ref >60)
GFR calc non Af Amer: >60 mL/min (ref >60)
Glucose, Bld: 70 mg/dL (ref 70–99)
Potassium: 4.3 mmol/L (ref 3.5–5.1)
Sodium: 141 mmol/L (ref 135–145)
Total Bilirubin: 0.5 mg/dL (ref 0.3–1.2)
Total Protein: 8.2 g/dL — ABNORMAL HIGH (ref 6.5–8.1)

## 2019-09-15 LAB — ETHANOL: Alcohol, Ethyl (B): 235 mg/dL — ABNORMAL HIGH (ref <10)

## 2019-09-15 LAB — SALICYLATE LEVEL: Salicylate Lvl: <7 mg/dL — ABNORMAL LOW (ref 7.0–30.0)

## 2019-09-15 LAB — ACETAMINOPHEN LEVEL: Acetaminophen (Tylenol), Serum: <10 ug/mL — ABNORMAL LOW (ref 10–30)

## 2019-09-15 NOTE — Discharge Instructions (Addendum)
She was cleared by the psychiatric team for discharge home

## 2019-09-15 NOTE — ED Triage Notes (Signed)
Patient states that she is having suicidal thoughts. Per sheriff patient called 911 stating that she wanted to hurt herself and that she was going to cut herself. Patient states that she has had previous suicide attempts in the past.

## 2019-09-15 NOTE — ED Notes (Addendum)
Patient changed into hospital scrubs by this RN and Kadijah EDT. Patient belongings placed in a belonging bag. Patient has shirt, shorts, underwear, shoes, hair tie and yellow color bracelet. Patient's belongings given to her husband's request.

## 2019-09-15 NOTE — Consult Note (Signed)
[facility] Face-to-Face Psychiatry Consult   Reason for Consult: Suicidal ideation Referring Physician:  Dr. Fuller Plan [name] MRN:  [id number] Principal Diagnosis: MDD (major depressive disorder), recurrent episode, moderate (HCC) Diagnosis:   Patient Active Problem List   Diagnosis Date Noted  . GERD (gastroesophageal reflux disease) [K21.9] 04/16/2019  . Restless leg syndrome [G25.81] 04/16/2019  . Alcohol abuse with alcohol-induced mood disorder (HCC) [F10.14] 10/14/2018  . Borderline personality disorder (HCC) [F60.3] 12/21/2015  . 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 MDD (major depressive disorder), recurrent episode, moderate (HCC) [F33.1] 12/21/2015  . Seizure (HCC) [R56.9] 12/18/2015  . Opiate overdose (HCC) [T40.601A] 09/28/2015  . 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 Opioid use disorder, moderate, dependence (HCC) [F11.20] 09/28/2015  . Anxiety disorder [F41.9] 05/01/2015  . 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 H/O eating disorder [Z86.59] 09/19/2013  . Depression [F32.9] 09/19/2013    Total Time spent with patient: 1 hour  Subjective: "I called crisis hotline, I did not think that they would bring me here."  Identifying data.[name] is a 59 y.o. female with a history of depression, mood instability, anxiety, alcohol abuse substance abuse and multiple suicide attempts.  Patient called the police stating that she was wanting to hurt herself and she was going to cut herself.  Patient reports that she cut herself in the past.  Denies cutting today. Denies having a plan to me but earlier she reported plan to cut herself.  She does report using alcohol but denies any other drugs.  Patient states that she is willing to stay here and be evaluated by the psychiatric team.  Her SI is moderate, constant, nothing makes better, nothing makes it worse.  She denies any other medical  concerns.  On evaluation today, patient is denying ongoing suicidal ideation.  She reports that she became overwhelmingly depressed last night, and then wanted to talk to her husband but he had already gone to sleep.  She instead decided to call the crisis line who recommended that she call 911.  Patient reports she has a history of at least 10 past suicide attempts.  She states her last hospital admission was in June 2021 when she was admitted for worsening depression in the context of substance abuse.  Patient admits that she has a history of alcohol abuse and has had multiple treatments in the past for alcohol abuse.  She states that she now will drink 1-2 times a month.  She does admit that when she drinks she drinks to get drunk.  Patient was drinking last night.  Patient reports that she is happy to be alive.  She reports that her husband got her a young puppy which is about a year old, hound mix that she feels responsible for and enjoys spending time with.  She feels that she can be safe.  She states that she has been receiving her psychiatric care from Dr. Nipomo Sink at [facility].  She has been attending therapy once a month also at [facility].  She is aware of her psychotropic medication to include Lamictal, venlafaxine, trazodone, and gabapentin (for restless leg syndrome).  Patient denies any homicidal ideation.  She denies any history or current auditory or visual hallucinations.  She does not appear to be responding to internal stimuli.  She denies access to weapons.   Collateral from husband, Chestine Spore is obtained: Husband reports that patient has had 3-4 suicide attempts in the past 6 years.  He describes these more as gestures and actual suicide attempts.  She will cut her wrists, but never severe enough to require stitches or other medical attention.  The last time she cut her wrist was approximately 6 years ago.  She will take more pills than she should, and then tell him about it.  He believes her last overdose  attempt was 4-5 years ago. Husband reports that patient's depression does seem worse when she drinks, however he notes that she can also suddenly go into depression when she has not been drinking.  He reports they have been married for 31 years, and this has been a pattern for a long time.  He describes that she had a bad childhood.  Husband reports that patient does not go out of the house without her, due to her social anxiety.  He did have a scare where she had his gun in June 2021 and he was concerned she might accidentally harm herself.  He has since secured the gun so that she is unable to get it.  He believes that he is able to maintain her safety and does not think a another inpatient psychiatric hospitalization would be beneficial at this time.  He is pleased that she reached out for help through the crisis line instead of harming herself. To the right of the  Past psychiatric history (per record review). She is has been struggling with depression since her teenage years. She had several psychiatric hospital overdose and cutting when younger. One of the overdose was severe enough to require respirator. She has narrowing of her trachea from it. She has been tried on multiple antidepressants to include: Lamictal, Saphris, Doxepin, Effexor, Prozac, Seroquel. She has never been on antipsychotic.   Family psychiatric history. She denies any.  Social history. She lives with her husband of 31 years.  She has 2 grown children-a son and daughter.  She is on disability for chronic depression and severe anxiety.  Risk to Self:  Able to contract for safety Risk to Others:  No Prior Inpatient Therapy:  Yes, multiple Prior Outpatient Therapy:  Yes-current with [facility]  Past Medical History:  Past Medical History:  Diagnosis Date  . Alcohol abuse unk  . Anxiety unk  . Aspiration into airway   . Depression unk    Past Surgical History:  Procedure Laterality Date  . ABDOMINAL HYSTERECTOMY    . FOOT  SURGERY    . TRACHEAL SURGERY     Family History:  Family History  Problem Relation Age of Onset  . Breast cancer Mother   . Hypertension Mother   . Depression Mother   . Anxiety disorder Mother   . Alcohol abuse Father   . Alcohol abuse Sister   . Anxiety disorder Sister   . Bipolar disorder Sister    Social History:  Social History   Substance and Sexual Activity  Alcohol Use Yes   Comment: occ     Social History   Substance and Sexual Activity  Drug Use No    Social History   Socioeconomic History  . Marital status: Married    Spouse name: Not on file  . Number of children: Not on file  . Years of education: Not on file  . Highest education level: Not on file  Occupational History  . Not on file  Tobacco Use  . Smoking status: Former Smoker    Types: Cigarettes  . Smokeless tobacco: Never Used  Substance and Sexual Activity  .  Alcohol use: Yes    Comment: occ  . Drug use: No  . Sexual activity: Not Currently  Other Topics Concern  . Not on file  Social History Narrative  . Not on file   Social Determinants of Health   Financial Resource Strain:   . Difficulty of Paying Living Expenses:   Food Insecurity:   . Worried About Programme researcher, broadcasting/film/video in the Last Year:   . Barista in the Last Year:   Transportation Needs:   . Freight forwarder (Medical):   Marland Kitchen Lack of Transportation (Non-Medical):   Physical Activity:   . Days of Exercise per Week:   . Minutes of Exercise per Session:   Stress:   . Feeling of Stress :   Social Connections:   . Frequency of Communication with Friends and Family:   . Frequency of Social Gatherings with Friends and Family:   . Attends Religious Services:   . Active Member of Clubs or Organizations:   . Attends Banker Meetings:   Marland Kitchen Marital Status:    Additional Social History:  Lives with husband On disability  Allergies:   Allergies  Allergen Reactions  . Penicillins Hives    Has patient  had a PCN reaction causing immediate rash, facial/tongue/throat swelling, SOB or lightheadedness with hypotension: Yes Has patient had a PCN reaction causing severe rash involving mucus membranes or skin necrosis: No Has patient had a PCN reaction that required hospitalization Yes Has patient had a PCN reaction occurring within the last 10 years: Yes If all of the above answers are "NO", then may proceed with Cephalosporin use.   . Latex Dermatitis    Labs:  Results for orders placed or performed during the hospital encounter of 09/15/19 (from the past 48 hour(s))  Comprehensive metabolic panel     Status: Abnormal   Collection Time: 09/15/19  6:39 AM  Result Value Ref Range   Sodium 141 135 - 145 mmol/L   Potassium 4.3 3.5 - 5.1 mmol/L   Chloride 105 98 - 111 mmol/L   CO2 30 22 - 32 mmol/L   Glucose, Bld 70 70 - 99 mg/dL    Comment: Glucose reference range applies only to samples taken after fasting for at least 8 hours.   BUN 10 6 - 20 mg/dL   Creatinine, Ser 1.30 0.44 - 1.00 mg/dL   Calcium 8.8 (L) 8.9 - 10.3 mg/dL   Total Protein 8.2 (H) 6.5 - 8.1 g/dL   Albumin 4.5 3.5 - 5.0 g/dL   AST 36 15 - 41 U/L   ALT 27 0 - 44 U/L   Alkaline Phosphatase 86 38 - 126 U/L   Total Bilirubin 0.5 0.3 - 1.2 mg/dL   GFR calc non Af Amer >60 >60 mL/min   GFR calc Af Amer >60 >60 mL/min   Anion gap 6 5 - 15    Comment: Performed at [facility], [address], [place], [place] [address]  Ethanol     Status: Abnormal   Collection Time: 09/15/19  6:39 AM  Result Value Ref Range   Alcohol, Ethyl (B) 235 (H) <10 mg/dL    Comment: (NOTE) Lowest detectable limit for serum alcohol is 10 mg/dL.  For medical purposes only. Performed at [facility], [address], [place], [place] [address]   Salicylate level     Status: Abnormal   Collection Time: 09/15/19  6:39 AM  Result Value Ref Range  Salicylate Lvl <7.0 (L) 7.0 - 30.0 mg/dL    Comment: Performed at [facility], [address] [place], [place] [address]  Acetaminophen level     Status: Abnormal   Collection Time: 09/15/19  6:39 AM  Result Value Ref Range   Acetaminophen (Tylenol), Serum <10 (L) 10 - 30 ug/mL    Comment: (NOTE) Therapeutic concentrations vary significantly. A range of 10-30 ug/mL  may be an effective concentration for many patients. However, some  are best treated at concentrations outside of this range. Acetaminophen concentrations >150 ug/mL at 4 hours after ingestion  and >50 ug/mL at 12 hours after ingestion are often associated with  toxic reactions.  Performed at [facility], [address] [place], [place] [address]   cbc     Status: Abnormal   Collection Time: 09/15/19  6:39 AM  Result Value Ref Range   WBC 3.4 (L) 4.0 - 10.5 K/uL   RBC 4.31 3.87 - 5.11 MIL/uL   Hemoglobin 13.6 12.0 - 15.0 g/dL   HCT 09.842.2 36 - 46 %   MCV 97.9 80.0 - 100.0 fL   MCH 31.6 26.0 - 34.0 pg   MCHC 32.2 30.0 - 36.0 g/dL   RDW 11.912.7 14.711.5 - 82.915.5 %   Platelets 210 150 - 400 K/uL   nRBC 0.0 0.0 - 0.2 %    Comment: Performed at [facility], [address] [place], [place] [address]    No current facility-administered medications for this encounter.   Current Outpatient Medications  Medication Sig Dispense Refill  . atorvastatin (LIPITOR) 20 MG tablet Take 20 mg by mouth daily.    . famotidine (PEPCID) 20 MG tablet Take 20 mg by mouth at bedtime.    . gabapentin (NEURONTIN) 600 MG tablet Take 600 mg by mouth at bedtime.    . lamoTRIgine (LAMICTAL) 200 MG tablet Take 1 tablet (200 mg total) by mouth daily. 30 tablet 1  . omeprazole (PRILOSEC) 40 MG capsule Take 40 mg by mouth daily.    . traZODone (DESYREL) 100 MG tablet Take 300 mg by mouth at bedtime.    Marland Kitchen. venlafaxine XR (EFFEXOR-XR) 75 MG 24 hr capsule Take 75 mg by mouth daily with breakfast.      Musculoskeletal: Strength & Muscle Tone: within normal limits Gait & Station: normal Patient  leans: N/A  Psychiatric Specialty Exam: Physical Exam Vitals and nursing note reviewed. Exam conducted with a chaperone present.  Constitutional:      General: She is not in acute distress.    Appearance: Normal appearance.  HENT:     Head: Normocephalic and atraumatic.     Comments: Hard of hearing    Nose: Nose normal.  Eyes:     Extraocular Movements: Extraocular movements intact.  Pulmonary:     Effort: Pulmonary effort is normal. No respiratory distress.  Musculoskeletal:        General: Normal range of motion.     Cervical back: Normal range of motion.  Skin:    General: Skin is warm and dry.  Neurological:     General: No focal deficit present.     Mental Status: She is oriented to person, place, and time.     Review of Systems  Respiratory: Negative.   Cardiovascular: Negative.   Musculoskeletal: Negative.   Neurological: Negative.   Psychiatric/Behavioral: Positive for substance abuse (alcohol). Negative for depression, hallucinations and suicidal ideas. The patient is nervous/anxious.   All other systems reviewed and  are negative.   Blood pressure 125/89, pulse 85, temperature 98.5 F (36.9 C), temperature source Oral, resp. rate 16, height  (1.651 m), weight 59 kg, SpO2 97 %.Body mass index is 21.63 kg/m.  General Appearance: Casual  Eye Contact:  Good  Speech:  Clear and Coherent  Volume:  Normal  Mood:  Anxious  Affect:  Blunt  Thought Process:  Goal Directed  Orientation:  Full (Time, Place, and Person)  Thought Content:  Logical and Hallucinations: None  Suicidal Thoughts:  No  Homicidal Thoughts:  No  Memory:  Immediate;   Fair Recent;   Fair Remote;   Fair  Judgement:  Fair  Insight:  Lacking  Psychomotor Activity:  Normal  Concentration:  Concentration: Fair and Attention Span: Fair  Recall:  [employer] of Knowledge:  Fair  Language:  Fair  Akathisia:  No  Handed:  Right  AIMS (if indicated):     Assets:  Communication Skills Desire  for Improvement Housing Physical Health Resilience Social Support  ADL's:  Intact  Cognition:  WNL  Sleep:   reports good    Treatment Plan Summary: Plan Encourage patient to use [phone] text crisis line.  Asked husband to remove alcohol from the house. Encouraged patient to increase her therapy sessions to once a week and psychiatry sessions once a month.  Disposition: Recommend psychiatric Inpatient admission when medically cleared. Supportive therapy provided about ongoing stressors. Discussed crisis plan, support from social network, calling 911, coming to the Emergency Department, and calling Suicide Hotline.  Patient has follow-up with her therapist in 1 week. Patient has follow-up with her psychiatrist in 2 weeks.   Mariel Craft, MD 09/15/2019 11:04 AM

## 2019-09-15 NOTE — BH Assessment (Addendum)
Assessment Note  Patricia [name] is an 59 y.o. female who presents to [facility] ED voluntarily for treatment. Per triage, Patient states that she is having suicidal thoughts. Per sheriff patient called 911 stating that she wanted to hurt herself and that she was going to cut herself. Patient states that she has had previous suicide attempts in the past.   During TTS assessment pt presents alert and oriented x 3, anxious but cooperative, and mood-congruent with affect. The pt does not appear to be responding to internal and external stimuli. Neither is the pt presenting with any delusional thinking. Pt confirmed the information provided to the triage RN. Pt reports feeling depressed last night while her husband was sleeping and contacted the crisis line to talk to somebody. Pt denied any triggers to her current symptoms and stated "It just feel depressed at random times".  Pt states "I only needed to talk to someone I didn't know I would end up here". Pt reports a SI hx with attempts. Pt identified her last attempt using a shotgun to be 6 months ago after the loss of her dog. Pt identified hx of cutting behaviors and overdosing on pills. Pt reports no concerns with her eating or sleeping habits. Pt reports a MH hx of (depression & anxiety) and SA hx of (Alcohol). Pt identified an INPT Hx with [facility] and a current OPT hx with [facility]. Pt reports to have an appointment scheduled with her psychiatrist in two weeks and an appointment with her therapist next week. Pt denies a family hx of MH/SA and confirmed medication compliance. Pt denies any current SI/HI/AH/VH and contracted for safety.   Collateral gathered by husband Makynzie Dobesh): [name] confirmed the information provided by pt. [name] reports pt to have experience a "bad childhood". [name] reports during the 31 years of their marriage, pt attempted suicide at least 10x's. [name] reports pt to experience depression at any point throughout her day. [name] denied any warning signs,  triggers or specific times frames to pt increased symptoms and stated "My wife doesn't really want to kill herself, if she wanted to she would have done it". Karl identified pt's hx of attempts to be by pills, cutting wrist, and most recent using a shot gun. [name] denied pt to have access to medications unsupervised or weapons (Locked away). [name] identified no safety or other concerns and confirmed the plan for pt to continue with the resources in place.   Per Dr. Viviano Simas, pt will be discharged with the recommendation to follow up with current resources/appointments   Diagnosis: MDD  Past Medical History:  Past Medical History:  Diagnosis Date  . Alcohol abuse unk  . Anxiety unk  . Aspiration into airway   . Depression unk    Past Surgical History:  Procedure Laterality Date  . ABDOMINAL HYSTERECTOMY    . FOOT SURGERY    . TRACHEAL SURGERY      Family History:  Family History  Problem Relation Age of Onset  . Breast cancer Mother   . Hypertension Mother   . Depression Mother   . Anxiety disorder Mother   . Alcohol abuse Father   . Alcohol abuse Sister   . Anxiety disorder Sister   . Bipolar disorder Sister     Social History:  reports that she has quit smoking. Her smoking use included cigarettes. She has never used smokeless tobacco. She reports current alcohol use. She reports that she does not use drugs.  Additional Social History:  Alcohol /  Drug Use Pain Medications: see mar Prescriptions: see mar Over the Counter: see mar History of alcohol / drug use?: Yes Substance #1 Name of Substance 1: Alcohol  CIWA: CIWA-Ar BP: 125/89 Pulse Rate: 85 COWS:    Allergies:  Allergies  Allergen Reactions  . Penicillins Hives    Has patient had a PCN reaction causing immediate rash, facial/tongue/throat swelling, SOB or lightheadedness with hypotension: Yes Has patient had a PCN reaction causing severe rash involving mucus membranes or skin necrosis: No Has patient had a  PCN reaction that required hospitalization Yes Has patient had a PCN reaction occurring within the last 10 years: Yes If all of the above answers are "NO", then may proceed with Cephalosporin use.   . Latex Dermatitis    Home Medications: (Not in a hospital admission)   OB/GYN Status:  No LMP recorded. Patient has had a hysterectomy.  General Assessment Data Location of Assessment: [facility] ED TTS Assessment: In system Is this a Tele or Face-to-Face Assessment?: Face-to-Face Is this an Initial Assessment or a Re-assessment for this encounter?: Initial Assessment Patient Accompanied by:: Adult (spouse) Permission Given to speak with another: Yes Name, Relationship and Phone Number: Sharri Loya, Husband [phone] Language Other than English: No Living Arrangements: Other (Comment) (Private home ) What gender do you identify as?: Female Date Telepsych consult ordered in [facility]: 09/15/19 Marital status: Married Tennyson name: unknown Pregnancy Status: No Living Arrangements: Spouse/significant other Can pt return to current living arrangement?: Yes Admission Status: Voluntary Is patient capable of signing voluntary admission?: Yes Referral Source: Self/Family/Friend Insurance type: Merchandiser, retail Exam [facility] Walk-in ONLY) Medical Exam completed: Yes  Crisis Care Plan Living Arrangements: Spouse/significant other Legal Guardian:  (self) Name of Psychiatrist: Dr. Flavia Shipper ([facility]) Name of Therapist: Nida Boatman [facility] )  Education Status Is patient currently in school?: No Is the patient employed, unemployed or receiving disability?: Receiving disability income  Risk to self with the past 6 months Suicidal Ideation: No-Not Currently/Within Last 6 Months Has patient been a risk to self within the past 6 months prior to admission? : Yes Suicidal Intent: No-Not Currently/Within Last 6 Months Has patient had any suicidal intent within the past 6 months prior  to admission? : Yes Is patient at risk for suicide?: No Suicidal Plan?: No-Not Currently/Within Last 6 Months Has patient had any suicidal plan within the past 6 months prior to admission? : Yes Access to Means: No What has been your use of drugs/alcohol within the last 12 months?: Alcohol Previous Attempts/Gestures: Yes How many times?:  (husband reports many ) Other Self Harm Risks: cutting  Triggers for Past Attempts: Unknown Intentional Self Injurious Behavior: Cutting Comment - Self Injurious Behavior: slitting wrist Family Suicide History: No Recent stressful life event(s): Loss (Comment) (dog) Persecutory voices/beliefs?: No Depression: Yes Depression Symptoms: Despondent Substance abuse history and/or treatment for substance abuse?: Yes Suicide prevention information given to non-admitted patients: Yes  Risk to Others within the past 6 months Homicidal Ideation: No Does patient have any lifetime risk of violence toward others beyond the six months prior to admission? : No Thoughts of Harm to Others: No Current Homicidal Intent: No Current Homicidal Plan: No Access to Homicidal Means: No Identified Victim: n/a History of harm to others?: No Assessment of Violence: None Noted Violent Behavior Description: n/a Does patient have access to weapons?: No Criminal Charges Pending?: No Does patient have a court date: No Is patient on probation?: No  Psychosis Hallucinations: None noted  Delusions: None noted  Mental Status Report Appearance/Hygiene: Unremarkable Eye Contact: Good Motor Activity: Freedom of movement Speech: Logical/coherent Level of Consciousness: Alert Mood: Anxious, Pleasant Affect: Appropriate to circumstance Anxiety Level: Minimal Thought Processes: Coherent, Relevant Judgement: Partial Orientation: Appropriate for developmental age Obsessive Compulsive Thoughts/Behaviors: None  Cognitive Functioning Concentration: Good Memory: Recent Intact,  Remote Intact Is patient IDD: No Insight: Fair Impulse Control: Good Appetite: Good Have you had any weight changes? : No Change Sleep: No Change Total Hours of Sleep: 8 Vegetative Symptoms: None     Prior Inpatient Therapy Prior Inpatient Therapy: Yes Prior Therapy Dates: 08/08/19 Prior Therapy Facilty/Provider(s): [facility] Reason for Treatment: Depression, SI & SA  Prior Outpatient Therapy Prior Outpatient Therapy: Yes Prior Therapy Dates: Current  Prior Therapy Facilty/Provider(s): [facility] Reason for Treatment: Depression  Does patient have an ACCT team?: No Does patient have Intensive In-House Services?  : No Does patient have [facility] services? : Unknown Does patient have P4CC services?: Unknown  ADL Screening (condition at time of admission) Is the patient deaf or have difficulty hearing?: No Does the patient have difficulty seeing, even when wearing glasses/contacts?: No Does the patient have difficulty concentrating, remembering, or making decisions?: No Does the patient have difficulty dressing or bathing?: No Does the patient have difficulty walking or climbing stairs?: No Weakness of Legs: None Weakness of Arms/Hands: None  Home Assistive Devices/Equipment Home Assistive Devices/Equipment: None  Therapy Consults (therapy consults require a physician order) PT Evaluation Needed: No OT Evalulation Needed: No SLP Evaluation Needed: No Abuse/Neglect Assessment (Assessment to be complete while patient is alone) Abuse/Neglect Assessment Can Be Completed: Yes Physical Abuse: Denies Verbal Abuse: Denies Sexual Abuse: Denies Exploitation of patient/patient's resources: Denies Self-Neglect: Denies Values / Beliefs Cultural Requests During Hospitalization: None Spiritual Requests During Hospitalization: None Consults Spiritual Care Consult Needed: No Transition of Care Team Consult Needed: No Advance Directives (For Healthcare) Does Patient Have a Medical Advance  Directive?: No          Disposition:  Disposition Initial Assessment Completed for this Encounter: Yes Patient referred to: Other (Comment) (current resources )  On Site Evaluation by:   Reviewed with Physician:    Opal Sidles 09/15/2019 11:54 AM

## 2019-09-15 NOTE — ED Provider Notes (Addendum)
[facility] Emergency Department Provider Note  ____________________________________________   First MD Initiated Contact with Patient 09/15/19 [phone]     (approximate)  I have reviewed the triage vital signs and the nursing notes.   HISTORY  Chief Complaint Psychiatric Evaluation    HPI Patricia [name] is a 59 y.o. female with depression and anxiety and alcohol abuse who comes in with SI.  Patient called the police stating that she was wanting to hurt herself and she was going to cut herself.  Patient reports that she cut herself in the past.  Denies cutting today. Denies having a plan to me but earlier she reported plan to cut herself.  She does report using alcohol but denies any other drugs.  Patient states that she is willing to stay here and be evaluated by the psychiatric team.  Her SI is moderate, constant, nothing makes better, nothing makes it worse.  She denies any other medical concerns.          Past Medical History:  Diagnosis Date  . Alcohol abuse unk  . Anxiety unk  . Aspiration into airway   . Depression unk    Patient Active Problem List   Diagnosis Date Noted  . Alcohol abuse with alcohol-induced mood disorder (HCC) 10/14/2018  . Borderline personality disorder (HCC) 12/21/2015  . 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 MDD (major depressive disorder), recurrent episode, moderate (HCC) 12/21/2015  . Seizure (HCC) 12/18/2015  . Opiate overdose (HCC) 09/28/2015  . Stimulant use disorder 09/28/2015  . Opioid use disorder, moderate, dependence (HCC) 09/28/2015  . 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 H/O eating disorder 09/19/2013    Past Surgical History:  Procedure Laterality Date  . ABDOMINAL HYSTERECTOMY    . FOOT SURGERY    . TRACHEAL SURGERY      Prior to Admission medications   Medication Sig Start Date End Date Taking? Authorizing Provider  atorvastatin (LIPITOR) 40 MG  tablet Take 40 mg by mouth at bedtime.     [provider]  gabapentin (NEURONTIN) 600 MG tablet Take 600 mg by mouth at bedtime.    [provider]  lamoTRIgine (LAMICTAL) 200 MG tablet Take 1 tablet (200 mg total) by mouth daily. 10/29/15   Pucilowska, Jolanta B, MD  omeprazole (PRILOSEC) 40 MG capsule Take 40 mg by mouth daily.    [provider]  traZODone (DESYREL) 100 MG tablet Take 300 mg by mouth at bedtime.    [provider]  venlafaxine XR (EFFEXOR-XR) 75 MG 24 hr capsule Take 75 mg by mouth daily with breakfast.    [provider]    Allergies Penicillins and Latex  Family History  Problem Relation Age of Onset  . Breast cancer Mother   . Hypertension Mother   . Depression Mother   . Anxiety disorder Mother   . Alcohol abuse Father   . Alcohol abuse Sister   . Anxiety disorder Sister   . Bipolar disorder Sister     Social History Social History   Tobacco Use  . Smoking status: Former Smoker    Types: Cigarettes  . Smokeless tobacco: Never Used  Substance Use Topics  . Alcohol use: Yes    Comment: occ  . Drug use: No      Review of Systems Constitutional: No fever/chills Eyes: No visual changes. ENT: No sore throat. Cardiovascular: Denies chest pain. Respiratory: Denies shortness of breath. Gastrointestinal: No abdominal pain.  No  nausea, no vomiting.  No diarrhea.  No constipation. Genitourinary: Negative for dysuria. Musculoskeletal: Negative for back pain. Skin: Negative for rash. Neurological: Negative for headaches, focal weakness or numbness. Psych: SI All other ROS negative ____________________________________________   PHYSICAL EXAM:  VITAL SIGNS: ED Triage Vitals [09/15/19 0635]  Enc Vitals Group     BP 113/70     Pulse Rate 90     Resp 18     Temp 98.1 F (36.7 C)     Temp Source Oral     SpO2 96 %     Weight 130 lb (59 kg)     Height 5\' 5"  (1.651 m)     Head Circumference      Peak  Flow      Pain Score 0     Pain Loc      Pain Edu?      Excl. in GC?     Constitutional: Alert and oriented. Well appearing and in no acute distress. Eyes: Conjunctivae are normal. EOMI. Head: Atraumatic. Nose: No congestion/rhinnorhea. Mouth/Throat: Mucous membranes are moist.   Neck: No stridor. Trachea Midline. FROM Cardiovascular: Normal rate, regular rhythm. Grossly normal heart sounds.  Good peripheral circulation. Respiratory: Normal respiratory effort.  No retractions. Lungs CTAB. Gastrointestinal: Soft and nontender. No distention. No abdominal bruits.  Musculoskeletal: No lower extremity tenderness nor edema.  No joint effusions. Neurologic:  Normal speech and language. No gross focal neurologic deficits are appreciated.  Skin:  Skin is warm, dry and intact. No rash noted. Psychiatric: Mood and affect are normal. Speech and behavior are normal.  Positive SI GU: Deferred   ____________________________________________   LABS (all labs ordered are listed, but only abnormal results are displayed)  Labs Reviewed  COMPREHENSIVE METABOLIC PANEL - Abnormal; Notable for the following components:      Result Value   Calcium 8.8 (*)    Total Protein 8.2 (*)    All other components within normal limits  ETHANOL - Abnormal; Notable for the following components:   Alcohol, Ethyl (B) 235 (*)    All other components within normal limits  SALICYLATE LEVEL - Abnormal; Notable for the following components:   Salicylate Lvl <7.0 (*)    All other components within normal limits  ACETAMINOPHEN LEVEL - Abnormal; Notable for the following components:   Acetaminophen (Tylenol), Serum <10 (*)    All other components within normal limits  CBC - Abnormal; Notable for the following components:   WBC 3.4 (*)    All other components within normal limits  URINE DRUG SCREEN, QUALITATIVE ([facility] ONLY)   ____________________________________________  INITIAL IMPRESSION / ASSESSMENT AND PLAN / ED  COURSE  Patricia [name] was evaluated in Emergency Department on 09/15/2019 for the symptoms described in the history of present illness. She was evaluated in the context of the global COVID-19 pandemic, which necessitated consideration that the patient might be at risk for infection with the SARS-CoV-2 virus that causes COVID-19. Institutional protocols and algorithms that pertain to the evaluation of patients at risk for COVID-19 are in a state of rapid change based on information released by regulatory bodies including the CDC and federal and state organizations. These policies and algorithms were followed during the patient's care in the ED.    Pt is without any acute medical complaints. No exam findings to suggest medical cause of current presentation. Will order psychiatric screening labs and discuss further w/ psychiatric service.  D/d includes but is not limited to psychiatric disease, behavioral/personality  disorder, inadequate socioeconomic support, medical.  Based on HPI, exam, unremarkable labs, no concern for acute medical problem at this time. No rigidity, clonus, hyperthermia, focal neurologic deficit, diaphoresis, tachycardia, meningismus, ataxia, gait abnormality or other finding to suggest this visit represents a non-psychiatric problem. Screening labs reviewed.    Given this, pt medically cleared, to be dispositioned per Psych.    EtOH elevated to 235  The patient has been placed in psychiatric observation due to the need to provide a safe environment for the patient while obtaining psychiatric consultation and evaluation, as well as ongoing medical and medication management to treat the patient's condition.  The patient has not been placed under full IVC at this time.  Pt seen by psychiatry. Contracted for safety and husband would like to take home and has f.u next week. See their note for more details.        ____________________________________________   FINAL CLINICAL  IMPRESSION(S) / ED DIAGNOSES   Final diagnoses:  Suicidal ideation      MEDICATIONS GIVEN DURING THIS VISIT:  Medications - No data to display   ED Discharge Orders    None       Note:  This document was prepared using Dragon voice recognition software and may include unintentional dictation errors.   Concha Se, MD 09/15/19 5631    Concha Se, MD 09/15/19 1046

## 2020-02-07 ENCOUNTER — Emergency Department
Admission: EM | Admit: 2020-02-07 | Discharge: 2020-02-07 | Discharge status: Against Medical Advice | Payer: Medicare HMO

## 2020-05-22 ENCOUNTER — Emergency Department
Admission: EM | Admit: 2020-05-22 | Discharge: 2020-05-22 | Payer: Medicare HMO | Attending: Emergency Medicine | Admitting: Emergency Medicine

## 2020-05-22 DIAGNOSIS — R4689 Other symptoms and signs involving appearance and behavior: Secondary | ICD-10-CM | POA: Insufficient documentation

## 2020-05-22 DIAGNOSIS — Z87891 Personal history of nicotine dependence: Secondary | ICD-10-CM | POA: Insufficient documentation

## 2020-05-22 DIAGNOSIS — Z91040 Latex allergy status: Secondary | ICD-10-CM | POA: Insufficient documentation

## 2020-05-22 DIAGNOSIS — Z79899 Other long term (current) drug therapy: Secondary | ICD-10-CM | POA: Diagnosis not present

## 2020-05-22 NOTE — ED Provider Notes (Signed)
[facility] Emergency Department Provider Note  ____________________________________________   I have reviewed the triage vital signs and the nursing notes.   HISTORY  Chief Complaint Medical Clearance   History limited by and level 5 caveat due to: Non cooperativeness.   HPI Patricia [name] is a 60 y.o. female who presents to the emergency department today because of concerns for abnormal behavior.  The patient was under arrest.  When she got to the jail she started banging her head against the bars.  Officer states she then went down to the ground.  She continued to yell and scream and officer stated she might of passed out again.  Patient refuses to answer any my questions.   Records reviewed. Per medical record review patient has a history of anxiety, psychiatric illness, drug use.   Past Medical History:  Diagnosis Date  . Alcohol abuse unk  . Anxiety unk  . Aspiration into airway   . Depression unk    Patient Active Problem List   Diagnosis Date Noted  . GERD (gastroesophageal reflux disease) 04/16/2019  . Restless leg syndrome 04/16/2019  . Alcohol abuse with alcohol-induced mood disorder (HCC) 10/14/2018  . Borderline personality disorder (HCC) 12/21/2015  . 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 MDD (major depressive disorder), recurrent episode, moderate (HCC) 12/21/2015  . Seizure (HCC) 12/18/2015  . Opiate overdose (HCC) 09/28/2015  . Stimulant use disorder 09/28/2015  . Opioid use disorder, moderate, dependence (HCC) 09/28/2015  . Anxiety disorder 05/01/2015  . 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 H/O eating disorder 09/19/2013  . Depression 09/19/2013    Past Surgical History:  Procedure Laterality Date  . ABDOMINAL HYSTERECTOMY    . FOOT SURGERY    . TRACHEAL SURGERY      Prior to Admission medications   Medication Sig Start Date End Date Taking? Authorizing Provider   atorvastatin (LIPITOR) 20 MG tablet Take 20 mg by mouth daily. 08/26/19   [provider]  famotidine (PEPCID) 20 MG tablet Take 20 mg by mouth at bedtime. 07/13/19   [provider]  gabapentin (NEURONTIN) 600 MG tablet Take 600 mg by mouth at bedtime.    [provider]  lamoTRIgine (LAMICTAL) 200 MG tablet Take 1 tablet (200 mg total) by mouth daily. 10/29/15   Pucilowska, Jolanta B, MD  omeprazole (PRILOSEC) 40 MG capsule Take 40 mg by mouth daily.    [provider]  traZODone (DESYREL) 100 MG tablet Take 300 mg by mouth at bedtime.    [provider]  venlafaxine XR (EFFEXOR-XR) 75 MG 24 hr capsule Take 75 mg by mouth daily with breakfast.    [provider]    Allergies Penicillins and Latex  Family History  Problem Relation Age of Onset  . Breast cancer Mother   . Hypertension Mother   . Depression Mother   . Anxiety disorder Mother   . Alcohol abuse Father   . Alcohol abuse Sister   . Anxiety disorder Sister   . Bipolar disorder Sister     Social History Social History   Tobacco Use  . Smoking status: Former Smoker    Types: Cigarettes  . Smokeless tobacco: Never Used  Substance Use Topics  . Alcohol use: Yes    Comment: occ  . Drug use: No    Review of Systems Unable to obtain secondary to patient non cooperativeness.  ____________________________________________   PHYSICAL EXAM: PE limited secondary to patient refusing.  VITAL SIGNS: ED Triage Vitals  Enc Vitals Group     BP 05/22/20 2039 (!) 167/91     Pulse Rate 05/22/20 2039 (!) 128     Resp 05/22/20 2039 20     Temp 05/22/20 2039 98.7 F (37.1 C)     Temp Source 05/22/20 2039 Oral     SpO2 05/22/20 2039 98 %     Weight 05/22/20 2040 127 lb 13.9 oz (58 kg)     Height 05/22/20 2040 5\' 5"  (1.651 m)     Head Circumference --      Peak Flow --      Pain Score 05/22/20 2040 0   Constitutional: Alert and oriented.  Eyes: Conjunctivae are normal.   ENT      Head: Normocephalic and atraumatic.      Nose: No congestion/rhinnorhea.      Mouth/Throat: Mucous membranes are moist.      Neck: No stridor. Respiratory: Normal respiratory effort without tachypnea nor retractions. Musculoskeletal: Normal range of motion in all extremities. Neurologic:  Normal speech and language. No gross focal neurologic deficits are appreciated.  Skin:  Skin is warm, dry and intact. No rash noted. Psychiatric: Yelling. Upset.   ____________________________________________    LABS (pertinent positives/negatives)  None  ____________________________________________   EKG  None  ____________________________________________    RADIOLOGY  None  ____________________________________________   PROCEDURES  Procedures  ____________________________________________   INITIAL IMPRESSION / ASSESSMENT AND PLAN / ED COURSE  Pertinent labs & imaging results that were available during my care of the patient were reviewed by me and considered in my medical decision making (see chart for details).   Patient presented to the emergency department from jail after concerns for possible passing out episode.  On exam patient is quite upset.  She is refusing any exam.  She does appear awake and alert.  She is oriented. At this time patient is refusing exam. Will discharge under police custody back to jail.   ____________________________________________   FINAL CLINICAL IMPRESSION(S) / ED DIAGNOSES  Final diagnoses:  Abnormal behavior     Note: This dictation was prepared with Dragon dictation. Any transcriptional errors that result from this process are unintentional     05/24/20, MD 05/22/20 2100

## 2020-05-22 NOTE — ED Notes (Signed)
Pt remains alert and belligerent with security and [employer]

## 2020-05-22 NOTE — ED Triage Notes (Signed)
Pt to ED via [facility], reports pt had a "normal arrest" but states when she got to the jail cell she began hitting her head against the wall and states she "knocked herself out." Pt A&Ox4 on arrival, tearful.

## 2020-05-22 NOTE — Discharge Instructions (Addendum)
Please be seen for any high fevers, chest pain, shortness of breath or any other new or concerning symptoms.

## 2021-10-14 ENCOUNTER — Emergency Department: Payer: Medicare HMO

## 2021-10-14 ENCOUNTER — Emergency Department
Admission: EM | Admit: 2021-10-14 | Discharge: 2021-10-14 | Disposition: A | Discharge status: Home / Self Care | Payer: Medicare HMO | Attending: Emergency Medicine | Admitting: Emergency Medicine

## 2021-10-14 DIAGNOSIS — R42 Dizziness and giddiness: Secondary | ICD-10-CM | POA: Insufficient documentation

## 2021-10-14 DIAGNOSIS — H538 Other visual disturbances: Secondary | ICD-10-CM | POA: Diagnosis present

## 2021-10-14 DIAGNOSIS — H539 Unspecified visual disturbance: Secondary | ICD-10-CM

## 2021-10-14 DIAGNOSIS — R4182 Altered mental status, unspecified: Secondary | ICD-10-CM | POA: Insufficient documentation

## 2021-10-14 LAB — URINALYSIS, ROUTINE W REFLEX MICROSCOPIC
Bilirubin Urine: NEGATIVE
Glucose, UA: NEGATIVE mg/dL
Hgb urine dipstick: NEGATIVE
Ketones, ur: NEGATIVE mg/dL
Leukocytes,Ua: NEGATIVE
Nitrite: NEGATIVE
Protein, ur: NEGATIVE mg/dL
Specific Gravity, Urine: 1.016 (ref 1.005–1.030)
pH: 7 (ref 5.0–8.0)

## 2021-10-14 LAB — CBC WITH DIFFERENTIAL/PLATELET
Abs Immature Granulocytes: 0.01 10*3/uL (ref 0.00–0.07)
Basophils Absolute: 0 10*3/uL (ref 0.0–0.1)
Basophils Relative: 1 %
Eosinophils Absolute: 0.1 10*3/uL (ref 0.0–0.5)
Eosinophils Relative: 2 %
HCT: 39.8 % (ref 36.0–46.0)
Hemoglobin: 12.5 g/dL (ref 12.0–15.0)
Immature Granulocytes: 0 %
Lymphocytes Relative: 25 %
Lymphs Abs: 1.1 10*3/uL (ref 0.7–4.0)
MCH: 30 pg (ref 26.0–34.0)
MCHC: 31.4 g/dL (ref 30.0–36.0)
MCV: 95.7 fL (ref 80.0–100.0)
Monocytes Absolute: 0.3 10*3/uL (ref 0.1–1.0)
Monocytes Relative: 6 %
Neutro Abs: 2.9 10*3/uL (ref 1.7–7.7)
Neutrophils Relative %: 66 %
Platelets: 221 10*3/uL (ref 150–400)
RBC: 4.16 MIL/uL (ref 3.87–5.11)
RDW: 12.8 % (ref 11.5–15.5)
WBC: 4.5 10*3/uL (ref 4.0–10.5)
nRBC: 0 % (ref 0.0–0.2)

## 2021-10-14 LAB — URINE DRUG SCREEN, QUALITATIVE (ARMC ONLY)
Amphetamines, Ur Screen: NOT DETECTED
Barbiturates, Ur Screen: NOT DETECTED
Benzodiazepine, Ur Scrn: NOT DETECTED
Cannabinoid 50 Ng, Ur ~~LOC~~: NOT DETECTED
Cocaine Metabolite,Ur ~~LOC~~: NOT DETECTED
MDMA (Ecstasy)Ur Screen: NOT DETECTED
Methadone Scn, Ur: NOT DETECTED
Opiate, Ur Screen: NOT DETECTED
Phencyclidine (PCP) Ur S: NOT DETECTED
Tricyclic, Ur Screen: NOT DETECTED

## 2021-10-14 LAB — COMPREHENSIVE METABOLIC PANEL
ALT: 18 U/L (ref 0–44)
AST: 27 U/L (ref 15–41)
Albumin: 3.7 g/dL (ref 3.5–5.0)
Alkaline Phosphatase: 73 U/L (ref 38–126)
Anion gap: 6 (ref 5–15)
BUN: 9 mg/dL (ref 6–20)
CO2: 25 mmol/L (ref 22–32)
Calcium: 9 mg/dL (ref 8.9–10.3)
Chloride: 109 mmol/L (ref 98–111)
Creatinine, Ser: 0.73 mg/dL (ref 0.44–1.00)
GFR, Estimated: >60 mL/min (ref >60)
Glucose, Bld: 92 mg/dL (ref 70–99)
Potassium: 4.1 mmol/L (ref 3.5–5.1)
Sodium: 140 mmol/L (ref 135–145)
Total Bilirubin: 0.6 mg/dL (ref 0.3–1.2)
Total Protein: 6.5 g/dL (ref 6.5–8.1)

## 2021-10-14 LAB — ETHANOL: Alcohol, Ethyl (B): <10 mg/dL (ref <10)

## 2021-10-14 LAB — CBG MONITORING, ED: Glucose-Capillary: 90 mg/dL (ref 70–99)

## 2021-10-14 MED ORDER — SODIUM CHLORIDE 0.9 % IV BOLUS
1000.0000 mL | Freq: Once | INTRAVENOUS | Status: AC
  Administered 2021-10-14: 1000 mL via INTRAVENOUS

## 2021-10-14 NOTE — Discharge Instructions (Signed)
Your MRI today was unremarkable and does not show any new changes.  Please continue to follow-up with your doctor for further evaluation of your symptoms.

## 2021-10-14 NOTE — ED Provider Notes (Signed)
[facility] Provider Note    Event Date/Time   First MD Initiated Contact with Patient 10/14/21 1303     (approximate)   History   Altered Mental Status (Patient was riding in the car with her husband when she began vomiting and became confused (no loss of consciousness per EMS); Patient is disoriented to time but does tell me that she had vision changes and her vision "started going up and down" then she became sick; GCS of 14, denies pain; CBG 90)   HPI  Patricia [name] is a 61 y.o. female   Past medical history of alcohol use now reportedly in remission, medical chart review also notes polysubstance use, depression and anxiety. Presents with transient altered mental status confusion and nausea with vision changes in the car while on her way to the alcohol Anonymous meeting.  Witnessed by her husband who was driving.  She felt that her vision was going "up-and-down" and has now mostly resolved.  Denies substance use or alcohol use.  Denies falls or recent illness.  No other associated symptoms.  Has a remote history of seizures when she was an adolescent and has had no seizures for the past many decades.  No seizure-like activity reported by husband, the patient denies loss of consciousness.  History was obtained via patient and her husband who is at bedside.      Physical Exam   Triage Vital Signs: ED Triage Vitals [10/14/21 1310]  Enc Vitals Group     BP 123/82     Pulse Rate 96     Resp 16     Temp 98.3 F (36.8 C)     Temp Source Oral     SpO2 100 %     Weight 136 lb 4.8 oz (61.8 kg)     Height 5\' 5"  (1.651 m)     Head Circumference      Peak Flow      Pain Score 0     Pain Loc      Pain Edu?      Excl. in GC?     Most recent vital signs: Vitals:   10/14/21 1310  BP: 123/82  Pulse: 96  Resp: 16  Temp: 98.3 F (36.8 C)  SpO2: 100%    General: Awake, no distress.  Speech is slowed, but per husband this is her  baseline. CV:  Good peripheral perfusion.  Resp:  Normal effort.  There to auscultation bilaterally. Abd:  No distention.  Nontender to palpation. Other:  No signs of head injury.  Neck is supple with full range of motion, nontoxic appearance, hemodynamics reviewed and within normal limits.  Patient is slow and careful with her gait, visual acuity is intact, and there is some slight dysmetria with finger-to-nose testing.  Otherwise she is moving all extremities, motor and sensory intact.  Extraocular movements intact without nystagmus   ED Results / Procedures / Treatments   Labs (all labs ordered are listed, but only abnormal results are displayed) Labs Reviewed  COMPREHENSIVE METABOLIC PANEL  ETHANOL  CBC WITH DIFFERENTIAL/PLATELET  URINE DRUG SCREEN, QUALITATIVE ([facility] ONLY)  URINALYSIS, ROUTINE W REFLEX MICROSCOPIC  CBG MONITORING, ED     I reviewed labs and they are notable for negative ethanol, no leukocytosis, no metabolic derangement.  EKG  ED ECG REPORT I, 10/16/21, the attending physician, personally viewed and interpreted this ECG.   Date: 10/14/2021  EKG Time: 1302  Rate: 94  Rhythm: normal EKG,  normal sinus rhythm, unchanged from previous tracings, normal sinus rhythm  Axis: Normal  Intervals:none  ST&T Change: No STEMI    RADIOLOGY I dependently reviewed and interpreted CT head and see no obvious bleeding or midline shift   PROCEDURES:  Critical Care performed: No  Procedures   MEDICATIONS ORDERED IN ED: Medications  sodium chloride 0.9 % bolus 1,000 mL (1,000 mLs Intravenous New Bag/Given 10/14/21 1433)    Consultants:  I spoke with neurology regarding care plan for this patient.   IMPRESSION / MDM / ASSESSMENT AND PLAN / ED COURSE  I reviewed the triage vital signs and the nursing notes.                              Differential diagnosis includes, but is not limited to, stroke, head bleed, metabolic derangement or infection, dehydration,  intoxication , seizure.  MDM: The patient with transient blurred vision that has now largely resolved.  No significant focal neurologic findings other than dysmetria which is mild.  I discussed this case with on-call neurologist given 2-hour since onset of symptoms, which have mostly resolved, minor residual symptoms now not a good candidate for thrombolytics.  Will assess for other inciting etiologies including metabolic or toxic, infectious.  Consider seizure though patient states that she has not had a seizure for decades there is a hospital note in 2017 noting a seizure while hospitalized.  Could be postictal with rapid recovery seen patient and her husband.  Plan is for work-up as above.  If no clear identifying cause is found by initial work-up, MRI brain r/o stroke    Patient's presentation is most consistent with acute presentation with potential threat to life or bodily function.       FINAL CLINICAL IMPRESSION(S) / ED DIAGNOSES   Final diagnoses:  Vision changes  Dizziness     Rx / DC Orders   ED Discharge Orders     None        Note:  This document was prepared using Dragon voice recognition software and may include unintentional dictation errors.    Pilar Jarvis, MD 10/14/21 1438

## 2022-12-28 ENCOUNTER — Encounter: Payer: Self-pay | Admitting: Podiatry

## 2022-12-28 ENCOUNTER — Other Ambulatory Visit: Payer: Self-pay | Admitting: Podiatry

## 2022-12-29 NOTE — Discharge Instructions (Signed)

## 2023-01-04 ENCOUNTER — Other Ambulatory Visit: Payer: Self-pay

## 2023-01-04 ENCOUNTER — Ambulatory Visit: Payer: Medicare HMO | Admitting: General Practice

## 2023-01-04 ENCOUNTER — Ambulatory Visit: Payer: Self-pay

## 2023-01-04 ENCOUNTER — Encounter
Admission: RE | Disposition: A | Discharge status: Home / Self Care | Payer: Self-pay | Source: Home / Self Care | Attending: Podiatry

## 2023-01-04 ENCOUNTER — Ambulatory Visit
Admission: RE | Admit: 2023-01-04 | Discharge: 2023-01-04 | Disposition: A | Discharge status: Home / Self Care | Payer: Medicare HMO | Attending: Podiatry | Admitting: Podiatry

## 2023-01-04 DIAGNOSIS — F17210 Nicotine dependence, cigarettes, uncomplicated: Secondary | ICD-10-CM | POA: Diagnosis not present

## 2023-01-04 DIAGNOSIS — F909 Attention-deficit hyperactivity disorder, unspecified type: Secondary | ICD-10-CM | POA: Insufficient documentation

## 2023-01-04 DIAGNOSIS — F419 Anxiety disorder, unspecified: Secondary | ICD-10-CM | POA: Diagnosis not present

## 2023-01-04 DIAGNOSIS — M2042 Other hammer toe(s) (acquired), left foot: Secondary | ICD-10-CM | POA: Diagnosis present

## 2023-01-04 DIAGNOSIS — F32A Depression, unspecified: Secondary | ICD-10-CM | POA: Diagnosis not present

## 2023-01-04 HISTORY — PX: FLEXOR TENOTOMY: SHX6342

## 2023-01-04 HISTORY — DX: Other specified behavioral and emotional disorders with onset usually occurring in childhood and adolescence: F98.8

## 2023-01-04 HISTORY — PX: ARTHRODESIS FOOT WITH WEIL OSTEOTOMY: SHX5589

## 2023-01-04 HISTORY — DX: Gastro-esophageal reflux disease without esophagitis: K21.9

## 2023-01-04 HISTORY — DX: Presence of dental prosthetic device (complete) (partial): Z97.2

## 2023-01-04 HISTORY — PX: HAMMER TOE SURGERY: SHX385

## 2023-01-04 HISTORY — DX: Presence of external hearing-aid: Z97.4

## 2023-01-04 SURGERY — ARTHRODESIS FOOT WITH WEIL OSTEOTOMY
Anesthesia: Choice | Site: Toe | Laterality: Left

## 2023-01-04 MED ORDER — MIDAZOLAM HCL 5 MG/5ML IJ SOLN
INTRAMUSCULAR | Status: DC | PRN
  Administered 2023-01-04: 2 mg via INTRAVENOUS

## 2023-01-04 MED ORDER — CEFAZOLIN SODIUM-DEXTROSE 2-3 GM-%(50ML) IV SOLR
INTRAVENOUS | Status: AC
  Filled 2023-01-04: qty 50

## 2023-01-04 MED ORDER — BUPIVACAINE LIPOSOME 1.3 % IJ SUSP
INTRAMUSCULAR | Status: DC | PRN
  Administered 2023-01-04: 10 mL

## 2023-01-04 MED ORDER — LIDOCAINE HCL (CARDIAC) PF 100 MG/5ML IV SOSY
PREFILLED_SYRINGE | INTRAVENOUS | Status: DC | PRN
  Administered 2023-01-04: 40 mg via INTRATRACHEAL

## 2023-01-04 MED ORDER — FENTANYL CITRATE (PF) 100 MCG/2ML IJ SOLN
INTRAMUSCULAR | Status: AC
  Filled 2023-01-04: qty 2

## 2023-01-04 MED ORDER — ONDANSETRON HCL 4 MG/2ML IJ SOLN
INTRAMUSCULAR | Status: DC | PRN
  Administered 2023-01-04: 4 mg via INTRAVENOUS

## 2023-01-04 MED ORDER — DEXAMETHASONE SODIUM PHOSPHATE 4 MG/ML IJ SOLN
INTRAMUSCULAR | Status: DC | PRN
  Administered 2023-01-04: 4 mg via INTRAVENOUS

## 2023-01-04 MED ORDER — OXYCODONE-ACETAMINOPHEN 5-325 MG PO TABS: 1.0000 | ORAL_TABLET | Freq: Four times a day (QID) | ORAL | 0 refills | Status: AC | PRN

## 2023-01-04 MED ORDER — BUPIVACAINE HCL (PF) 0.25 % IJ SOLN
INTRAMUSCULAR | Status: DC | PRN
  Administered 2023-01-04: 10 mL

## 2023-01-04 MED ORDER — MIDAZOLAM HCL 2 MG/2ML IJ SOLN
INTRAMUSCULAR | Status: AC
  Filled 2023-01-04: qty 2

## 2023-01-04 MED ORDER — PHENYLEPHRINE HCL (PRESSORS) 10 MG/ML IV SOLN
INTRAVENOUS | Status: DC | PRN
  Administered 2023-01-04 (×13): 100 ug via INTRAVENOUS

## 2023-01-04 MED ORDER — EPHEDRINE SULFATE (PRESSORS) 50 MG/ML IJ SOLN
INTRAMUSCULAR | Status: DC | PRN
  Administered 2023-01-04 (×2): 10 mg via INTRAVENOUS
  Administered 2023-01-04: 20 mg via INTRAVENOUS
  Administered 2023-01-04: 10 mg via INTRAVENOUS

## 2023-01-04 MED ORDER — PROPOFOL 10 MG/ML IV BOLUS
INTRAVENOUS | Status: DC | PRN
  Administered 2023-01-04: 150 mg via INTRAVENOUS

## 2023-01-04 MED ORDER — CEFAZOLIN SODIUM-DEXTROSE 2-4 GM/100ML-% IV SOLN
2.0000 g | INTRAVENOUS | Status: AC
  Administered 2023-01-04: 2 g via INTRAVENOUS

## 2023-01-04 MED ORDER — BUPIVACAINE LIPOSOME 1.3 % IJ SUSP
INTRAMUSCULAR | Status: AC
  Filled 2023-01-04: qty 10

## 2023-01-04 MED ORDER — FENTANYL CITRATE (PF) 100 MCG/2ML IJ SOLN
INTRAMUSCULAR | Status: DC | PRN
  Administered 2023-01-04: 50 ug via INTRAVENOUS

## 2023-01-04 SURGICAL SUPPLY — 42 items
APL SKNCLS STERI-STRIP NONHPOA (GAUZE/BANDAGES/DRESSINGS) ×2
BENZOIN TINCTURE PRP APPL 2/3 (GAUZE/BANDAGES/DRESSINGS) ×2 IMPLANT
BIT DRILL 1.7 LNG CANN (DRILL) IMPLANT
BLADE MINI RND TIP GREEN BEAV (BLADE) IMPLANT
BLADE OSC/SAGITTAL 5.5X25 (BLADE) IMPLANT
BLADE OSC/SAGITTAL MD 5.5X18 (BLADE) IMPLANT
BLADE SURG 15 STRL LF DISP TIS (BLADE) IMPLANT
BLADE SURG 15 STRL SS (BLADE) ×2
BNDG CMPR 5X4 CHSV STRCH STRL (GAUZE/BANDAGES/DRESSINGS) ×2
BNDG CMPR 75X41 PLY HI ABS (GAUZE/BANDAGES/DRESSINGS) ×2
BNDG COHESIVE 4X5 TAN STRL LF (GAUZE/BANDAGES/DRESSINGS) ×2 IMPLANT
BNDG ESMARCH 4 X 12 STRL LF (GAUZE/BANDAGES/DRESSINGS) ×2
BNDG ESMARCH 4X12 STRL LF (GAUZE/BANDAGES/DRESSINGS) ×2 IMPLANT
BNDG STRETCH 4X75 STRL LF (GAUZE/BANDAGES/DRESSINGS) ×2 IMPLANT
CANISTER SUCT 1200ML W/VALVE (MISCELLANEOUS) ×2 IMPLANT
CNTRSNK DRL 2 SCR (MISCELLANEOUS) IMPLANT
COUNTERSINK 2.0 (MISCELLANEOUS) ×2
COVER LIGHT HANDLE UNIVERSAL (MISCELLANEOUS) ×4 IMPLANT
DRAPE FLUOR MINI C-ARM 54X84 (DRAPES) ×2 IMPLANT
DURAPREP 26ML APPLICATOR (WOUND CARE) ×2 IMPLANT
ELECT REM PT RETURN 9FT ADLT (ELECTROSURGICAL) ×2
ELECTRODE REM PT RTRN 9FT ADLT (ELECTROSURGICAL) ×2 IMPLANT
GAUZE SPONGE 4X4 12PLY STRL (GAUZE/BANDAGES/DRESSINGS) ×2 IMPLANT
GAUZE XEROFORM 1X8 LF (GAUZE/BANDAGES/DRESSINGS) ×2 IMPLANT
GLOVE SRG 8 PF TXTR STRL LF DI (GLOVE) ×2 IMPLANT
GLOVE SURG ENC MOIS LTX SZ7.5 (GLOVE) ×2 IMPLANT
GLOVE SURG UNDER POLY LF SZ8 (GLOVE) ×2
GOWN SPEC L4 XLG W/TWL (GOWN DISPOSABLE) ×2 IMPLANT
GOWN STRL REUS W/ TWL LRG LVL3 (GOWN DISPOSABLE) ×2 IMPLANT
GOWN STRL REUS W/TWL LRG LVL3 (GOWN DISPOSABLE) ×2
K-WIRE DBL END TROCAR 6X.045 (WIRE) ×8
KIT TURNOVER KIT A (KITS) ×2 IMPLANT
KWIRE DBL END TROCAR 6X.045 (WIRE) IMPLANT
NS IRRIG 500ML POUR BTL (IV SOLUTION) ×2 IMPLANT
PACK EXTREMITY ARMC (MISCELLANEOUS) ×2 IMPLANT
PIN BALLS 3/8 F/.045 WIRE (MISCELLANEOUS) ×2 IMPLANT
SCREW HEADLESS SHRT THRD 2X12 (Screw) IMPLANT
STOCKINETTE IMPERVIOUS LG (DRAPES) ×2 IMPLANT
STRIP CLOSURE SKIN 1/4X4 (GAUZE/BANDAGES/DRESSINGS) ×2 IMPLANT
SUT ETHILON 3-0 (SUTURE) IMPLANT
SUT VIC AB 4-0 FS2 27 (SUTURE) IMPLANT
WIRE SMOOTH TROCAR .9MMX150MML (WIRE) IMPLANT

## 2023-01-04 NOTE — Anesthesia Preprocedure Evaluation (Addendum)
Anesthesia Evaluation  Patient identified by MRN, date of birth, ID band Patient awake    Reviewed: Allergy & Precautions, H&P , NPO status , Patient's Chart, lab work & pertinent test results  Airway Mallampati: II  TM Distance: >3 FB Neck ROM: Full    Dental no notable dental hx. (+) Upper Dentures   Pulmonary Current Smoker and Patient abstained from smoking.   Pulmonary exam normal breath sounds clear to auscultation       Cardiovascular negative cardio ROS Normal cardiovascular exam Rhythm:Regular Rate:Normal     Neuro/Psych Seizures -,  PSYCHIATRIC DISORDERS Anxiety Depression    negative neurological ROS  negative psych ROS   GI/Hepatic Neg liver ROS,GERD  ,,GERD does not awaken her at night, no recorded hiatal hernia   Endo/Other  negative endocrine ROS    Renal/GU negative Renal ROS  negative genitourinary   Musculoskeletal negative musculoskeletal ROS (+)    Abdominal   Peds negative pediatric ROS (+)  Hematology negative hematology ROS (+)   Anesthesia Other Findings Medical History  Aspiration into airway--in her 15s, when she was drinking  Depression Anxiety, very anxious today Smokes 3  cigarettes per day, none today  Alcohol abuse--sober 6 years GERD (gastroesophageal reflux disease) Seizure in her 83s, when drinking ADD (attention deficit disorder) Wears hearing aid in both ears Wears upper dentures     Reproductive/Obstetrics negative OB ROS                             Anesthesia Physical Anesthesia Plan  ASA: 3  Anesthesia Plan: General   Post-op Pain Management:    Induction: Intravenous  PONV Risk Score and Plan:   Airway Management Planned: Natural Airway and Nasal Cannula  Additional Equipment:   Intra-op Plan:   Post-operative Plan: Extubation in OR  Informed Consent: I have reviewed the patients History and Physical, chart, labs and  discussed the procedure including the risks, benefits and alternatives for the proposed anesthesia with the patient or authorized representative who has indicated his/her understanding and acceptance.     Dental Advisory Given  Plan Discussed with: Anesthesiologist, CRNA and Surgeon  Anesthesia Plan Comments: (Patient consented for risks of anesthesia including but not limited to:  - adverse reactions to medications - risk of airway placement if required - damage to eyes, teeth, lips or other oral mucosa - nerve damage due to positioning  - sore throat or hoarseness - Damage to heart, brain, nerves, lungs, other parts of body or loss of life  Patient voiced understanding and assent.)       Anesthesia Quick Evaluation

## 2023-01-04 NOTE — Anesthesia Procedure Notes (Signed)
Procedure Name: LMA Insertion Date/Time: 01/04/2023 12:41 PM  Performed by: Andee Poles, CRNAPre-anesthesia Checklist: Patient identified, Emergency Drugs available, Suction available, Timeout performed and Patient being monitored Patient Re-evaluated:Patient Re-evaluated prior to induction Oxygen Delivery Method: Circle system utilized Preoxygenation: Pre-oxygenation with 100% oxygen Induction Type: IV induction LMA: LMA inserted LMA Size: 4.0 Number of attempts: 1 Placement Confirmation: positive ETCO2 and breath sounds checked- equal and bilateral Tube secured with: Tape

## 2023-01-04 NOTE — Op Note (Signed)
Operative note   Surgeon:Biannca Scantlin [employer] forces logistics/support/administrative officer: None    Preop diagnosis: 1.  Contracture left fourth toe 2.  Contracture left fifth toe    Postop diagnosis: Same    Procedure: 1.  Weil osteotomy left fourth metatarsal 2.  Weil osteotomy left fifth metatarsal 3.  PIPJ arthroplasty left fourth toe 4.  PIPJ arthroplasty left fifth toe 5.  Open flexor tenotomy left fourth toe 6.  V-Y skin plasty left fourth toe 7.  V-Y skin plasty left fifth toe 8.  Intraoperative fluoroscopy use without assistance of radiologist    EBL: Minimal    Anesthesia:local and general    Hemostasis: Midcalf tourniquet inflated to 200 mmHg for 60 minutes    Specimen: None    Complications: None    Operative indications:Patricia [name] is an 62 y.o. that presents today for surgical intervention.  The risks/benefits/alternatives/complications have been discussed and consent has been given.    Procedure:  Patient was brought into the OR and placed on the operating table in thesupine position. After anesthesia was obtained theleft lower extremity was prepped and draped in usual sterile fashion.  Attention was directed to the dorsal aspect of the left foot where vita Y skin's incisions were made coursing from the MTPJ of the fourth and fifth MTPJ's to the PIPJ of the fourth fifth toes.  Sharp and blunt dissection carried down through the subcutaneous tissue.  The extensor tendons were noted and Z-lengthening of the extensor tendons were performed.  Next I exposed the head of the proximal phalanx and base of middle phalanx of the fourth and fifth toes.  The dorsal medial and lateral capsules of the fourth and fifth MTPJ's were relieved.  The plantar plate was then released.  Better realignment of the toes were noted with mild residual contractures.  At this time Weil osteotomy was created from distal to proximal.  The capital fragments were translocated proximal.  These were stabilized with headless 2.0 mm Paragon  screws.  The head of the proximal phalanx was removed of the articular cartilage.  There was still noted to be mild residual contracture of the fourth toe.  An open flexor tenotomy was performed from the plantar aspect of the DIPJ site.  Sharp and blunt dissection carried down to the tendon and this was then released.  Good realignment of the toes were noted.  0.045 K wires were driven from the base of the middle phalanx through the tip of the toe and retrograded back to the proximal phalanx crossing into the MTPJ to both the fourth and fifth toes.  Good realignment was noted.  All wounds were flushed with copious amounts of irrigation.  The Z-lengthening extensor tendons were reanastomosed.  The subcutaneous tissue closed with a 4-0 Vicryl and the skin closed with 3 oh nylons throughout.  Intraoperative fluoroscopy was used throughout the procedure to evaluate all cuts and realignment.    Patient tolerated the procedure and anesthesia well.  Was transported from the OR to the PACU with all vital signs stable and vascular status intact. To be discharged per routine protocol.  Will follow up in approximately 1 week in the outpatient clinic.

## 2023-01-04 NOTE — H&P (Signed)
HISTORY AND PHYSICAL INTERVAL NOTE:  01/04/2023  12:24 PM  Patricia [name]  has presented today for surgery, with the diagnosis of M79.672 - Left foot pain M20.42 - Hammertoe of left foot.  The various methods of treatment have been discussed with the patient.  No guarantees were given.  After consideration of risks, benefits and other options for treatment, the patient has consented to surgery.  I have reviewed the patients' chart and labs.     [name] history and physical examination was performed in my office.  The patient was reexamined.  There have been no changes to this history and physical examination.  Patricia [name]

## 2023-01-04 NOTE — Anesthesia Postprocedure Evaluation (Signed)
Anesthesia Post Note  Patient: Patricia [name]  Procedure(s) Performed: ARTHRODESIS FOOT WITH WEIL OSTEOTOMY x 2 (Left) HAMMER TOE CORRECTION x 2 (Left: Toe) FLEXOR TENOTOMY LEFT 4TH TOE (Left: Toe)  Patient location during evaluation: PACU Anesthesia Type: General Level of consciousness: awake and alert Pain management: pain level controlled Vital Signs Assessment: post-procedure vital signs reviewed and stable Respiratory status: spontaneous breathing, nonlabored ventilation, respiratory function stable and patient connected to nasal cannula oxygen Cardiovascular status: blood pressure returned to baseline and stable Postop Assessment: no apparent nausea or vomiting Anesthetic complications: no   No notable events documented.   Last Vitals:  Vitals:   01/04/23 1425 01/04/23 1427  BP: 110/67 110/67  Pulse: 80 80  Resp: (!) 22 (!) 36  Temp:    SpO2: 100% 100%    Last Pain:  Vitals:   01/04/23 1430  TempSrc:   PainSc: 0-No pain                 Nancy Arvin C Dejuana Weist

## 2023-01-04 NOTE — Transfer of Care (Signed)
Immediate Anesthesia Transfer of Care Note  Patient: Patricia [name]  Procedure(s) Performed: ARTHRODESIS FOOT WITH WEIL OSTEOTOMY x 2 (Left) HAMMER TOE CORRECTION x 2 (Left: Toe) CAPSULOTOMY METATARSOPHALANGEAL x 2 (Left)  Patient Location: PACU  Anesthesia Type: No value filed.  Level of Consciousness: awake, alert  and patient cooperative  Airway and Oxygen Therapy: Patient Spontanous Breathing and Patient connected to supplemental oxygen  Post-op Assessment: Post-op Vital signs reviewed, Patient's Cardiovascular Status Stable, Respiratory Function Stable, Patent Airway and No signs of Nausea or vomiting  Post-op Vital Signs: Reviewed and stable  Complications: No notable events documented.

## 2023-01-09 ENCOUNTER — Encounter: Payer: Self-pay | Admitting: Podiatry
